# Patient Record
Sex: Male | Born: 1970 | Race: White | Hispanic: No | Marital: Married | Smoking: Never smoker
Health system: Southern US, Community
[De-identification: ages and names within clinical notes are randomized; demographics above are authoritative.]

## PROBLEM LIST (undated history)

## (undated) DIAGNOSIS — I10 Essential (primary) hypertension: Secondary | ICD-10-CM

## (undated) DIAGNOSIS — E079 Disorder of thyroid, unspecified: Secondary | ICD-10-CM

## (undated) DIAGNOSIS — M1711 Unilateral primary osteoarthritis, right knee: Secondary | ICD-10-CM

## (undated) DIAGNOSIS — K219 Gastro-esophageal reflux disease without esophagitis: Secondary | ICD-10-CM

## (undated) DIAGNOSIS — M1990 Unspecified osteoarthritis, unspecified site: Secondary | ICD-10-CM

## (undated) DIAGNOSIS — E291 Testicular hypofunction: Secondary | ICD-10-CM

## (undated) DIAGNOSIS — E785 Hyperlipidemia, unspecified: Secondary | ICD-10-CM

## (undated) DIAGNOSIS — I2510 Atherosclerotic heart disease of native coronary artery without angina pectoris: Secondary | ICD-10-CM

## (undated) DIAGNOSIS — R12 Heartburn: Secondary | ICD-10-CM

## (undated) HISTORY — DX: Disorder of thyroid, unspecified: E07.9

## (undated) HISTORY — DX: Testicular hypofunction: E29.1

## (undated) HISTORY — DX: Essential (primary) hypertension: I10

## (undated) HISTORY — DX: Atherosclerotic heart disease of native coronary artery without angina pectoris: I25.10

## (undated) HISTORY — PX: JOINT REPLACEMENT: SHX530

## (undated) HISTORY — DX: Unilateral primary osteoarthritis, right knee: M17.11

## (undated) HISTORY — DX: Heartburn: R12

## (undated) HISTORY — DX: Unspecified osteoarthritis, unspecified site: M19.90

## (undated) HISTORY — DX: Hyperlipidemia, unspecified: E78.5

---

## 2006-12-11 HISTORY — PX: KNEE ARTHROSCOPY W/ MENISCECTOMY: SHX1879

## 2007-10-03 ENCOUNTER — Ambulatory Visit: Payer: Self-pay | Admitting: Specialist

## 2009-12-11 HISTORY — PX: KNEE ARTHROSCOPY W/ MENISCECTOMY: SHX1879

## 2010-05-21 ENCOUNTER — Emergency Department: Payer: Self-pay | Admitting: Emergency Medicine

## 2010-05-26 ENCOUNTER — Ambulatory Visit: Payer: Self-pay | Admitting: Sports Medicine

## 2010-06-09 ENCOUNTER — Ambulatory Visit: Payer: Self-pay | Admitting: Orthopedic Surgery

## 2012-10-16 ENCOUNTER — Ambulatory Visit: Payer: Self-pay | Admitting: Orthopedic Surgery

## 2012-10-21 ENCOUNTER — Ambulatory Visit: Payer: Self-pay | Admitting: Orthopedic Surgery

## 2012-11-12 ENCOUNTER — Other Ambulatory Visit: Payer: Self-pay | Admitting: Physician Assistant

## 2012-11-12 ENCOUNTER — Encounter: Payer: Self-pay | Admitting: Physician Assistant

## 2012-11-12 DIAGNOSIS — E1159 Type 2 diabetes mellitus with other circulatory complications: Secondary | ICD-10-CM | POA: Insufficient documentation

## 2012-11-12 DIAGNOSIS — M1711 Unilateral primary osteoarthritis, right knee: Secondary | ICD-10-CM

## 2012-11-12 DIAGNOSIS — M170 Bilateral primary osteoarthritis of knee: Secondary | ICD-10-CM | POA: Insufficient documentation

## 2012-11-12 DIAGNOSIS — I10 Essential (primary) hypertension: Secondary | ICD-10-CM

## 2012-11-12 NOTE — H&P (Signed)
Kenneth Sherman is an 41 y.o. male.   Chief Complaint: right knee djd HPI: Kenneth Sherman is a 41 year old seen for follow-up from his persistent significant right knee pain. He is 2 years status post right knee arthroscopy for partial meniscectomy Dr. Menz in [place] but has had significant increasing pain. He was found on x-rays to have medial compartment bone on bone and an MRI done on 10/21/12 showed medial compartment bone on bone with no other significant bony or joint abnormality. He has pain on a daily basis. Pain with weightbearing and activity relieved by rest. He does not take any regular medications.   Past Medical History  Diagnosis Date  . Hypertension   . Right knee DJD     Past Surgical History  Procedure Date  . Knee arthroscopy w/ meniscectomy 2008    left  . Knee arthroscopy w/ meniscectomy 2011    right    Family History  Problem Relation Age of Onset  . Heart attack Mother   . Hypertension Mother   . Hypertension Father   . Hypertension Brother   . Heart attack Father   . Heart attack Father    Social History:  reports that he has never smoked. He does not have any smokeless tobacco history on file. He reports that he drinks alcohol. His drug history not on file.  Allergies: No Known Allergies   (Not in a hospital admission)  No results found for this or any previous visit (from the past 48 hour(s)). No results found.  Review of Systems  Constitutional: Negative.   HENT: Negative.   Eyes: Negative.   Respiratory: Negative.   Cardiovascular: Negative.   Gastrointestinal: Negative.   Genitourinary: Negative.   Musculoskeletal: Positive for joint pain.       Right knee pain  Skin: Negative.   Neurological: Negative.   Psychiatric/Behavioral: Negative.     Blood pressure 146/93, pulse 76, temperature 98.6 F (37 C), height 5' 11" (1.803 m), weight 116.121 kg (256 lb), SpO2 97.00%. Physical Exam  Constitutional: He is oriented to person, place, and  time. He appears well-developed and well-nourished.  HENT:  Head: Normocephalic and atraumatic.  Eyes: Conjunctivae normal are normal. Pupils are equal, round, and reactive to light.  Neck: Normal range of motion. Neck supple.  Cardiovascular: Normal rate and regular rhythm.   Respiratory: Effort normal.  GI: Soft.  Genitourinary:       Not pertinent to current symptomatology therefore not examined.  Musculoskeletal:       Well developed well nourished white male in no acute distress. Alert and oriented. Height 5'11" weight 248 pounds. Blood pressure is 156/92. Examination of his right knee reveals pain medially, 1+ crepitation 1+ synovitis pain on McMurray's testing. Mild varus deformity with normal patella tracking. Exam of the left knee reveals range of motion without pain swelling weakness or instability. Vascular exam: pulses 2+ and symmetric.  Neurological: He is alert and oriented to person, place, and time. He has normal reflexes.  Skin: Skin is warm and dry.  Psychiatric: He has a normal mood and affect. His behavior is normal. Judgment and thought content normal.     Assessment Patient Active Problem List  Diagnosis  . Hypertension  . Right knee DJD     Plan I talk to him and his wife about this in detail. I do think he's a candidate for right knee unicompartmental arthroplasty due to findings on x-rays MRI and lack of response to conservative care.   Discussed risks benefits and possible complications of the surgery in detail and he understands this completely.   Kynzleigh Bandel J 11/12/2012, 4:01 PM    

## 2012-11-14 ENCOUNTER — Ambulatory Visit (INDEPENDENT_AMBULATORY_CARE_PROVIDER_SITE_OTHER): Payer: BC Managed Care – PPO

## 2012-11-14 ENCOUNTER — Encounter: Payer: Self-pay | Admitting: Internal Medicine

## 2012-11-14 ENCOUNTER — Ambulatory Visit (INDEPENDENT_AMBULATORY_CARE_PROVIDER_SITE_OTHER): Payer: BC Managed Care – PPO | Admitting: Internal Medicine

## 2012-11-14 VITALS — BP 126/78 | HR 75 | Temp 98.6°F | Ht 71.0 in | Wt 252.0 lb

## 2012-11-14 DIAGNOSIS — Z862 Personal history of diseases of the blood and blood-forming organs and certain disorders involving the immune mechanism: Secondary | ICD-10-CM

## 2012-11-14 DIAGNOSIS — E119 Type 2 diabetes mellitus without complications: Secondary | ICD-10-CM

## 2012-11-14 DIAGNOSIS — Z0000 Encounter for general adult medical examination without abnormal findings: Secondary | ICD-10-CM

## 2012-11-14 DIAGNOSIS — Z8639 Personal history of other endocrine, nutritional and metabolic disease: Secondary | ICD-10-CM

## 2012-11-14 DIAGNOSIS — Z131 Encounter for screening for diabetes mellitus: Secondary | ICD-10-CM

## 2012-11-14 DIAGNOSIS — Z13220 Encounter for screening for lipoid disorders: Secondary | ICD-10-CM

## 2012-11-14 DIAGNOSIS — Z130 Encounter for screening for diseases of the blood and blood-forming organs and certain disorders involving the immune mechanism: Secondary | ICD-10-CM

## 2012-11-14 DIAGNOSIS — Z125 Encounter for screening for malignant neoplasm of prostate: Secondary | ICD-10-CM

## 2012-11-14 DIAGNOSIS — Z23 Encounter for immunization: Secondary | ICD-10-CM

## 2012-11-14 DIAGNOSIS — Z136 Encounter for screening for cardiovascular disorders: Secondary | ICD-10-CM

## 2012-11-14 LAB — CBC
MCV: 84.7 fl (ref 78.0–100.0)
Platelets: 288 10*3/uL (ref 150.0–400.0)
RBC: 5.13 Mil/uL (ref 4.22–5.81)

## 2012-11-14 LAB — LIPID PANEL
Cholesterol: 218 mg/dL — ABNORMAL HIGH (ref 0–200)
HDL: 41.1 mg/dL (ref >39.00)
Total CHOL/HDL Ratio: 5
Triglycerides: 89 mg/dL (ref 0.0–149.0)

## 2012-11-14 LAB — BASIC METABOLIC PANEL
Calcium: 9.3 mg/dL (ref 8.4–10.5)
GFR: 81.31 mL/min (ref >60.00)
Sodium: 139 mEq/L (ref 135–145)

## 2012-11-14 NOTE — Progress Notes (Signed)
HPI  Pt presents to the clinic today to establish care. He has no concerns other than he needs surgical clearance for a partial knee replacement on 11/29/2012 which will be performed by Dr. Thurston Hole in [place].  Past Medical History  Diagnosis Date  . Hypertension   . Right knee DJD     No current outpatient prescriptions on file.    No Known Allergies  Family History  Problem Relation Age of Onset  . Heart attack Mother   . Hypertension Mother   . Hypertension Father   . Hypertension Brother   . Heart attack Father   . Heart attack Father     History   Social History  . Marital Status: Married    Spouse Name: N/A    Number of Children: N/A  . Years of Education: 14   Occupational History  . CAD Designer    Social History Main Topics  . Smoking status: Never Smoker   . Smokeless tobacco: Not on file  . Alcohol Use: 1.2 oz/week    1 Cans of beer, 1 Glasses of wine per week     Comment: occasionally  . Drug Use: No  . Sexually Active: Yes    Birth Control/ Protection: Condom   Other Topics Concern  . Not on file   Social History Narrative   Regular exercise-noCaffeine Use-yes    ROS:  Constitutional: Denies fever, malaise, fatigue, headache or abrupt weight changes.  HEENT: Denies eye pain, eye redness, ear pain, ringing in the ears, wax buildup, runny nose, nasal congestion, bloody nose, or sore throat. Respiratory: Denies difficulty breathing, shortness of breath, cough or sputum production.   Cardiovascular: Denies chest pain, chest tightness, palpitations or swelling in the hands or feet.  Gastrointestinal: Denies abdominal pain, bloating, constipation, diarrhea or blood in the stool.  GU: Denies frequency, urgency, pain with urination, blood in urine, odor or discharge. Musculoskeletal: Pt reports right knee pain and swelling. Denies decrease in range of motion, difficulty with gait, muscle pain.  Skin: Denies redness, rashes, lesions or ulcercations.   Neurological: Denies dizziness, difficulty with memory, difficulty with speech or problems with balance and coordination.   No other specific complaints in a complete review of systems (except as listed in HPI above).  PE:  BP 126/78  Pulse 75  Temp 98.6 F (37 C) (Oral)  Ht 5\' 11"  (1.803 m)  Wt 252 lb (114.306 kg)  BMI 35.15 kg/m2  SpO2 96% Wt Readings from Last 3 Encounters:  11/14/12 252 lb (114.306 kg)  11/12/12 256 lb (116.121 kg)    General: Appears his stated age, overweight but well developed, well nourished in NAD. HEENT: Head: normal shape and size; Eyes: sclera white, no icterus, conjunctiva pink, PERRLA and EOMs intact; Ears: Tm's gray and intact, normal light reflex; Nose: mucosa pink and moist, septum midline; Throat/Mouth: Teeth present, mucosa pink and moist, no lesions or ulcerations noted.  Neck: Normal range of motion. Neck supple, trachea midline. No massses, lumps or thyromegaly present.  Cardiovascular: Normal rate and rhythm. S1,S2 noted.  No murmur, rubs or gallops noted. No JVD or BLE edema. No carotid bruits noted. Pulmonary/Chest: Normal effort and positive vesicular breath sounds. No respiratory distress. No wheezes, rales or ronchi noted.  Abdomen: Soft and nontender. Normal bowel sounds, no bruits noted. No distention or masses noted. Liver, spleen and kidneys non palpable. Musculoskeletal: Normal range of motion. Moderate swelling of the right knee noted. Crepitus with ROM of the right knee. No  difficulty with gait. Strength 5/5 bilaterally. Neurological: Alert and oriented. Cranial nerves II-XII intact. Coordination normal. +DTRs bilaterally. Psychiatric: Mood and affect normal. Behavior is normal. Judgment and thought content normal.     Assessment and Plan:  Preventative Health Maintenance:  Will obtain basic screening labs CBC, BMET, lipids, PSA and testosterone EKG today- normal Tdap vaccine today Pt declines vaccine Pt medically cleared  for surgery pending lab work.  RTC in 1 year or sooner if needed.

## 2012-11-14 NOTE — Patient Instructions (Signed)
Health Maintenance, Males A healthy lifestyle and preventative care can promote health and wellness.  Maintain regular health, dental, and eye exams.  Eat a healthy diet. Foods like vegetables, fruits, whole grains, low-fat dairy products, and lean protein foods contain the nutrients you need without too many calories. Decrease your intake of foods high in solid fats, added sugars, and salt. Get information about a proper diet from your caregiver, if necessary.  Regular physical exercise is one of the most important things you can do for your health. Most adults should get at least 150 minutes of moderate-intensity exercise (any activity that increases your heart rate and causes you to sweat) each week. In addition, most adults need muscle-strengthening exercises on 2 or more days a week.   Maintain a healthy weight. The body mass index (BMI) is a screening tool to identify possible weight problems. It provides an estimate of body fat based on height and weight. Your caregiver can help determine your BMI, and can help you achieve or maintain a healthy weight. For adults 20 years and older:  A BMI below 18.5 is considered underweight.  A BMI of 18.5 to 24.9 is normal.  A BMI of 25 to 29.9 is considered overweight.  A BMI of 30 and above is considered obese.  Maintain normal blood lipids and cholesterol by exercising and minimizing your intake of saturated fat. Eat a balanced diet with plenty of fruits and vegetables. Blood tests for lipids and cholesterol should begin at age 20 and be repeated every 5 years. If your lipid or cholesterol levels are high, you are over 50, or you are a high risk for heart disease, you may need your cholesterol levels checked more frequently.Ongoing high lipid and cholesterol levels should be treated with medicines, if diet and exercise are not effective.  If you smoke, find out from your caregiver how to quit. If you do not use tobacco, do not start.  If you  choose to drink alcohol, do not exceed 2 drinks per day. One drink is considered to be 12 ounces (355 mL) of beer, 5 ounces (148 mL) of wine, or 1.5 ounces (44 mL) of liquor.  Avoid use of street drugs. Do not share needles with anyone. Ask for help if you need support or instructions about stopping the use of drugs.  High blood pressure causes heart disease and increases the risk of stroke. Blood pressure should be checked at least every 1 to 2 years. Ongoing high blood pressure should be treated with medicines if weight loss and exercise are not effective.  If you are 45 to 42 years old, ask your caregiver if you should take aspirin to prevent heart disease.  Diabetes screening involves taking a blood sample to check your fasting blood sugar level. This should be done once every 3 years, after age 45, if you are within normal weight and without risk factors for diabetes. Testing should be considered at a younger age or be carried out more frequently if you are overweight and have at least 1 risk factor for diabetes.  Colorectal cancer can be detected and often prevented. Most routine colorectal cancer screening begins at the age of 50 and continues through age 75. However, your caregiver may recommend screening at an earlier age if you have risk factors for colon cancer. On a yearly basis, your caregiver may provide home test kits to check for hidden blood in the stool. Use of a small camera at the end of a tube,   to directly examine the colon (sigmoidoscopy or colonoscopy), can detect the earliest forms of colorectal cancer. Talk to your caregiver about this at age 50, when routine screening begins. Direct examination of the colon should be repeated every 5 to 10 years through age 75, unless early forms of pre-cancerous polyps or small growths are found.  Hepatitis C blood testing is recommended for all people born from 1945 through 1965 and any individual with known risks for hepatitis C.  Healthy  men should no longer receive prostate-specific antigen (PSA) blood tests as part of routine cancer screening. Consult with your caregiver about prostate cancer screening.  Testicular cancer screening is not recommended for adolescents or adult males who have no symptoms. Screening includes self-exam, caregiver exam, and other screening tests. Consult with your caregiver about any symptoms you have or any concerns you have about testicular cancer.  Practice safe sex. Use condoms and avoid high-risk sexual practices to reduce the spread of sexually transmitted infections (STIs).  Use sunscreen with a sun protection factor (SPF) of 30 or greater. Apply sunscreen liberally and repeatedly throughout the day. You should seek shade when your shadow is shorter than you. Protect yourself by wearing long sleeves, pants, a wide-brimmed hat, and sunglasses year round, whenever you are outdoors.  Notify your caregiver of new moles or changes in moles, especially if there is a change in shape or color. Also notify your caregiver if a mole is larger than the size of a pencil eraser.  A one-time screening for abdominal aortic aneurysm (AAA) and surgical repair of large AAAs by sound wave imaging (ultrasonography) is recommended for ages 65 to 75 years who are current or former smokers.  Stay current with your immunizations. Document Released: 05/25/2008 Document Revised: 02/19/2012 Document Reviewed: 04/24/2011 [employer] Patient Information 2013 [employer].  

## 2012-11-14 NOTE — Addendum Note (Signed)
Addended by: Brenton Grills C on: 11/14/2012 10:55 AM   Modules accepted: Orders

## 2012-11-15 ENCOUNTER — Encounter: Payer: Self-pay | Admitting: Internal Medicine

## 2012-11-15 LAB — TESTOSTERONE, FREE, TOTAL, SHBG
Sex Hormone Binding: 16 nmol/L (ref 13–71)
Testosterone: 211.92 ng/dL — ABNORMAL LOW (ref 300–890)

## 2012-11-15 LAB — HEMOGLOBIN A1C: Hgb A1c MFr Bld: 5.9 % (ref 4.6–6.5)

## 2012-11-18 ENCOUNTER — Other Ambulatory Visit: Payer: Self-pay | Admitting: Internal Medicine

## 2012-11-18 MED ORDER — TESTOSTERONE 20.25 MG/ACT (1.62%) TD GEL: 2.0000 | Freq: Every day | TRANSDERMAL | Status: DC

## 2012-11-18 NOTE — Progress Notes (Signed)
Testosterone low, androgel restarted. RX placed

## 2012-11-19 ENCOUNTER — Encounter (HOSPITAL_COMMUNITY): Payer: Self-pay | Admitting: Pharmacy Technician

## 2012-11-20 ENCOUNTER — Encounter (HOSPITAL_COMMUNITY)
Admission: RE | Admit: 2012-11-20 | Discharge: 2012-11-20 | Disposition: A | Discharge status: Home / Self Care | Payer: BC Managed Care – PPO | Source: Ambulatory Visit | Attending: Physician Assistant | Admitting: Physician Assistant

## 2012-11-20 ENCOUNTER — Encounter (HOSPITAL_COMMUNITY)
Admission: RE | Admit: 2012-11-20 | Discharge: 2012-11-20 | Disposition: A | Discharge status: Home / Self Care | Payer: BC Managed Care – PPO | Source: Ambulatory Visit | Attending: Orthopedic Surgery | Admitting: Orthopedic Surgery

## 2012-11-20 ENCOUNTER — Encounter (HOSPITAL_COMMUNITY): Payer: Self-pay

## 2012-11-20 LAB — COMPREHENSIVE METABOLIC PANEL
ALT: 24 U/L (ref 0–53)
AST: 22 U/L (ref 0–37)
Albumin: 4 g/dL (ref 3.5–5.2)
Alkaline Phosphatase: 79 U/L (ref 39–117)
Chloride: 103 mEq/L (ref 96–112)
Creatinine, Ser: 1.1 mg/dL (ref 0.50–1.35)
Potassium: 3.8 mEq/L (ref 3.5–5.1)
Sodium: 139 mEq/L (ref 135–145)
Total Bilirubin: 0.4 mg/dL (ref 0.3–1.2)

## 2012-11-20 LAB — CBC WITH DIFFERENTIAL/PLATELET
Basophils Absolute: 0 10*3/uL (ref 0.0–0.1)
Basophils Relative: 0 % (ref 0–1)
Lymphocytes Relative: 36 % (ref 12–46)
MCHC: 35.5 g/dL (ref 30.0–36.0)
Neutro Abs: 3.4 10*3/uL (ref 1.7–7.7)
Neutrophils Relative %: 51 % (ref 43–77)
Platelets: 252 10*3/uL (ref 150–400)
RDW: 13.1 % (ref 11.5–15.5)
WBC: 6.7 10*3/uL (ref 4.0–10.5)

## 2012-11-20 LAB — SURGICAL PCR SCREEN
MRSA, PCR: NEGATIVE
Staphylococcus aureus: NEGATIVE

## 2012-11-20 LAB — PROTIME-INR: INR: 0.99 (ref 0.00–1.49)

## 2012-11-20 LAB — URINALYSIS, ROUTINE W REFLEX MICROSCOPIC
Ketones, ur: NEGATIVE mg/dL
Leukocytes, UA: NEGATIVE
Nitrite: NEGATIVE
pH: 5 (ref 5.0–8.0)

## 2012-11-20 LAB — APTT: aPTT: 30 seconds (ref 24–37)

## 2012-11-20 LAB — TYPE AND SCREEN

## 2012-11-20 MED ORDER — CHLORHEXIDINE GLUCONATE 4 % EX LIQD: 60.0000 mL | Freq: Once | CUTANEOUS | Status: DC

## 2012-11-20 NOTE — Pre-Procedure Instructions (Signed)
20 Breckan Cafiero  11/20/2012   Your procedure is scheduled on:  11/29/12  Report to [facility] at 800 AM.  Call this number if you have problems the morning of surgery: [phone]   Remember:   Do not eat food:After Midnight.    Take these medicines the morning of surgery with A SIP OF WATER: none   Do not wear jewelry, make-up or nail polish.  Do not wear lotions, powders, or perfumes. You may wear deodorant.  Do not shave 48 hours prior to surgery. Men may shave face and neck.  Do not bring valuables to the hospital.  Contacts, dentures or bridgework may not be worn into surgery.  Leave suitcase in the car. After surgery it may be brought to your room.  For patients admitted to the hospital, checkout time is 11:00 AM the day of discharge.   Patients discharged the day of surgery will not be allowed to drive home.  Name and phone number of your driver: family  Special Instructions: Shower using CHG 2 nights before surgery and the night before surgery.  If you shower the day of surgery use CHG.  Use special wash - you have one bottle of CHG for all showers.  You should use approximately 1/3 of the bottle for each shower.   Please read over the following fact sheets that you were given: Pain Booklet, Coughing and Deep Breathing, Blood Transfusion Information, Total Joint Packet, MRSA Information and Surgical Site Infection Prevention

## 2012-11-21 LAB — URINE CULTURE

## 2012-11-28 MED ORDER — CEFAZOLIN SODIUM 10 G IJ SOLR
3.0000 g | INTRAMUSCULAR | Status: AC
  Administered 2012-11-29: 3 g via INTRAVENOUS
  Filled 2012-11-28: qty 3000

## 2012-11-28 NOTE — Progress Notes (Addendum)
Left voice mail message on pts phone re: surgery time change.  Informed him that he was to arrive at 0530 instead of  0800.  Instructed pt to call Sec A to verify receipt of message. Pt returned call and stated understanding of arrival time.

## 2012-11-29 ENCOUNTER — Inpatient Hospital Stay (HOSPITAL_COMMUNITY)
Admission: RE | Admit: 2012-11-29 | Discharge: 2012-11-30 | DRG: 209 | Disposition: A | Discharge status: Home Health | Payer: BC Managed Care – PPO | Source: Ambulatory Visit | Attending: Orthopedic Surgery | Admitting: Orthopedic Surgery

## 2012-11-29 ENCOUNTER — Ambulatory Visit (HOSPITAL_COMMUNITY): Payer: BC Managed Care – PPO | Admitting: Anesthesiology

## 2012-11-29 ENCOUNTER — Encounter (HOSPITAL_COMMUNITY): Payer: Self-pay | Admitting: Anesthesiology

## 2012-11-29 ENCOUNTER — Encounter (HOSPITAL_COMMUNITY)
Admission: RE | Disposition: A | Discharge status: Home Health | Payer: Self-pay | Source: Ambulatory Visit | Attending: Orthopedic Surgery

## 2012-11-29 DIAGNOSIS — M1710 Unilateral primary osteoarthritis, unspecified knee: Principal | ICD-10-CM | POA: Diagnosis present

## 2012-11-29 DIAGNOSIS — M170 Bilateral primary osteoarthritis of knee: Secondary | ICD-10-CM | POA: Diagnosis present

## 2012-11-29 DIAGNOSIS — I10 Essential (primary) hypertension: Secondary | ICD-10-CM | POA: Diagnosis present

## 2012-11-29 DIAGNOSIS — Z8249 Family history of ischemic heart disease and other diseases of the circulatory system: Secondary | ICD-10-CM

## 2012-11-29 DIAGNOSIS — M1711 Unilateral primary osteoarthritis, right knee: Secondary | ICD-10-CM

## 2012-11-29 DIAGNOSIS — E1159 Type 2 diabetes mellitus with other circulatory complications: Secondary | ICD-10-CM | POA: Diagnosis present

## 2012-11-29 HISTORY — PX: PARTIAL KNEE ARTHROPLASTY: SHX2174

## 2012-11-29 SURGERY — ARTHROPLASTY, KNEE, UNICOMPARTMENTAL
Anesthesia: General | Site: Knee | Laterality: Right | Wound class: Clean

## 2012-11-29 MED ORDER — ZOLPIDEM TARTRATE 5 MG PO TABS
5.0000 mg | ORAL_TABLET | Freq: Every evening | ORAL | Status: DC | PRN
  Administered 2012-11-29: 5 mg via ORAL
  Filled 2012-11-29: qty 1

## 2012-11-29 MED ORDER — MIDAZOLAM HCL 5 MG/5ML IJ SOLN
INTRAMUSCULAR | Status: DC | PRN
  Administered 2012-11-29: 2 mg via INTRAVENOUS

## 2012-11-29 MED ORDER — OXYCODONE HCL 5 MG PO TABS
5.0000 mg | ORAL_TABLET | ORAL | Status: DC | PRN
  Administered 2012-11-30: 10 mg via ORAL
  Filled 2012-11-29: qty 2

## 2012-11-29 MED ORDER — ALUM & MAG HYDROXIDE-SIMETH 200-200-20 MG/5ML PO SUSP: 30.0000 mL | ORAL | Status: DC | PRN

## 2012-11-29 MED ORDER — CHLORHEXIDINE GLUCONATE 4 % EX LIQD: 60.0000 mL | Freq: Once | CUTANEOUS | Status: DC

## 2012-11-29 MED ORDER — HYDROMORPHONE HCL PF 1 MG/ML IJ SOLN
INTRAMUSCULAR | Status: AC
  Filled 2012-11-29: qty 1

## 2012-11-29 MED ORDER — BUPIVACAINE-EPINEPHRINE 0.25% -1:200000 IJ SOLN
INTRAMUSCULAR | Status: DC | PRN
  Administered 2012-11-29: 30 mL

## 2012-11-29 MED ORDER — METOCLOPRAMIDE HCL 5 MG/ML IJ SOLN: 5.0000 mg | Freq: Three times a day (TID) | INTRAMUSCULAR | Status: DC | PRN

## 2012-11-29 MED ORDER — POTASSIUM CHLORIDE IN NACL 20-0.9 MEQ/L-% IV SOLN
INTRAVENOUS | Status: DC
  Administered 2012-11-29 – 2012-11-30 (×2): via INTRAVENOUS
  Filled 2012-11-29 (×4): qty 1000

## 2012-11-29 MED ORDER — CEFUROXIME SODIUM 1.5 G IV SOLR
INTRAVENOUS | Status: DC | PRN
  Administered 2012-11-29: 750 mg via INTRAVENOUS

## 2012-11-29 MED ORDER — PROPOFOL 10 MG/ML IV BOLUS
INTRAVENOUS | Status: DC | PRN
  Administered 2012-11-29: 100 mg via INTRAVENOUS
  Administered 2012-11-29: 200 mg via INTRAVENOUS
  Administered 2012-11-29: 100 mg via INTRAVENOUS

## 2012-11-29 MED ORDER — CELECOXIB 200 MG PO CAPS
200.0000 mg | ORAL_CAPSULE | Freq: Two times a day (BID) | ORAL | Status: DC
  Administered 2012-11-29 – 2012-11-30 (×3): 200 mg via ORAL
  Filled 2012-11-29 (×4): qty 1

## 2012-11-29 MED ORDER — DIPHENHYDRAMINE HCL 12.5 MG/5ML PO ELIX: 12.5000 mg | ORAL_SOLUTION | ORAL | Status: DC | PRN

## 2012-11-29 MED ORDER — PHENOL 1.4 % MT LIQD: 1.0000 | OROMUCOSAL | Status: DC | PRN

## 2012-11-29 MED ORDER — METOCLOPRAMIDE HCL 10 MG PO TABS: 5.0000 mg | ORAL_TABLET | Freq: Three times a day (TID) | ORAL | Status: DC | PRN

## 2012-11-29 MED ORDER — ONDANSETRON HCL 4 MG/2ML IJ SOLN: 4.0000 mg | Freq: Once | INTRAMUSCULAR | Status: DC | PRN

## 2012-11-29 MED ORDER — LACTATED RINGERS IV SOLN: INTRAVENOUS | Status: DC

## 2012-11-29 MED ORDER — ARTIFICIAL TEARS OP OINT
TOPICAL_OINTMENT | OPHTHALMIC | Status: DC | PRN
  Administered 2012-11-29: 1 via OPHTHALMIC

## 2012-11-29 MED ORDER — ONDANSETRON HCL 4 MG/2ML IJ SOLN: 4.0000 mg | Freq: Four times a day (QID) | INTRAMUSCULAR | Status: DC | PRN

## 2012-11-29 MED ORDER — DEXAMETHASONE SODIUM PHOSPHATE 10 MG/ML IJ SOLN
10.0000 mg | Freq: Every day | INTRAMUSCULAR | Status: DC
  Filled 2012-11-29 (×2): qty 1

## 2012-11-29 MED ORDER — EPHEDRINE SULFATE 50 MG/ML IJ SOLN
INTRAMUSCULAR | Status: DC | PRN
  Administered 2012-11-29: 10 mg via INTRAVENOUS

## 2012-11-29 MED ORDER — DEXAMETHASONE 6 MG PO TABS
10.0000 mg | ORAL_TABLET | Freq: Every day | ORAL | Status: DC
  Administered 2012-11-29 – 2012-11-30 (×2): 10 mg via ORAL
  Filled 2012-11-29 (×2): qty 1

## 2012-11-29 MED ORDER — DEXAMETHASONE 6 MG PO TABS
10.0000 mg | ORAL_TABLET | Freq: Three times a day (TID) | ORAL | Status: DC
  Filled 2012-11-29 (×3): qty 1

## 2012-11-29 MED ORDER — 0.9 % SODIUM CHLORIDE (POUR BTL) OPTIME
TOPICAL | Status: DC | PRN
  Administered 2012-11-29: 1000 mL

## 2012-11-29 MED ORDER — ONDANSETRON HCL 4 MG PO TABS: 4.0000 mg | ORAL_TABLET | Freq: Four times a day (QID) | ORAL | Status: DC | PRN

## 2012-11-29 MED ORDER — ENOXAPARIN SODIUM 30 MG/0.3ML ~~LOC~~ SOLN
30.0000 mg | Freq: Two times a day (BID) | SUBCUTANEOUS | Status: DC
  Administered 2012-11-30: 30 mg via SUBCUTANEOUS
  Filled 2012-11-29 (×3): qty 0.3

## 2012-11-29 MED ORDER — HYDROMORPHONE HCL PF 1 MG/ML IJ SOLN: 0.5000 mg | INTRAMUSCULAR | Status: DC | PRN

## 2012-11-29 MED ORDER — ACETAMINOPHEN 10 MG/ML IV SOLN
1000.0000 mg | Freq: Four times a day (QID) | INTRAVENOUS | Status: AC
  Administered 2012-11-29 – 2012-11-30 (×4): 1000 mg via INTRAVENOUS
  Filled 2012-11-29 (×4): qty 100

## 2012-11-29 MED ORDER — ACETAMINOPHEN 325 MG PO TABS: 650.0000 mg | ORAL_TABLET | Freq: Four times a day (QID) | ORAL | Status: DC | PRN

## 2012-11-29 MED ORDER — MENTHOL 3 MG MT LOZG: 1.0000 | LOZENGE | OROMUCOSAL | Status: DC | PRN

## 2012-11-29 MED ORDER — LIDOCAINE HCL (CARDIAC) 20 MG/ML IV SOLN
INTRAVENOUS | Status: DC | PRN
  Administered 2012-11-29: 100 mg via INTRAVENOUS

## 2012-11-29 MED ORDER — POVIDONE-IODINE 7.5 % EX SOLN
Freq: Once | CUTANEOUS | Status: DC
  Filled 2012-11-29: qty 118

## 2012-11-29 MED ORDER — DEXAMETHASONE SODIUM PHOSPHATE 10 MG/ML IJ SOLN
10.0000 mg | Freq: Three times a day (TID) | INTRAMUSCULAR | Status: DC
  Filled 2012-11-29 (×3): qty 1

## 2012-11-29 MED ORDER — ACETAMINOPHEN 650 MG RE SUPP: 650.0000 mg | Freq: Four times a day (QID) | RECTAL | Status: DC | PRN

## 2012-11-29 MED ORDER — HYDROMORPHONE HCL PF 1 MG/ML IJ SOLN
0.2500 mg | INTRAMUSCULAR | Status: DC | PRN
  Administered 2012-11-29 (×4): 0.5 mg via INTRAVENOUS

## 2012-11-29 MED ORDER — ONDANSETRON HCL 4 MG/2ML IJ SOLN
INTRAMUSCULAR | Status: DC | PRN
  Administered 2012-11-29: 4 mg via INTRAVENOUS

## 2012-11-29 MED ORDER — CEFAZOLIN SODIUM-DEXTROSE 2-3 GM-% IV SOLR
2.0000 g | Freq: Four times a day (QID) | INTRAVENOUS | Status: AC
  Administered 2012-11-29 (×2): 2 g via INTRAVENOUS
  Filled 2012-11-29 (×2): qty 50

## 2012-11-29 MED ORDER — BISACODYL 5 MG PO TBEC
10.0000 mg | DELAYED_RELEASE_TABLET | Freq: Every day | ORAL | Status: DC
  Administered 2012-11-29: 10 mg via ORAL
  Filled 2012-11-29: qty 2

## 2012-11-29 MED ORDER — DOCUSATE SODIUM 100 MG PO CAPS
100.0000 mg | ORAL_CAPSULE | Freq: Two times a day (BID) | ORAL | Status: DC
  Administered 2012-11-29 – 2012-11-30 (×3): 100 mg via ORAL
  Filled 2012-11-29 (×3): qty 1

## 2012-11-29 MED ORDER — LACTATED RINGERS IV SOLN
INTRAVENOUS | Status: DC | PRN
  Administered 2012-11-29 (×2): via INTRAVENOUS

## 2012-11-29 MED ORDER — FENTANYL CITRATE 0.05 MG/ML IJ SOLN
INTRAMUSCULAR | Status: DC | PRN
  Administered 2012-11-29: 150 ug via INTRAVENOUS
  Administered 2012-11-29: 100 ug via INTRAVENOUS

## 2012-11-29 MED ORDER — CEFUROXIME SODIUM 750 MG IJ SOLR
INTRAMUSCULAR | Status: AC
  Filled 2012-11-29: qty 750

## 2012-11-29 MED ORDER — BUPIVACAINE-EPINEPHRINE PF 0.25-1:200000 % IJ SOLN
INTRAMUSCULAR | Status: AC
  Filled 2012-11-29: qty 30

## 2012-11-29 MED FILL — Cefuroxime Sodium For Inj 1.5 GM: INTRAMUSCULAR | Qty: 1.5 | Status: AC

## 2012-11-29 SURGICAL SUPPLY — 65 items
BANDAGE ELASTIC 4 VELCRO ST LF (GAUZE/BANDAGES/DRESSINGS) ×2 IMPLANT
BANDAGE ELASTIC 6 VELCRO ST LF (GAUZE/BANDAGES/DRESSINGS) ×2 IMPLANT
BANDAGE ESMARK 6X9 LF (GAUZE/BANDAGES/DRESSINGS) ×1 IMPLANT
BNDG ESMARK 6X9 LF (GAUZE/BANDAGES/DRESSINGS) ×2
BOWL SMART MIX CTS (DISPOSABLE) ×2 IMPLANT
CEMENT HV SMART SET (Cement) ×2 IMPLANT
CLOTH BEACON ORANGE TIMEOUT ST (SAFETY) ×2 IMPLANT
COVER BACK TABLE 24X17X13 BIG (DRAPES) IMPLANT
COVER SURGICAL LIGHT HANDLE (MISCELLANEOUS) ×2 IMPLANT
CUFF TOURNIQUET SINGLE 34IN LL (TOURNIQUET CUFF) ×2 IMPLANT
CUFF TOURNIQUET SINGLE 44IN (TOURNIQUET CUFF) IMPLANT
DRAPE EXTREMITY T 121X128X90 (DRAPE) ×2 IMPLANT
DRAPE INCISE IOBAN 66X45 STRL (DRAPES) IMPLANT
DRAPE PROXIMA HALF (DRAPES) ×2 IMPLANT
DRAPE U-SHAPE 47X51 STRL (DRAPES) ×2 IMPLANT
DRSG ADAPTIC 3X8 NADH LF (GAUZE/BANDAGES/DRESSINGS) ×2 IMPLANT
DRSG PAD ABDOMINAL 8X10 ST (GAUZE/BANDAGES/DRESSINGS) ×4 IMPLANT
DURAPREP 26ML APPLICATOR (WOUND CARE) ×2 IMPLANT
ELECT CAUTERY BLADE 6.4 (BLADE) ×2 IMPLANT
ELECT REM PT RETURN 9FT ADLT (ELECTROSURGICAL) ×2
ELECTRODE REM PT RTRN 9FT ADLT (ELECTROSURGICAL) ×1 IMPLANT
EVACUATOR 1/8 PVC DRAIN (DRAIN) IMPLANT
FACESHIELD LNG OPTICON STERILE (SAFETY) ×2 IMPLANT
GLOVE BIO SURGEON STRL SZ7 (GLOVE) ×2 IMPLANT
GLOVE BIOGEL PI IND STRL 7.0 (GLOVE) ×1 IMPLANT
GLOVE BIOGEL PI IND STRL 7.5 (GLOVE) ×1 IMPLANT
GLOVE BIOGEL PI IND STRL 8 (GLOVE) IMPLANT
GLOVE BIOGEL PI INDICATOR 7.0 (GLOVE) ×1
GLOVE BIOGEL PI INDICATOR 7.5 (GLOVE) ×1
GLOVE BIOGEL PI INDICATOR 8 (GLOVE)
GLOVE BIOGEL PI ORTHO PRO 7.5 (GLOVE)
GLOVE PI ORTHO PRO STRL 7.5 (GLOVE) IMPLANT
GLOVE SS BIOGEL STRL SZ 7.5 (GLOVE) ×1 IMPLANT
GLOVE SUPERSENSE BIOGEL SZ 7.5 (GLOVE) ×1
GOWN PREVENTION PLUS XLARGE (GOWN DISPOSABLE) ×4 IMPLANT
GOWN STRL NON-REIN LRG LVL3 (GOWN DISPOSABLE) ×4 IMPLANT
GOWN STRL REIN XL XLG (GOWN DISPOSABLE) ×2 IMPLANT
HANDPIECE INTERPULSE COAX TIP (DISPOSABLE) ×1
HOOD PEEL AWAY FACE SHEILD DIS (HOOD) ×6 IMPLANT
IMMOBILIZER KNEE 22 UNIV (SOFTGOODS) ×2 IMPLANT
KIT BASIN OR (CUSTOM PROCEDURE TRAY) ×2 IMPLANT
KIT ROOM TURNOVER OR (KITS) ×2 IMPLANT
KIT SAW BLADE (KITS) ×2 IMPLANT
MANIFOLD NEPTUNE II (INSTRUMENTS) ×2 IMPLANT
NS IRRIG 1000ML POUR BTL (IV SOLUTION) ×2 IMPLANT
PACK TOTAL JOINT (CUSTOM PROCEDURE TRAY) ×2 IMPLANT
PAD ARMBOARD 7.5X6 YLW CONV (MISCELLANEOUS) ×4 IMPLANT
PAD CAST 4YDX4 CTTN HI CHSV (CAST SUPPLIES) ×1 IMPLANT
PADDING CAST COTTON 4X4 STRL (CAST SUPPLIES) ×1
PADDING CAST COTTON 6X4 STRL (CAST SUPPLIES) ×2 IMPLANT
RUBBERBAND STERILE (MISCELLANEOUS) ×2 IMPLANT
SET HNDPC FAN SPRY TIP SCT (DISPOSABLE) ×1 IMPLANT
SPONGE GAUZE 4X4 12PLY (GAUZE/BANDAGES/DRESSINGS) ×2 IMPLANT
STRIP CLOSURE SKIN 1/2X4 (GAUZE/BANDAGES/DRESSINGS) ×2 IMPLANT
SUCTION FRAZIER TIP 10 FR DISP (SUCTIONS) ×2 IMPLANT
SUT ETHIBOND NAB CT1 #1 30IN (SUTURE) ×4 IMPLANT
SUT MNCRL AB 3-0 PS2 18 (SUTURE) ×2 IMPLANT
SUT VIC AB 0 CT1 27 (SUTURE) ×2
SUT VIC AB 0 CT1 27XBRD ANBCTR (SUTURE) ×2 IMPLANT
SUT VIC AB 2-0 CT1 27 (SUTURE) ×2
SUT VIC AB 2-0 CT1 TAPERPNT 27 (SUTURE) ×2 IMPLANT
TOWEL OR 17X24 6PK STRL BLUE (TOWEL DISPOSABLE) ×2 IMPLANT
TOWEL OR 17X26 10 PK STRL BLUE (TOWEL DISPOSABLE) ×2 IMPLANT
TRAY FOLEY CATH 14FR (SET/KITS/TRAYS/PACK) IMPLANT
WATER STERILE IRR 1000ML POUR (IV SOLUTION) ×6 IMPLANT

## 2012-11-29 NOTE — Preoperative (Signed)
Beta Blockers   Reason not to administer Beta Blockers:Not Applicable 

## 2012-11-29 NOTE — Plan of Care (Signed)
Problem: Phase I Progression Outcomes Goal: Dangle or out of bed evening of surgery Outcome: Completed/Met Date Met:  11/29/12 Pt ambulated 20 feet with PT.

## 2012-11-29 NOTE — Op Note (Signed)
NAME:  Kenneth Sherman, Kenneth Sherman NO.:  [id number]  MEDICAL RECORD NO.:  [id number]  LOCATION:  [facility]                         FACILITY:  [facility]  PHYSICIAN:  Bethaney Oshana A. Thurston Hole, M.D. DATE OF BIRTH:  1971-09-13  DATE OF PROCEDURE:  11/29/2012 DATE OF DISCHARGE:                              OPERATIVE REPORT   PREOPERATIVE DIAGNOSIS:  Right knee medial compartment degenerative joint disease.  POSTOPERATIVE DIAGNOSIS:  Right knee medial compartment degenerative joint disease.  PROCEDURE: 1. Right knee unicompartmental Oxford arthroplasty using size medium     cemented femoral component with size D cemented tibial component     with #3 polyethylene insert. 2. Zinacef impregnated cement.  SURGEON:  Elana Alm. Thurston Hole, MD  ASSISTANT:  Julien Girt, PA-C.  ANESTHESIA:  General.  OPERATIVE TIME:  One hour and 20 minutes.  COMPLICATIONS:  None.  DESCRIPTION OF PROCEDURE:  Mr. Kenneth Sherman is brought to the operating room on November 29, 2012 after femoral nerve block was placed in the holding room by anesthesia.  He was placed on operative table in supine position.  He received Ancef 2 g IV preoperatively for prophylaxis. After being placed under general anesthesia, his right knee was examined.  He had full range of motion, mild varus deformity, knee stable, ligamentous exam with normal patellar tracking.  He had a Foley catheter placed under sterile conditions.  The right leg was prepped using sterile DuraPrep and draped using sterile technique.  Time-out procedure was called and the correct right knee identified.  Right leg was exsanguinated and a thigh tourniquet elevated to 365 mm.  Initially, through a 7 cm anterior and medial incision just medial to the patellar tendon, initial exposure was made.  The underlying subcutaneous tissues were incised along with skin incision.  Median arthrotomy was performed revealing an excessive amount of normal-appearing joint fluid.   The articular surfaces were inspected.  He had grade 4 changes medially.  No articular cartilage changes laterally, grade 3 changes in the patellofemoral joint.  The ACL, PCL were intact.  The medial meniscus was resected.  A medium sizing spoon was then placed.  This gave excellent fit and then the proximal tibial cut was made based off this medial sizing spoon, resecting 7 mm off of the sizing spoon.  The proximal tibial cut was made without complications and then it was sized to size D with the appropriate size.  After this was done, intramedullary drill was drilled up in the femoral canal and the distal femoral jig was placed in length to this intramedullary rod and then 2 drill holes were made in the central portion of the medial femoral condyle and then the 0 spigot was placed and initial femoral reaming was carried out.  After this was done with tibial trial in place and the medium femoral trial, a size 3 paddle was used in flexion, gave excellent fit, but in extension only 1 mm paddle would fit and thus a #2 spigot was placed.  Further femoral reaming was carried out.  After this was done, then the trials were placed again and #3 polyethylene trial gave excellent sizing and fit through a full range of motion.  At  this point, the toothbrush saw was used to cut the tibial keel and then the femoral impingement guide was placed in this drilling and reaming was carried out.  After this was done, the wound was irrigated.  The medial meniscus remnant had been removed.  At this point, the trials were placed back into place and again the #3 polyethylene spacer was placed. Knee taken through full range of motion and found to be stable with excellent tracking.  At this point, the trials were removed.  The wound was irrigated and then the size D tibial component with Zinacef impregnated cement backing was hammered into place with an excellent fit with excess cement being removed from around  the edges.  The medium femoral component with cement backing was hammered in position also with an excellent fit with excess cement being removed from around the edges. A #3 polyethylene spacer was then placed, knee taken through full range of motion and found to be stable with excellent tracking and excellent correction of his varus deformity.  At this point, it is felt that all the components were of excellent size, fit, and stability.  The wound was further irrigated and the arthrotomy was closed with a running #2 Ethibond suture over 1 medium Hemovac drains.  Subcutaneous tissues closed with 0 and 2-0 Vicryl, subcuticular layer closed with 4-0 Monocryl.  Sterile dressings were applied.  Long-leg splint.  Tourniquet was released and the patient awakened and taken to recovery room in stable condition.  Needle and sponge counts were correct x2 at the end of the case.     Kenneth Sherman A. Thurston Hole, M.D.     RAW/MEDQ  D:  11/29/2012  T:  11/29/2012  Job:  [id number]

## 2012-11-29 NOTE — Plan of Care (Signed)
Problem: Consults Goal: Diagnosis- Total Joint Replacement Outcome: Completed/Met Date Met:  11/29/12 Partial/Uniknee Right

## 2012-11-29 NOTE — Anesthesia Preprocedure Evaluation (Addendum)
Anesthesia Evaluation  Patient identified by MRN, date of birth, ID band Patient awake    Reviewed: Allergy & Precautions, H&P , NPO status , Patient's Chart, lab work & pertinent test results  Airway Mallampati: I TM Distance: >3 FB Neck ROM: full    Dental  (+) Teeth Intact and Dental Advisory Given   Pulmonary  breath sounds clear to auscultation        Cardiovascular hypertension, Rhythm:regular Rate:Normal     Neuro/Psych    GI/Hepatic   Endo/Other    Renal/GU      Musculoskeletal   Abdominal   Peds  Hematology   Anesthesia Other Findings   Reproductive/Obstetrics                          Anesthesia Physical Anesthesia Plan  ASA: II  Anesthesia Plan: General   Post-op Pain Management:    Induction: Intravenous  Airway Management Planned: Oral ETT and LMA  Additional Equipment:   Intra-op Plan:   Post-operative Plan: Extubation in OR  Informed Consent: I have reviewed the patients History and Physical, chart, labs and discussed the procedure including the risks, benefits and alternatives for the proposed anesthesia with the patient or authorized representative who has indicated his/her understanding and acceptance.     Plan Discussed with: CRNA, Anesthesiologist and Surgeon  Anesthesia Plan Comments:         Anesthesia Quick Evaluation

## 2012-11-29 NOTE — Evaluation (Signed)
Physical Therapy Evaluation Patient Details Name: Kenneth Sherman MRN: [id number] DOB: November 23, 1971 Today's Date: 11/29/2012 Time: 3664-4034 PT Time Calculation (min): 41 min  PT Assessment / Plan / Recommendation Clinical Impression  Pt is a 41 y/o male s/p uni knee replacement.  Pt will benefit from acute PT intervention to promote functional mobility for d/c to home.        PT Assessment  Patient needs continued PT services    Follow Up Recommendations  Home health PT    Barriers to Discharge None      Equipment Recommendations  Rolling walker with 5" wheels;Other (comment) (3 in 1 )    Recommendations for Other Services     Frequency 7X/week    Precautions / Restrictions Precautions Precautions: Knee Precaution Comments: Educated pt in Terminal knee extension positioning.  Required Braces or Orthoses: Knee Immobilizer - Right Knee Immobilizer - Right: On except when in CPM Restrictions Weight Bearing Restrictions: Yes RLE Weight Bearing: Weight bearing as tolerated   Pertinent Vitals/Pain 2/10 pain in R knee.  No intervention required.       Mobility  Bed Mobility Bed Mobility: Supine to Sit;Sitting - Scoot to Edge of Bed Supine to Sit: 5: Supervision;HOB flat;With rails Sitting - Scoot to Edge of Bed: 5: Supervision Details for Bed Mobility Assistance: supervision for safety cues for technique.  Transfers Transfers: Sit to Stand;Stand to Sit Sit to Stand: 4: Min guard;From elevated surface;From bed;Without upper extremity assist Stand to Sit: 4: Min guard;To chair/3-in-1;With upper extremity assist Details for Transfer Assistance: Verbal cueing for hand and RLE placement.  Ambulation/Gait Ambulation/Gait Assistance: 5: Supervision Ambulation Distance (Feet): 20 Feet Assistive device: Rolling walker Ambulation/Gait Assistance Details: Supervision for safety. Verbal cues for gait sequencing and use of RW.  Cued pt to decrease step length.   Gait Pattern: Step-to  pattern General Gait Details: Pt reports knee feeling unstable. Explained the effect of the nerve block to pt and spouse.  Stairs: No    Shoulder Instructions     Exercises Total Joint Exercises Ankle Circles/Pumps: Both;10 reps;Seated   PT Diagnosis: Difficulty walking;Generalized weakness;Acute pain  PT Problem List: Decreased strength;Decreased activity tolerance;Decreased range of motion;Decreased mobility;Decreased knowledge of use of DME;Pain PT Treatment Interventions: DME instruction;Gait training;Stair training;Functional mobility training;Therapeutic activities;Therapeutic exercise;Patient/family education   PT Goals Acute Rehab PT Goals PT Goal Formulation: With patient Time For Goal Achievement: 12/06/12 Potential to Achieve Goals: Good Pt will go Supine/Side to Sit: with modified independence PT Goal: Supine/Side to Sit - Progress: Goal set today Pt will go Sit to Supine/Side: with modified independence PT Goal: Sit to Supine/Side - Progress: Goal set today Pt will go Sit to Stand: with modified independence PT Goal: Sit to Stand - Progress: Goal set today Pt will go Stand to Sit: with modified independence PT Goal: Stand to Sit - Progress: Goal set today Pt will Transfer Bed to Chair/Chair to Bed: with modified independence PT Transfer Goal: Bed to Chair/Chair to Bed - Progress: Goal set today Pt will Ambulate: >150 feet;with modified independence;with least restrictive assistive device PT Goal: Ambulate - Progress: Goal set today Pt will Go Up / Down Stairs: 3-5 stairs;with supervision;with least restrictive assistive device PT Goal: Up/Down Stairs - Progress: Goal set today Pt will Perform Home Exercise Program: Independently PT Goal: Perform Home Exercise Program - Progress: Goal set today  Visit Information  Last PT Received On: 11/29/12    Subjective Data  Subjective: Agree to Eval   Prior Functioning  Home  Living Lives With: Spouse;Family;Son Available  Help at Discharge: Family;Available 24 hours/day Type of Home: House Home Access: Stairs to enter [employer] of Steps: 5 Entrance Stairs-Rails: Right;Left;Can reach both Home Layout: One level Bathroom Shower/Tub: Forensic scientist: Standard Home Adaptive Equipment: Crutches Prior Function Level of Independence: Independent Able to Take Stairs?: Yes Driving: Yes Vocation: Full time employment Communication Communication: No difficulties    Cognition  Overall Cognitive Status: Appears within functional limits for tasks assessed/performed Arousal/Alertness: Awake/alert Orientation Level: Appears intact for tasks assessed Behavior During Session: [facility] for tasks performed    Extremity/Trunk Assessment Right Lower Extremity Assessment RLE ROM/Strength/Tone: Unable to fully assess Left Lower Extremity Assessment LLE ROM/Strength/Tone: Within functional levels   Balance    End of Session PT - End of Session Equipment Utilized During Treatment: Gait belt;Right knee immobilizer Activity Tolerance: Patient tolerated treatment well Patient left: in chair;with call bell/phone within reach;with family/visitor present Nurse Communication: Mobility status CPM Right Knee CPM Right Knee: Off Additional Comments: CPM off at 1730  GP     Ady Heimann 11/29/2012, 6:15 PM  Esteen Delpriore L. Kyonna Frier DPT [phone]

## 2012-11-29 NOTE — Progress Notes (Signed)
[facility]  Patient Status: New  [facility] is providing the following services: PT - pt may go home on Saturday, is on the schedule for a PT visit at home on Sunday Referral from MD office - thank you  If patient discharges after hours, please call [phone].   Jodene Nam 11/29/2012, 11:43 AM

## 2012-11-29 NOTE — Progress Notes (Signed)
Orthopedic Tech Progress Note Patient Details:  Kenneth Sherman January 12, 1971 [id number]  CPM Right Knee CPM Right Knee: On Right Knee Flexion (Degrees): 60  Right Knee Extension (Degrees): 0  Additional Comments: trapeze bar   Cammer, Mickie Bail 11/29/2012, 12:34 PM

## 2012-11-29 NOTE — Anesthesia Postprocedure Evaluation (Signed)
  Anesthesia Post-op Note  Patient: Kenneth Sherman  Procedure(s) Performed: Procedure(s) (LRB) with comments: UNICOMPARTMENTAL KNEE (Right)  Patient Location: PACU  Anesthesia Type:General  Level of Consciousness: awake, alert , oriented and patient cooperative  Airway and Oxygen Therapy: Patient Spontanous Breathing  Post-op Pain: mild  Post-op Assessment: Post-op Vital signs reviewed, Patient's Cardiovascular Status Stable, Respiratory Function Stable, Patent Airway, No signs of Nausea or vomiting and Pain level controlled  Post-op Vital Signs: stable  Complications: No apparent anesthesia complications

## 2012-11-29 NOTE — Interval H&P Note (Signed)
History and Physical Interval Note:  11/29/2012 7:06 AM  Kenneth Sherman  has presented today for surgery, with the diagnosis of DJD RIGHT KNEE  The various methods of treatment have been discussed with the patient and family. After consideration of risks, benefits and other options for treatment, the patient has consented to  Procedure(s) (LRB) with comments: UNICOMPARTMENTAL KNEE (Right) as a surgical intervention .  The patient's history has been reviewed, patient examined, no change in status, stable for surgery.  I have reviewed the patient's chart and labs.  Questions were answered to the patient's satisfaction.     Salvatore Marvel A

## 2012-11-29 NOTE — Anesthesia Procedure Notes (Addendum)
Anesthesia Regional Block:  Femoral nerve block  Pre-Anesthetic Checklist: ,, timeout performed, Correct Patient, Correct Site, Correct Laterality, Correct Procedure, Correct Position, site marked, Risks and benefits discussed,  Surgical consent,  Pre-op evaluation,  At surgeon's request and post-op pain management  Laterality: Right  Prep: Maximum Sterile Barrier Precautions used, chloraprep and alcohol swabs       Needles:  Injection technique: Single-shot  Needle Type: Stimulator Needle - 80        Needle insertion depth: 6 cm   Additional Needles:  Procedures: nerve stimulator Femoral nerve block  Nerve Stimulator or Paresthesia:  Response: 0.5 mA, 0.1 ms, 6 cm  Additional Responses:   Narrative:  Start time: 11/29/2012 7:10 AM End time: 11/29/2012 7:15 AM Injection made incrementally with aspirations every 5 mL.  Performed by: Personally  Anesthesiologist: Maren Beach MD  Additional Notes: Pt accepts procedure and risks. 25cc 0.5% Marcaine w/ epi w/o discomfort or difficulty. Feliberto Gottron MD   Procedure Name: LMA Insertion Date/Time: 11/29/2012 7:31 AM Performed by: De Nurse Pre-anesthesia Checklist: Patient identified, Timeout performed, Emergency Drugs available, Suction available and Patient being monitored Patient Re-evaluated:Patient Re-evaluated prior to inductionOxygen Delivery Method: Circle system utilized Preoxygenation: Pre-oxygenation with 100% oxygen Intubation Type: IV induction Ventilation: Mask ventilation without difficulty LMA: LMA inserted LMA Size: 4.0 Number of attempts: 1 Placement Confirmation: positive ETCO2 and breath sounds checked- equal and bilateral Tube secured with: Tape Dental Injury: Teeth and Oropharynx as per pre-operative assessment     Procedure Name: Intubation Date/Time: 11/29/2012 8:16 AM Performed by: Jefm Miles E Pre-anesthesia Checklist: Patient identified, Timeout performed, Emergency Drugs  available, Suction available and Patient being monitored Patient Re-evaluated:Patient Re-evaluated prior to inductionOxygen Delivery Method: Circle system utilized Preoxygenation: Pre-oxygenation with 100% oxygen Intubation Type: IV induction Laryngoscope Size: Mac and 3 Grade View: Grade II Tube type: Oral Tube size: 7.5 mm Number of attempts: 2 Airway Equipment and Method: Bougie stylet Placement Confirmation: positive ETCO2,  breath sounds checked- equal and bilateral and ETT inserted through vocal cords under direct vision Secured at: 23 cm Tube secured with: Tape Dental Injury: Teeth and Oropharynx as per pre-operative assessment  Comments: Dr Katrinka Blazing intubated x 1 no ETCO2 observed, Dr. Katrinka Blazing intubated on second attempt with Bougie, good tidal volumes and ETCO2.

## 2012-11-29 NOTE — H&P (View-Only) (Signed)
Kenneth Sherman is an 41 y.o. male.   Chief Complaint: right knee djd HPI: Kenneth Sherman is a 41 year old seen for follow-up from his persistent significant right knee pain. He is 2 years status post right knee arthroscopy for partial meniscectomy Dr. Rosita Kea in [place] but has had significant increasing pain. He was found on x-rays to have medial compartment bone on bone and an MRI done on 10/21/12 showed medial compartment bone on bone with no other significant bony or joint abnormality. He has pain on a daily basis. Pain with weightbearing and activity relieved by rest. He does not take any regular medications.   Past Medical History  Diagnosis Date  . Hypertension   . Right knee DJD     Past Surgical History  Procedure Date  . Knee arthroscopy w/ meniscectomy 2008    left  . Knee arthroscopy w/ meniscectomy 2011    right    Family History  Problem Relation Age of Onset  . Heart attack Mother   . Hypertension Mother   . Hypertension Father   . Hypertension Brother   . Heart attack Father   . Heart attack Father    Social History:  reports that he has never smoked. He does not have any smokeless tobacco history on file. He reports that he drinks alcohol. His drug history not on file.  Allergies: No Known Allergies   (Not in a hospital admission)  No results found for this or any previous visit (from the past 48 hour(s)). No results found.  Review of Systems  Constitutional: Negative.   HENT: Negative.   Eyes: Negative.   Respiratory: Negative.   Cardiovascular: Negative.   Gastrointestinal: Negative.   Genitourinary: Negative.   Musculoskeletal: Positive for joint pain.       Right knee pain  Skin: Negative.   Neurological: Negative.   Psychiatric/Behavioral: Negative.     Blood pressure 146/93, pulse 76, temperature 98.6 F (37 C), height 5\' 11"  (1.803 m), weight 116.121 kg (256 lb), SpO2 97.00%. Physical Exam  Constitutional: He is oriented to person, place, and  time. He appears well-developed and well-nourished.  HENT:  Head: Normocephalic and atraumatic.  Eyes: Conjunctivae normal are normal. Pupils are equal, round, and reactive to light.  Neck: Normal range of motion. Neck supple.  Cardiovascular: Normal rate and regular rhythm.   Respiratory: Effort normal.  GI: Soft.  Genitourinary:       Not pertinent to current symptomatology therefore not examined.  Musculoskeletal:       Well developed well nourished white male in no acute distress. Alert and oriented. Height 5'11" weight 248 pounds. Blood pressure is 156/92. Examination of his right knee reveals pain medially, 1+ crepitation 1+ synovitis pain on McMurray's testing. Mild varus deformity with normal patella tracking. Exam of the left knee reveals range of motion without pain swelling weakness or instability. Vascular exam: pulses 2+ and symmetric.  Neurological: He is alert and oriented to person, place, and time. He has normal reflexes.  Skin: Skin is warm and dry.  Psychiatric: He has a normal mood and affect. His behavior is normal. Judgment and thought content normal.     Assessment Patient Active Problem List  Diagnosis  . Hypertension  . Right knee DJD     Plan I talk to him and his wife about this in detail. I do think he's a candidate for right knee unicompartmental arthroplasty due to findings on x-rays MRI and lack of response to conservative care.  Discussed risks benefits and possible complications of the surgery in detail and he understands this completely.   Keyshon Stein J 11/12/2012, 4:01 PM

## 2012-11-29 NOTE — Brief Op Note (Signed)
11/29/2012  9:04 AM  PATIENT:  Kenneth Sherman  41 y.o. male  PRE-OPERATIVE DIAGNOSIS:  DJD RIGHT KNEE  POST-OPERATIVE DIAGNOSIS:  degenerative joint disease right knee  PROCEDURE:  Procedure(s) (LRB) with comments: UNICOMPARTMENTAL KNEE (Right)  SURGEON:  Surgeon(s) and Role:    * Nilda Simmer, MD - Primary  PHYSICIAN ASSISTANT: Kirstin Shepperson PA-C  ASSISTANTS: Kirstin Shepperson PA-C   ANESTHESIA:   general  EBL:  Total I/O In: 1000 [I.V.:1000] Out: 250 [Urine:200; Blood:50]  BLOOD ADMINISTERED:none  DRAINS: (1) Hemovact drain(s) in the right knee with  Suction Clamped   LOCAL MEDICATIONS USED:  NONE  SPECIMEN:  No Specimen  DISPOSITION OF SPECIMEN:  N/A  COUNTS:  YES  TOURNIQUET:  * Missing tourniquet times found for documented tourniquets in log:  [id number] *  DICTATION: .Note written in EPIC  PLAN OF CARE: Admit to inpatient   PATIENT DISPOSITION:  PACU - hemodynamically stable.   Delay start of Pharmacological VTE agent (>24hrs) due to surgical blood loss or risk of bleeding: no

## 2012-11-29 NOTE — Transfer of Care (Signed)
Immediate Anesthesia Transfer of Care Note  Patient: Kenneth Sherman  Procedure(s) Performed: Procedure(s) (LRB) with comments: UNICOMPARTMENTAL KNEE (Right)  Patient Location: PACU  Anesthesia Type:General  Level of Consciousness: oriented, patient cooperative, lethargic and responds to stimulation  Airway & Oxygen Therapy: Patient Spontanous Breathing and Patient connected to nasal cannula oxygen  Post-op Assessment: Report given to PACU RN  Post vital signs: Reviewed and stable  Complications: No apparent anesthesia complications

## 2012-11-30 LAB — CBC
MCH: 27.4 pg (ref 26.0–34.0)
Platelets: 203 10*3/uL (ref 150–400)
RBC: 4.56 MIL/uL (ref 4.22–5.81)
RDW: 13.3 % (ref 11.5–15.5)
WBC: 9.6 10*3/uL (ref 4.0–10.5)

## 2012-11-30 LAB — BASIC METABOLIC PANEL
CO2: 24 mEq/L (ref 19–32)
Calcium: 8.9 mg/dL (ref 8.4–10.5)
Chloride: 105 mEq/L (ref 96–112)
GFR calc Af Amer: >90 mL/min (ref >90)
Sodium: 137 mEq/L (ref 135–145)

## 2012-11-30 MED ORDER — BETHANECHOL CHLORIDE 25 MG PO TABS
25.0000 mg | ORAL_TABLET | Freq: Four times a day (QID) | ORAL | Status: DC
  Filled 2012-11-30 (×3): qty 1

## 2012-11-30 MED ORDER — BISACODYL 5 MG PO TBEC: DELAYED_RELEASE_TABLET | ORAL | Status: DC

## 2012-11-30 MED ORDER — ACETAMINOPHEN 325 MG PO TABS: 650.0000 mg | ORAL_TABLET | Freq: Four times a day (QID) | ORAL | Status: DC | PRN

## 2012-11-30 MED ORDER — DSS 100 MG PO CAPS: ORAL_CAPSULE | ORAL | Status: DC

## 2012-11-30 MED ORDER — OXYCODONE HCL 5 MG PO TABS: ORAL_TABLET | ORAL | Status: DC

## 2012-11-30 MED ORDER — ZOLPIDEM TARTRATE 5 MG PO TABS: 5.0000 mg | ORAL_TABLET | Freq: Every evening | ORAL | Status: DC | PRN

## 2012-11-30 MED ORDER — ASPIRIN 325 MG PO TABS: 325.0000 mg | ORAL_TABLET | Freq: Every day | ORAL | Status: DC

## 2012-11-30 MED ORDER — SODIUM CHLORIDE 0.9 % IV BOLUS (SEPSIS)
500.0000 mL | Freq: Once | INTRAVENOUS | Status: AC
  Administered 2012-11-30: 500 mL via INTRAVENOUS

## 2012-11-30 MED ORDER — CELECOXIB 200 MG PO CAPS: 200.0000 mg | ORAL_CAPSULE | Freq: Every day | ORAL | Status: DC

## 2012-11-30 NOTE — Progress Notes (Signed)
Occupational Therapy Evaluation Patient Details Name: Kenneth Sherman MRN: [id number] DOB: [birth year] Today's Date: 11/30/2012 Time: 4098-1191 OT Time Calculation (min): 20 min  OT Assessment / Plan / Recommendation Clinical Impression  41 yo s/p R TKA. Pt making excellent progress. Will see additional time to educate on tub transfers and ADL.    OT Assessment  Patient needs continued OT Services    Follow Up Recommendations  No OT follow up    Barriers to Discharge None    Equipment Recommendations  Tub/shower seat    Recommendations for Other Services  none  Frequency  Min 2X/week    Precautions / Restrictions Precautions Precautions: Knee Precaution Comments: Educated pt in Terminal knee extension positioning.  Required Braces or Orthoses: Knee Immobilizer - Right Knee Immobilizer - Right: On except when in CPM Restrictions RLE Weight Bearing: Weight bearing as tolerated   Pertinent Vitals/Pain 4. nsg gave pain meds    ADL  Grooming: Modified independent Where Assessed - Grooming: Unsupported standing Upper Body Bathing: Set up;Supervision/safety Where Assessed - Upper Body Bathing: Unsupported sitting Lower Body Bathing: Minimal assistance Where Assessed - Lower Body Bathing: Supported sit to stand Upper Body Dressing: Set up Where Assessed - Upper Body Dressing: Unsupported sitting Lower Body Dressing: Moderate assistance Where Assessed - Lower Body Dressing: Supported sit to Pharmacist, hospital: Minimal Dentist Method: Sit to Barista: Comfort height toilet Toileting - Clothing Manipulation and Hygiene: Modified independent Where Assessed - Engineer, mining and Hygiene: Standing Tub/Shower Transfer: Moderate assistance Equipment Used: Rolling walker;Gait belt;Knee Immobilizer Transfers/Ambulation Related to ADLs: S ADL Comments: Decreased knowledge of functionaing with KI    OT Diagnosis: Generalized  weakness;Acute pain  OT Problem List: Decreased strength;Decreased range of motion;Decreased safety awareness;Decreased knowledge of use of DME or AE;Pain OT Treatment Interventions: Self-care/ADL training;DME and/or AE instruction;Therapeutic activities;Patient/family education   OT Goals Acute Rehab OT Goals OT Goal Formulation: With patient Time For Goal Achievement: 12/07/12 Potential to Achieve Goals: Good ADL Goals Pt Will Perform Lower Body Dressing: with set-up;Unsupported;Sit to stand from bed ADL Goal: Lower Body Dressing - Progress: Goal set today Pt Will Transfer to Toilet: with modified independence;with DME;Ambulation ADL Goal: Toilet Transfer - Progress: Goal set today Pt Will Perform Tub/Shower Transfer: with supervision;Tub transfer;with DME;Ambulation ADL Goal: Tub/Shower Transfer - Progress: Goal set today  Visit Information  Last OT Received On: 11/23/12 Assistance Needed: +1    Subjective Data      Prior Functioning     Home Living Lives With: Spouse;Family;Son Available Help at Discharge: Family;Available 24 hours/day Type of Home: House Home Access: Stairs to enter [employer] of Steps: 5 Entrance Stairs-Rails: Right;Left;Can reach both Home Layout: One level Bathroom Shower/Tub: Forensic scientist: Standard Bathroom Accessibility: Yes How Accessible: Accessible via walker Home Adaptive Equipment: Crutches Prior Function Level of Independence: Independent Able to Take Stairs?: Yes Driving: Yes Vocation: Full time employment Comments: Therapist, occupational Communication: No difficulties Dominant Hand: Right         Vision/Perception     Cognition  Overall Cognitive Status: Appears within functional limits for tasks assessed/performed Arousal/Alertness: Awake/alert Orientation Level: Appears intact for tasks assessed Behavior During Session: [facility] for tasks performed    Extremity/Trunk Assessment Right  Upper Extremity Assessment RUE ROM/Strength/Tone: WFL for tasks assessed RUE Sensation: WFL - Light Touch;WFL - Proprioception RUE Coordination: WFL - gross/fine motor Left Upper Extremity Assessment LUE ROM/Strength/Tone: WFL for tasks assessed LUE Sensation: WFL - Light Touch;WFL - Proprioception  LUE Coordination: WFL - gross/fine motor Right Lower Extremity Assessment RLE ROM/Strength/Tone: Deficits Left Lower Extremity Assessment LLE ROM/Strength/Tone: WFL for tasks assessed LLE Sensation: WFL - Proprioception;WFL - Light Touch LLE Coordination: WFL - gross/fine motor Trunk Assessment Trunk Assessment: Normal     Mobility Bed Mobility Bed Mobility: Supine to Sit;Sitting - Scoot to Edge of Bed Supine to Sit: 7: Independent Sitting - Scoot to Edge of Bed: 7: Independent Transfers Transfers: Sit to Stand;Stand to Sit Sit to Stand: 5: Supervision;With upper extremity assist;From bed Stand to Sit: 5: Supervision;With upper extremity assist;To chair/3-in-1 Details for Transfer Assistance: vc for hand placement     Shoulder Instructions     Exercise  Educated in positioning of knee in ext and use of ice   Balance Balance Balance Assessed:  (WFL)   End of Session OT - End of Session Equipment Utilized During Treatment: Gait belt;Right knee immobilizer Activity Tolerance: Patient tolerated treatment well Patient left: in chair;with call bell/phone within reach;with family/visitor present Nurse Communication: Mobility status;Other (comment)  GO     Kenneth Sherman,HILLARY 11/30/2012, 10:13 AM Luisa Dago, OTR/L  [phone] 11/30/2012

## 2012-11-30 NOTE — Progress Notes (Signed)
Orthopaedic Trauma Service (OTS)  Subjective: 1 Day Post-Op Procedure(s) (LRB): UNICOMPARTMENTAL KNEE (Right) Doing well Worked with PT and ambulated well Block still working No other complaints Denies cp or SOB No lightheadedness or dizziness Ready to go home  Objective: Current Vitals Blood pressure 113/69, pulse 75, temperature 98.3 F (36.8 C), temperature source Oral, resp. rate 18, SpO2 98.00%. Vital signs in last 24 hours: Temp:  [97.5 F (36.4 C)-98.7 F (37.1 C)] 98.3 F (36.8 C) (12/21 0536) Pulse Rate:  [75-95] 75  (12/21 0536) Resp:  [18] 18  (12/21 0536) BP: (102-126)/(65-86) 113/69 mmHg (12/21 0536) SpO2:  [96 %-98 %] 98 % (12/21 0536)  Intake/Output from previous day: 12/20 0701 - 12/21 0700 In: 1750 [I.V.:1750] Out: 2200 [Urine:2000; Drains:150; Blood:50] Intake/Output      12/20 0701 - 12/21 0700 12/21 0701 - 12/22 0700   I.V. 1750    Total Intake 1750    Urine 2000    Drains 150    Blood 50    Total Output 2200    Net -450            LABS  Basename 11/30/12 0435  HGB 12.5*    Basename 11/30/12 0435  WBC 9.6  RBC 4.56  HCT 37.4*  PLT 203    Basename 11/30/12 0435  NA 137  K 4.4  CL 105  CO2 24  BUN 12  CREATININE 1.04  GLUCOSE 131*  CALCIUM 8.9   No results found for this basename: LABPT:2,INR:2 in the last 72 hours  Physical Exam  Gen: awake and alert, NAD  Lungs: clear Cardiac:s1 and s2 Abd: + BS, NT Ext:      Right Lower Extremity  Dressing c/d/i  Swelling stable  Moves toes and ankle  Block still working  + DP pulse   Imaging No results found.  Assessment/Plan: 1 Day Post-Op Procedure(s) (LRB): UNICOMPARTMENTAL KNEE (Right)  41 y/o male s/p R unicompartmental knee replacement  Pt to d/c home today  WBAT  ROM as tolerated  No pillows under knee    To follow up with Dr. Thurston Hole in 10-14 days   Lovenox for dvt/pe prophylaxis   Mearl Latin, PA-C [facility] [phone]  (P) 11/30/2012, 11:03 AM

## 2012-11-30 NOTE — Discharge Summary (Signed)
Patient ID: Kenneth Sherman MRN: [id number] DOB/AGE: October 14, 1971 41 y.o.  Admit date: 11/29/2012 Discharge date: 11/30/2012  Admission Diagnoses:  Principal Problem:  *Right knee DJD Active Problems:  Hypertension   Discharge Diagnoses:  Same  Past Medical History  Diagnosis Date  . Right knee DJD   . Hypertension     no meds    Surgeries: Procedure(s): UNICOMPARTMENTAL KNEE on 11/29/2012   Consultants:    Discharged Condition: Improved  Hospital Course: Kenneth Sherman is an 41 y.o. male who was admitted 11/29/2012 for operative treatment ofRight knee DJD. Patient has severe unremitting pain that affects sleep, daily activities, and work/hobbies. After pre-op clearance the patient was taken to the operating room on 11/29/2012 and underwent  Procedure(s): UNICOMPARTMENTAL KNEE.    Patient was given perioperative antibiotics: Anti-infectives     Start     Dose/Rate Route Frequency Ordered Stop   11/29/12 1400   ceFAZolin (ANCEF) IVPB 2 g/50 mL premix        2 g 100 mL/hr over 30 Minutes Intravenous Every 6 hours 11/29/12 1117 11/29/12 2130   11/29/12 0818   cefUROXime (ZINACEF) injection  Status:  Discontinued          As needed 11/29/12 0818 11/29/12 0922   11/28/12 1424   ceFAZolin (ANCEF) 3 g in dextrose 5 % 50 mL IVPB        3 g 160 mL/hr over 30 Minutes Intravenous 60 min pre-op 11/28/12 1424 11/29/12 0732           Patient was given sequential compression devices, early ambulation, and chemoprophylaxis to prevent DVT.  Patient benefited maximally from hospital stay and there were no complications.    Recent vital signs: Patient Vitals for the past 24 hrs:  BP Temp Pulse Resp SpO2  11/30/12 0536 113/69 mmHg 98.3 F (36.8 C) 75  18  98 %  11/30/12 0400 - - - 18  96 %  11/30/12 0000 - - - 18  96 %  11/29/12 2103 102/65 mmHg 98.7 F (37.1 C) 80  18  96 %  11/29/12 2000 - - - 18  96 %     Recent laboratory studies:  [facility] 11/30/12 0435  WBC 9.6  HGB  12.5*  HCT 37.4*  PLT 203  NA 137  K 4.4  CL 105  CO2 24  BUN 12  CREATININE 1.04  GLUCOSE 131*  INR --  CALCIUM 8.9     Discharge Medications:     Medication List     As of 11/30/2012 11:54 AM    TAKE these medications         acetaminophen 325 MG tablet   Commonly known as: TYLENOL   Take 2 tablets (650 mg total) by mouth every 6 (six) hours as needed for pain or fever (or Fever >/= 101).      aspirin 325 MG tablet   Take 1 tablet (325 mg total) by mouth daily.      bisacodyl 5 MG EC tablet   Commonly known as: DULCOLAX   Take 2 tablets every night with dinner until bowel movement.  LAXITIVE.  Restart if two days since last bowel movement      celecoxib 200 MG capsule   Commonly known as: CELEBREX   Take 1 capsule (200 mg total) by mouth daily.      DSS 100 MG Caps   1 tab 2 times a day while on narcotics.  STOOL SOFTENER  oxyCODONE 5 MG immediate release tablet   Commonly known as: Oxy IR/ROXICODONE   1-2 tablets every 4-6 hrs as needed for pain      zolpidem 5 MG tablet   Commonly known as: AMBIEN   Take 1 tablet (5 mg total) by mouth at bedtime as needed for sleep.        Diagnostic Studies: Dg Chest 2 View  11/20/2012  *RADIOLOGY REPORT*  Clinical Data: Uni-compartment right knee arthroplasty  CHEST - 2 VIEW  Comparison: None.  Findings: The heart size and mediastinal contours are within normal limits.  Both lungs are clear.  The visualized skeletal structures are unremarkable.  IMPRESSION: Negative exam.   Original Report Authenticated By: Signa Kell, M.D.     Disposition: Final discharge disposition not confirmed      Discharge Orders    Future Appointments: Provider: Department: Dept Phone: Center:   02/12/2013 2:30 PM Nicki Reaper, NP [facility] [phone] [facility]     Future Orders Please Complete By Expires   Diet - low sodium heart healthy      Call MD / Call 911      Comments:   If you experience  chest pain or shortness of breath, CALL 911 and be transported to the hospital emergency room.  If you develope a fever above 101 F, pus (white drainage) or increased drainage or redness at the wound, or calf pain, call your surgeon's office.   Discharge instructions      Comments:   Partial Knee Replacement Care After Refer to this sheet in the next few weeks. These discharge instructions provide you with general information on caring for yourself after you leave the hospital. Your caregiver may also give you specific instructions. Your treatment has been planned according to the most current medical practices available, but unavoidable complications sometimes occur. If you have any problems or questions after discharge, please call your caregiver. Regaining a near full range of motion of your knee within the first 3 to 6 weeks after surgery is critical. HOME CARE INSTRUCTIONS  You may resume a normal diet and activities as directed.  Perform exercises as directed.  Place yellow foam block, yellow side up under heel at all times except when in CPM or when walking.  DO NOT modify, tear, cut, or change in any way. You will receive physical therapy daily  Take showers instead of baths until informed otherwise.  Change bandages (dressings)daily Do not take over-the-counter or prescription medicines for pain, discomfort, or fever. Eat a well-balanced diet.  Avoid lifting or driving until you are instructed otherwise.  Make an appointment to see your caregiver for stitches (suture) or staple removal as directed.  If you have been sent home with a continuous passive motion machine (CPM machine), 0-90 degrees 6 hrs a day   2 hrs a shift SEEK MEDICAL CARE IF: You have swelling of your calf or leg.  You develop shortness of breath or chest pain.  You have redness, swelling, or increasing pain in the wound.  There is pus or any unusual drainage coming from the surgical site.  You notice a bad smell  coming from the surgical site or dressing.  The surgical site breaks open after sutures or staples have been removed.  There is persistent bleeding from the suture or staple line.  You are getting worse or are not improving.  You have any other questions or concerns.  SEEK IMMEDIATE MEDICAL CARE IF:  You have  a fever.  You develop a rash.  You have difficulty breathing.  You develop any reaction or side effects to medicines given.  Your knee motion is decreasing rather than improving.  MAKE SURE YOU:  Understand these instructions.  Will watch your condition.  Will get help right away if you are not doing well or get worse.   Constipation Prevention      Comments:   Drink plenty of fluids.  Prune juice may be helpful.  You may use a stool softener, such as Colace (over the counter) 100 mg twice a day.  Use MiraLax (over the counter) for constipation as needed.   Increase activity slowly as tolerated      CPM      Comments:   Continuous passive motion machine (CPM):      Use the CPM from 0 to 90 for 6 hours per day.       You may break it up into 2 or 3 sessions per day.      Use CPM for 2 weeks or until you are told to stop.   TED hose      Comments:   Use stockings (TED hose) for 2 weeks on both leg(s).  You may remove them at night for sleeping.   Change dressing      Comments:   Change the dressing daily with sterile 4 x 4 inch gauze dressing and apply TED hose.  You may clean the incision with alcohol prior to redressing.   Do not put a pillow under the knee. Place it under the heel.      Comments:   Place yellow foam block, yellow side up under heel at all times except when in CPM or when walking.  DO NOT modify, tear, cut, or change in any way the yellow foam block.      Follow-up Information    Follow up with Pascal Lux, PA. On 12/06/2012. (as scheduled)    Contact information:   [address] [place] [address] [phone]            Signed: Pascal Lux 11/30/2012, 11:54 AM

## 2012-11-30 NOTE — Progress Notes (Signed)
CARE MANAGEMENT NOTE 11/30/2012  Patient:  Kenneth Sherman,Kenneth Sherman   Account Number:  [id number]  Date Initiated:  11/29/2012  Documentation initiated by:  Vance Peper  Subjective/Objective Assessment:   66 yr olf male s/p right total knee unicompartmental arthroplasty.     Action/Plan:   patient preoperatively setup with [facility]. CM will follow.  CPM and rolling walker to be delivered by [employer]. Patient doesnt want 3in 1. Notified [facility] patient being d/c'd today.   Anticipated DC Date:  11/30/2012   Anticipated DC Plan:  HOME W HOME HEALTH SERVICES      DC Planning Services  CM consult      PAC Choice  DURABLE MEDICAL EQUIPMENT  HOME HEALTH   Choice offered to / List presented to:     DME arranged  WALKER - ROLLING  CPM      DME agency  [employer]     HH arranged  HH-2 PT      HH agency  [facility]   Status of service:  Completed, signed off Medicare Important Message given?   (If response is "NO", the following Medicare IM given date fields will be blank) Date Medicare IM given:   Date Additional Medicare IM given:    Discharge Disposition:  HOME W HOME HEALTH SERVICES  Per UR Regulation:    If discussed at Long Length of Stay Meetings, dates discussed:    Comments:

## 2012-11-30 NOTE — Progress Notes (Signed)
Physical Therapy Treatment Patient Details Name: Kenneth Sherman MRN: [id number] DOB: Apr 15, 1971 Today's Date: 11/30/2012 Time: 1032-1100 PT Time Calculation (min): 28 min  PT Assessment / Plan / Recommendation Comments on Treatment Session  Pt making great progress toward goals, safe to discharge home at current mobility status.    Follow Up Recommendations  Home health PT           Equipment Recommendations  Rolling walker with 5" wheels;Other (comment) (3n1)       Frequency 7X/week   Plan Discharge plan remains appropriate;Frequency remains appropriate    Precautions / Restrictions Precautions Precautions: Knee Precaution Comments: Educated pt in Terminal knee extension positioning.  Required Braces or Orthoses: Knee Immobilizer - Right Knee Immobilizer - Right: On except when in CPM Restrictions RLE Weight Bearing: Weight bearing as tolerated       Mobility  Bed Mobility Bed Mobility: Supine to Sit;Sitting - Scoot to Edge of Bed Supine to Sit: 7: Independent Sitting - Scoot to Edge of Bed: 7: Independent Details for Bed Mobility Assistance: Pt out of bed in recliner upon entering room Transfers Sit to Stand: 6: Modified independent (Device/Increase time);With upper extremity assist;From chair/3-in-1;With armrests Stand to Sit: 6: Modified independent (Device/Increase time);To chair/3-in-1;With upper extremity assist;With armrests Details for Transfer Assistance: no cues or assist needed Ambulation/Gait Ambulation/Gait Assistance: 5: Supervision;6: Modified independent (Device/Increase time) Ambulation Distance (Feet): 100 Feet Assistive device: Rolling walker Ambulation/Gait Assistance Details: cues intitally for posture and walker postion, progressed to mod I with gait Gait Pattern: Step-to pattern;Decreased stance time - right;Decreased step length - left Stairs: Yes Stairs Assistance: 5: Supervision Stairs Assistance Details (indicate cue type and reason): 5  stairs with 2 rails forwards x1 rep, 5 stairs with 1 rail sideways x1 rep. supervision with both reps Stair Management Technique: Step to pattern;One rail Left;Two rails;Sideways;Forwards Number of Stairs: 5     Exercises Total Joint Exercises Ankle Circles/Pumps: AROM;Both;10 reps;Supine Quad Sets: AROM;Strengthening;Right;10 reps;Supine Heel Slides: AAROM;Strengthening;Right;10 reps;Supine Hip ABduction/ADduction: AAROM;Strengthening;Right;10 reps;Supine Straight Leg Raises: AAROM;Strengthening;Right;10 reps;Supine     PT Goals Acute Rehab PT Goals PT Goal: Sit to Stand - Progress: Met PT Goal: Stand to Sit - Progress: Met PT Transfer Goal: Bed to Chair/Chair to Bed - Progress: Met PT Goal: Ambulate - Progress: Progressing toward goal PT Goal: Up/Down Stairs - Progress: Met PT Goal: Perform Home Exercise Program - Progress: Progressing toward goal  Visit Information  Last PT Received On: 11/30/12 Assistance Needed: +1    Subjective Data  Subjective: No new complaints, agreeable to therapy today.   Cognition  Overall Cognitive Status: Appears within functional limits for tasks assessed/performed Arousal/Alertness: Awake/alert Orientation Level: Appears intact for tasks assessed Behavior During Session: [facility] for tasks performed    Balance  Balance Balance Assessed:  (WFL)  End of Session PT - End of Session Equipment Utilized During Treatment: Gait belt;Right knee immobilizer Activity Tolerance: Patient tolerated treatment well Patient left: in chair;with call bell/phone within reach;with family/visitor present Nurse Communication: Mobility status   GP     Sallyanne Kuster 11/30/2012, 11:38 AM  Sallyanne Kuster, PTA Office- [phone]

## 2012-11-30 NOTE — Progress Notes (Signed)
Occupational Therapy Treatment Patient Details Name: Kenneth Sherman MRN: [id number] DOB: 1971/11/03 Today's Date: 11/30/2012 Time: 0930-1000 OT Time Calculation (min): 30 min  OT Assessment / Plan / Recommendation Comments on Treatment Session Excellent progress. Pt mod I with mobility and S for ADL. Pt will need to do stairs with PT and then is appropriate for D/C home. Awaiting DME for home D/C.     Follow Up Recommendations  No OT follow up    Barriers to Discharge  None    Equipment Recommendations  Tub/shower seat    Recommendations for Other Services  none  Frequency Min 2X/week   Plan Discharge plan remains appropriate    Precautions / Restrictions Precautions Precautions: Knee Precaution Comments: Educated pt in Terminal knee extension positioning.  Required Braces or Orthoses: Knee Immobilizer - Right Knee Immobilizer - Right: On except when in CPM Restrictions RLE Weight Bearing: Weight bearing as tolerated   Pertinent Vitals/Pain 3    ADL  Grooming: Modified independent Where Assessed - Grooming: Unsupported standing Upper Body Bathing: Set up;Supervision/safety Where Assessed - Upper Body Bathing: Unsupported sitting Lower Body Bathing: Minimal assistance Where Assessed - Lower Body Bathing: Supported sit to stand Upper Body Dressing: Set up Where Assessed - Upper Body Dressing: Unsupported sitting Lower Body Dressing: Set up;Supervision/safety Where Assessed - Lower Body Dressing: Unsupported sit to stand Toilet Transfer: Modified independent Toilet Transfer Method: Sit to stand;Stand pivot Acupuncturist: Comfort height toilet Toileting - Clothing Manipulation and Hygiene: Modified independent Where Assessed - Engineer, mining and Hygiene: Standing Tub/Shower Transfer: Moderate assistance Tub/Shower Transfer Method: Ecologist with back Equipment Used: Rolling walker;Knee  Immobilizer Transfers/Ambulation Related to ADLs: S ADL Comments: Educated on compensatory tasks for ADL and DME use. Pt completed tub and toilet transfer    OT Diagnosis: Generalized weakness;Acute pain  OT Problem List: Decreased strength;Decreased range of motion;Decreased safety awareness;Decreased knowledge of use of DME or AE;Pain OT Treatment Interventions: Self-care/ADL training;DME and/or AE instruction;Therapeutic activities;Patient/family education   OT Goals Acute Rehab OT Goals OT Goal Formulation: With patient Time For Goal Achievement: 12/07/12 Potential to Achieve Goals: Good ADL Goals Pt Will Perform Lower Body Dressing: with set-up;Unsupported;Sit to stand from bed ADL Goal: Lower Body Dressing - Progress: Met Pt Will Transfer to Toilet: with modified independence;with DME;Ambulation ADL Goal: Toilet Transfer - Progress: Met Pt Will Perform Tub/Shower Transfer: with supervision;Tub transfer;with DME;Ambulation ADL Goal: Tub/Shower Transfer - Progress: Met  Visit Information  Last OT Received On: 11/30/12 Assistance Needed: +1    Subjective Data      Prior Functioning  Home Living Lives With: Spouse;Family;Son Available Help at Discharge: Family;Available 24 hours/day Type of Home: House Home Access: Stairs to enter [employer] of Steps: 5 Entrance Stairs-Rails: Right;Left;Can reach both Home Layout: One level Bathroom Shower/Tub: Forensic scientist: Standard Bathroom Accessibility: Yes How Accessible: Accessible via walker Home Adaptive Equipment: Crutches Prior Function Level of Independence: Independent Able to Take Stairs?: Yes Driving: Yes Vocation: Full time employment Comments: Therapist, occupational Communication: No difficulties Dominant Hand: Right    Cognition  Overall Cognitive Status: Appears within functional limits for tasks assessed/performed Arousal/Alertness: Awake/alert Orientation Level:  Appears intact for tasks assessed Behavior During Session: [facility] for tasks performed    Mobility  Shoulder Instructions Bed Mobility Bed Mobility: Supine to Sit;Sitting - Scoot to Edge of Bed Supine to Sit: 7: Independent Sitting - Scoot to Edge of Bed: 7: Independent Transfers Transfers: Sit to Stand;Stand to [employer]  Sit to Stand: 6: Modified independent (Device/Increase time);With upper extremity assist;From chair/3-in-1 Stand to Sit: 6: Modified independent (Device/Increase time);With upper extremity assist;Other (comment) (to shower seat) Details for Transfer Assistance: good carry over of safety techniques       Exercises      Balance Balance Balance Assessed:  (WFL)   End of Session OT - End of Session Equipment Utilized During Treatment: Gait belt;Right knee immobilizer Activity Tolerance: Patient tolerated treatment well Patient left: in chair;with call bell/phone within reach;with family/visitor present Nurse Communication: Other (comment) (ok for D/C home after PT session)  GO     Kenneth Sherman,Kenneth Sherman 11/30/2012, 10:21 AM Luisa Dago, OTR/L  [phone] 11/30/2012

## 2012-11-30 NOTE — Progress Notes (Signed)
D/C instructions reviewed with patient and wife. RX x 6 given. hh services arranged with [facility]. hh equipment at the bedside. All questions answered. Pt d/c'ed via wheelchair in stable condition

## 2012-12-02 ENCOUNTER — Emergency Department (HOSPITAL_COMMUNITY): Payer: BC Managed Care – PPO

## 2012-12-02 ENCOUNTER — Emergency Department (HOSPITAL_COMMUNITY)
Admission: EM | Admit: 2012-12-02 | Discharge: 2012-12-02 | Disposition: A | Discharge status: Home Health | Payer: BC Managed Care – PPO | Attending: Emergency Medicine | Admitting: Emergency Medicine

## 2012-12-02 ENCOUNTER — Encounter (HOSPITAL_COMMUNITY): Payer: Self-pay | Admitting: Orthopedic Surgery

## 2012-12-02 DIAGNOSIS — R059 Cough, unspecified: Secondary | ICD-10-CM | POA: Insufficient documentation

## 2012-12-02 DIAGNOSIS — Z8739 Personal history of other diseases of the musculoskeletal system and connective tissue: Secondary | ICD-10-CM | POA: Insufficient documentation

## 2012-12-02 DIAGNOSIS — Z79899 Other long term (current) drug therapy: Secondary | ICD-10-CM | POA: Insufficient documentation

## 2012-12-02 DIAGNOSIS — R0602 Shortness of breath: Secondary | ICD-10-CM | POA: Insufficient documentation

## 2012-12-02 DIAGNOSIS — R05 Cough: Secondary | ICD-10-CM | POA: Insufficient documentation

## 2012-12-02 DIAGNOSIS — Z7982 Long term (current) use of aspirin: Secondary | ICD-10-CM | POA: Insufficient documentation

## 2012-12-02 DIAGNOSIS — IMO0001 Reserved for inherently not codable concepts without codable children: Secondary | ICD-10-CM | POA: Insufficient documentation

## 2012-12-02 DIAGNOSIS — I10 Essential (primary) hypertension: Secondary | ICD-10-CM | POA: Insufficient documentation

## 2012-12-02 DIAGNOSIS — R509 Fever, unspecified: Secondary | ICD-10-CM

## 2012-12-02 DIAGNOSIS — R6889 Other general symptoms and signs: Secondary | ICD-10-CM

## 2012-12-02 LAB — COMPREHENSIVE METABOLIC PANEL
Albumin: 3.4 g/dL — ABNORMAL LOW (ref 3.5–5.2)
Alkaline Phosphatase: 73 U/L (ref 39–117)
BUN: 13 mg/dL (ref 6–23)
Calcium: 8.4 mg/dL (ref 8.4–10.5)
Creatinine, Ser: 0.98 mg/dL (ref 0.50–1.35)
GFR calc Af Amer: >90 mL/min (ref >90)
Glucose, Bld: 101 mg/dL — ABNORMAL HIGH (ref 70–99)
Potassium: 3.5 mEq/L (ref 3.5–5.1)
Total Protein: 6.7 g/dL (ref 6.0–8.3)

## 2012-12-02 LAB — CBC WITH DIFFERENTIAL/PLATELET
Basophils Relative: 0 % (ref 0–1)
Eosinophils Absolute: 0 10*3/uL (ref 0.0–0.7)
Eosinophils Relative: 1 % (ref 0–5)
HCT: 36.7 % — ABNORMAL LOW (ref 39.0–52.0)
Hemoglobin: 12.9 g/dL — ABNORMAL LOW (ref 13.0–17.0)
Lymphs Abs: 0.7 10*3/uL (ref 0.7–4.0)
MCH: 28.5 pg (ref 26.0–34.0)
MCHC: 35.1 g/dL (ref 30.0–36.0)
MCV: 81 fL (ref 78.0–100.0)
Monocytes Absolute: 0.5 10*3/uL (ref 0.1–1.0)
Monocytes Relative: 7 % (ref 3–12)
RBC: 4.53 MIL/uL (ref 4.22–5.81)

## 2012-12-02 LAB — CG4 I-STAT (LACTIC ACID): Lactic Acid, Venous: 1.13 mmol/L (ref 0.5–2.2)

## 2012-12-02 LAB — URINALYSIS, ROUTINE W REFLEX MICROSCOPIC
Ketones, ur: 15 mg/dL — AB
Leukocytes, UA: NEGATIVE
Nitrite: NEGATIVE
pH: 7.5 (ref 5.0–8.0)

## 2012-12-02 LAB — URINE MICROSCOPIC-ADD ON

## 2012-12-02 MED ORDER — OSELTAMIVIR PHOSPHATE 75 MG PO CAPS: 75.0000 mg | ORAL_CAPSULE | Freq: Two times a day (BID) | ORAL | Status: DC

## 2012-12-02 MED ORDER — HYDROCOD POLST-CHLORPHEN POLST 10-8 MG/5ML PO LQCR: 5.0000 mL | Freq: Two times a day (BID) | ORAL | Status: DC

## 2012-12-02 NOTE — ED Notes (Signed)
Family at bedside. 

## 2012-12-02 NOTE — ED Provider Notes (Signed)
History     CSN: [id number]  Arrival date & time 12/02/12  7829   First MD Initiated Contact with Patient 12/02/12 2215      Chief Complaint  Patient presents with  . Fever    (Consider location/radiation/quality/duration/timing/severity/associated sxs/prior treatment) Patient is a 41 y.o. male presenting with general illness. The history is provided by the patient. No language interpreter was used.  Illness  The current episode started today. The problem occurs continuously. The problem has been unchanged. The problem is moderate. Nothing relieves the symptoms. Nothing aggravates the symptoms. Associated symptoms include a fever and cough. Pertinent negatives include no abdominal pain, no constipation, no diarrhea, no nausea, no vomiting, no congestion, no headaches, no sore throat and no rash.    Past Medical History  Diagnosis Date  . Right knee DJD   . Hypertension     no meds    Past Surgical History  Procedure Date  . Knee arthroscopy w/ meniscectomy 2008    left  . Knee arthroscopy w/ meniscectomy 2011    right  . Partial knee arthroplasty 11/29/2012    Procedure: UNICOMPARTMENTAL KNEE;  Surgeon: Nilda Simmer, MD;  Location: [facility] OR;  Service: Orthopedics;  Laterality: Right;    Family History  Problem Relation Age of Onset  . Heart attack Mother   . Hypertension Mother   . Hypertension Father   . Hypertension Brother   . Heart attack Father   . Heart attack Father     History  Substance Use Topics  . Smoking status: Never Smoker   . Smokeless tobacco: Not on file  . Alcohol Use: 1.2 oz/week    1 Glasses of wine, 1 Cans of beer per week     Comment: occasionally      Review of Systems  Constitutional: Positive for fever and chills.  HENT: Negative for congestion and sore throat.   Respiratory: Positive for cough and shortness of breath.   Cardiovascular: Negative for chest pain and leg swelling.  Gastrointestinal: Negative for nausea, vomiting,  abdominal pain, diarrhea and constipation.  Genitourinary: Negative for dysuria and frequency.  Musculoskeletal: Positive for myalgias.  Skin: Negative for color change and rash.  Neurological: Negative for dizziness and headaches.  Psychiatric/Behavioral: Negative for confusion and agitation.  All other systems reviewed and are negative.    Allergies  Review of patient's allergies indicates no known allergies.  Home Medications   Current Outpatient Rx  Name  Route  Sig  Dispense  Refill  . ACETAMINOPHEN 325 MG PO TABS   Oral   Take 2 tablets (650 mg total) by mouth every 6 (six) hours as needed for pain or fever (or Fever >/= 101).         . ASPIRIN 325 MG PO TABS   Oral   Take 1 tablet (325 mg total) by mouth daily.   30 tablet   0   . BISACODYL 5 MG PO TBEC   Oral   Take 10 mg by mouth at bedtime.         . CELECOXIB 200 MG PO CAPS   Oral   Take 1 capsule (200 mg total) by mouth daily.   30 capsule   3   . DSS 100 MG PO CAPS   Oral   Take 1 capsule by mouth 2 (two) times daily. take while on narcotics         . OXYCODONE HCL 5 MG PO TABS   Oral  Take 5-10 mg by mouth every 4 (four) hours as needed. for pain         . ZOLPIDEM TARTRATE 5 MG PO TABS   Oral   Take 5 mg by mouth at bedtime as needed. For sleep           BP 137/79  Pulse 91  Temp 98.8 F (37.1 C) (Oral)  Resp 20  SpO2 95%  Physical Exam  Vitals reviewed. Constitutional: He is oriented to person, place, and time. He appears well-developed and well-nourished. No distress.  HENT:  Head: Normocephalic and atraumatic.  Eyes: EOM are normal. Pupils are equal, round, and reactive to light.  Neck: Normal range of motion. Neck supple.  Cardiovascular: Regular rhythm.  Tachycardia present.   Pulmonary/Chest: Effort normal and breath sounds normal. No respiratory distress. He has no decreased breath sounds. He has no wheezes. He has no rhonchi. He has no rales.  Abdominal: Soft. He  exhibits no distension. There is no tenderness.  Musculoskeletal: Normal range of motion. He exhibits no edema.       Legs: Neurological: He is alert and oriented to person, place, and time.  Skin: Skin is warm and dry.  Psychiatric: He has a normal mood and affect. His behavior is normal.    ED Course  Procedures (including critical care time)  Results for orders placed during the hospital encounter of 12/02/12  CBC WITH DIFFERENTIAL      Component Value Range   WBC 6.6  4.0 - 10.5 K/uL   RBC 4.53  4.22 - 5.81 MIL/uL   Hemoglobin 12.9 (*) 13.0 - 17.0 g/dL   HCT 40.9 (*) 81.1 - 91.4 %   MCV 81.0  78.0 - 100.0 fL   MCH 28.5  26.0 - 34.0 pg   MCHC 35.1  30.0 - 36.0 g/dL   RDW 78.2  95.6 - 21.3 %   Platelets 205  150 - 400 K/uL   Neutrophils Relative 82 (*) 43 - 77 %   Neutro Abs 5.4  1.7 - 7.7 K/uL   Lymphocytes Relative 11 (*) 12 - 46 %   Lymphs Abs 0.7  0.7 - 4.0 K/uL   Monocytes Relative 7  3 - 12 %   Monocytes Absolute 0.5  0.1 - 1.0 K/uL   Eosinophils Relative 1  0 - 5 %   Eosinophils Absolute 0.0  0.0 - 0.7 K/uL   Basophils Relative 0  0 - 1 %   Basophils Absolute 0.0  0.0 - 0.1 K/uL  COMPREHENSIVE METABOLIC PANEL      Component Value Range   Sodium 137  135 - 145 mEq/L   Potassium 3.5  3.5 - 5.1 mEq/L   Chloride 102  96 - 112 mEq/L   CO2 25  19 - 32 mEq/L   Glucose, Bld 101 (*) 70 - 99 mg/dL   BUN 13  6 - 23 mg/dL   Creatinine, Ser 0.86  0.50 - 1.35 mg/dL   Calcium 8.4  8.4 - 57.8 mg/dL   Total Protein 6.7  6.0 - 8.3 g/dL   Albumin 3.4 (*) 3.5 - 5.2 g/dL   AST 24  0 - 37 U/L   ALT 18  0 - 53 U/L   Alkaline Phosphatase 73  39 - 117 U/L   Total Bilirubin 0.4  0.3 - 1.2 mg/dL   GFR calc non Af Amer >90  >90 mL/min   GFR calc Af Amer >90  >90 mL/min  URINALYSIS,  ROUTINE W REFLEX MICROSCOPIC      Component Value Range   Color, Urine YELLOW  YELLOW   APPearance CLOUDY (*) CLEAR   Specific Gravity, Urine 1.030  1.005 - 1.030   pH 7.5  5.0 - 8.0   Glucose, UA  NEGATIVE  NEGATIVE mg/dL   Hgb urine dipstick NEGATIVE  NEGATIVE   Bilirubin Urine NEGATIVE  NEGATIVE   Ketones, ur 15 (*) NEGATIVE mg/dL   Protein, ur 30 (*) NEGATIVE mg/dL   Urobilinogen, UA 1.0  0.0 - 1.0 mg/dL   Nitrite NEGATIVE  NEGATIVE   Leukocytes, UA NEGATIVE  NEGATIVE  CG4 I-STAT (LACTIC ACID)      Component Value Range   Lactic Acid, Venous 1.13  0.5 - 2.2 mmol/L  URINE MICROSCOPIC-ADD ON      Component Value Range   Squamous Epithelial / LPF RARE  RARE   WBC, UA 0-2  <3 WBC/hpf   RBC / HPF 0-2  <3 RBC/hpf   Bacteria, UA RARE  RARE   Urine-Other MUCOUS PRESENT    POCT I-STAT TROPONIN I      Component Value Range   Troponin i, poc 0.01  0.00 - 0.08 ng/mL   Comment 3             Dg Chest 2 View  12/02/2012  *RADIOLOGY REPORT*  Clinical Data: Fever, postop right knee surgery  CHEST - 2 VIEW  Comparison: 11/20/2012  Findings: Bronchitic changes.  No focal consolidation.  Mild patchy right basilar opacity is favored to reflect atelectasis when correlating with the lateral view.  No pleural effusion or pneumothorax.  Cardiomediastinal silhouette is within normal limits.  Mild degenerative changes of the visualized thoracolumbar spine.  IMPRESSION: Mild patchy right basilar opacity, favored to reflect atelectasis.   Original Report Authenticated By: Charline Bills, M.D.      No diagnosis found.    MDM  Pt w/ recent right partial knee replacement 3 days ago presents w/ fever, cough, body aches. Exposed to influenza - child is on tamiflu. Did not receive flu shot this year. Sudden onset today high fever 102, myalgias, cough - minimal white sputum production. No other sx. Exam - sats 95% on RA, RR 22, HR 124, lungs CTAB, knee incision C/D/I - mild warmth - likely inflammatory post op - no significant pain w/ ROM, no erythema. Doubt septic joint. Sx likely 2/2 viral influenza vs atelectasis. CXR - neg for PNA, no leukocytosis, u/a clear. ECG - NSR - no acute ischemia. Troponin  neg. Pt stable for d/c home. No hypoxia significant enough for admit to hospital. Give rx for tamiflu and tussionex for cough. Follow up w/ ortho on Friday as scheduled, given return precautions.   1. Fever   2. Cough   3. Flu-like symptoms    New Prescriptions   CHLORPHENIRAMINE-HYDROCODONE (TUSSIONEX PENNKINETIC ER) 10-8 MG/5ML LQCR    Take 5 mLs by mouth every 12 (twelve) hours.   OSELTAMIVIR (TAMIFLU) 75 MG CAPSULE    Take 1 capsule (75 mg total) by mouth every 12 (twelve) hours.   Nicki Reaper, NP [phone] [address] [facility] [place] [address] [phone]    [facility] [address] [place] [address] [phone] On 12/06/2012          Audelia Hives, MD 12/02/12 [phone]

## 2012-12-02 NOTE — ED Notes (Signed)
DG chest x-ray completed; results back at 2159.

## 2012-12-02 NOTE — ED Notes (Signed)
Resident at bedside.  

## 2012-12-02 NOTE — ED Notes (Signed)
Pt became dizzy, diaphoretic and pale. BP decreased to 70/42, hr dropped from 120s to 65. Pt placed in trendelenburg, O2 placed and pt given deep breathing and cool rag, support given. BP increased to 109/59 after interventions and pts color returned. Slowly brought pt out of trendelenburg and BP remains 126/65 at this time, HR increased to 99.

## 2012-12-02 NOTE — ED Notes (Signed)
Presents with 3 days post op right partial knee replacement, incision CDI and healing well, no drainage. Today developed fever of 102.9, cough , body aches SAts on RA 92%, HR 131. Took Acetaminophen prior to arrival temp decreased to 99.0.  Dewaine Conger orthopedic surgeon told pt come here for further evaluation. Cough is productive with white phlegm. Denies SOB. Has been using incentive spirometer at home.

## 2012-12-02 NOTE — ED Notes (Signed)
Pt in radiology, will be taken to room upon return.

## 2012-12-02 NOTE — ED Provider Notes (Signed)
A 40 year old male who is 3 days post op right partial knee replacement developed fever and chills today but it is a nonproductive cough as well as a bodyaches. He has been exposed to influenza and that he has a son is currently on Tamiflu. On exam, lungs are clear and heart has regular rate and rhythm. His incision appears clean without any sign of infection. Clinically, it seems most likely that he has influenza. He is being offered a prescription for Tamiflu, with the understanding that he can be expected to decrease the length of illness by 24 hours and is not being given as a curative agent.  I saw and evaluated the patient, reviewed the resident's note and I agree with the findings and plan.   Dione Booze, MD 12/02/12 [phone]

## 2013-01-25 ENCOUNTER — Other Ambulatory Visit: Payer: Self-pay

## 2013-02-12 ENCOUNTER — Other Ambulatory Visit (INDEPENDENT_AMBULATORY_CARE_PROVIDER_SITE_OTHER): Payer: BC Managed Care – PPO

## 2013-02-12 ENCOUNTER — Encounter: Payer: Self-pay | Admitting: Internal Medicine

## 2013-02-12 ENCOUNTER — Ambulatory Visit: Payer: BC Managed Care – PPO | Admitting: Internal Medicine

## 2013-02-12 ENCOUNTER — Ambulatory Visit (INDEPENDENT_AMBULATORY_CARE_PROVIDER_SITE_OTHER): Payer: BC Managed Care – PPO | Admitting: Internal Medicine

## 2013-02-12 VITALS — BP 112/66 | HR 76 | Temp 98.7°F | Ht 71.0 in | Wt 247.8 lb

## 2013-02-12 DIAGNOSIS — E291 Testicular hypofunction: Secondary | ICD-10-CM

## 2013-02-12 DIAGNOSIS — E785 Hyperlipidemia, unspecified: Secondary | ICD-10-CM | POA: Insufficient documentation

## 2013-02-12 DIAGNOSIS — E1169 Type 2 diabetes mellitus with other specified complication: Secondary | ICD-10-CM | POA: Insufficient documentation

## 2013-02-12 DIAGNOSIS — E349 Endocrine disorder, unspecified: Secondary | ICD-10-CM

## 2013-02-12 LAB — LIPID PANEL
HDL: 32.9 mg/dL — ABNORMAL LOW (ref >39.00)
Total CHOL/HDL Ratio: 5
Triglycerides: 205 mg/dL — ABNORMAL HIGH (ref 0.0–149.0)
VLDL: 41 mg/dL — ABNORMAL HIGH (ref 0.0–40.0)

## 2013-02-12 MED ORDER — TESTOSTERONE CYPIONATE 200 MG/ML IM SOLN: 200.0000 mg | INTRAMUSCULAR | Status: DC

## 2013-02-12 NOTE — Progress Notes (Signed)
Subjective:    Patient ID: Kenneth Sherman, male    DOB: [birth year], 42 y.o.   MRN: [id number]  HPI  Pt presents to the clinic today to f/u on lipids and testosterone. At his last visit, his labs revealed that he had high cholesterol and low testosterone. We restarted his androgel, but decided to try 3 months of lifestyle therapy for his cholesterol. He has been doing well with diet and exercise. He has cut out fried and fatty foods. He has lost 4-5 pounds. He has not been able to do a lot of aerobic exercise secondary to recent partial knee replacement. He does do PT 3 x week. He also did not start his androgel because he was concerned about it rubbing off on his 14 month old son. He would like to try the injections instead.  Review of Systems      Past Medical History  Diagnosis Date  . Right knee DJD   . Hypertension     no meds    Current Outpatient Prescriptions  Medication Sig Dispense Refill  . acetaminophen (TYLENOL) 325 MG tablet Take 2 tablets (650 mg total) by mouth every 6 (six) hours as needed for pain or fever (or Fever >/= 101).      Marland Kitchen aspirin (BAYER ASPIRIN) 325 MG tablet Take 1 tablet (325 mg total) by mouth daily.  30 tablet  0  . bisacodyl (DULCOLAX) 5 MG EC tablet Take 10 mg by mouth at bedtime.      . celecoxib (CELEBREX) 200 MG capsule Take 1 capsule (200 mg total) by mouth daily.  30 capsule  3  . chlorpheniramine-HYDROcodone (TUSSIONEX PENNKINETIC ER) 10-8 MG/5ML LQCR Take 5 mLs by mouth every 12 (twelve) hours.  115 mL  0  . Docusate Sodium (DSS) 100 MG CAPS Take 1 capsule by mouth 2 (two) times daily. take while on narcotics      . oseltamivir (TAMIFLU) 75 MG capsule Take 1 capsule (75 mg total) by mouth every 12 (twelve) hours.  10 capsule  0  . oxyCODONE (OXY IR/ROXICODONE) 5 MG immediate release tablet Take 5-10 mg by mouth every 4 (four) hours as needed. for pain      . zolpidem (AMBIEN) 5 MG tablet Take 5 mg by mouth at bedtime as needed. For sleep       No  current facility-administered medications for this visit.   Facility-Administered Medications Ordered in Other Visits  Medication Dose Route Frequency Provider Last Rate Last Dose  . chlorhexidine (HIBICLENS) 4 % liquid 4 application  60 mL Topical Once Kirstin J Shepperson, PA-C        No Known Allergies  Family History  Problem Relation Age of Onset  . Heart attack Mother   . Hypertension Mother   . Hypertension Father   . Hypertension Brother   . Heart attack Father   . Heart attack Father     History   Social History  . Marital Status: Married    Spouse Name: N/A    Number of Children: N/A  . Years of Education: 14   Occupational History  . CAD Designer    Social History Main Topics  . Smoking status: Never Smoker   . Smokeless tobacco: Not on file  . Alcohol Use: 1.2 oz/week    1 Glasses of wine, 1 Cans of beer per week     Comment: occasionally  . Drug Use: No  . Sexually Active: Yes    Birth Control/ Protection: Condom  Other Topics Concern  . Not on file   Social History Narrative   Regular exercise-no   Caffeine Use-yes     Constitutional: Pt reports fatigue. Denies fever, malaise,  headache or abrupt weight changes.  Respiratory: Denies difficulty breathing, shortness of breath, cough or sputum production.   Neurological: Denies dizziness, difficulty with memory, difficulty with speech or problems with balance and coordination.   No other specific complaints in a complete review of systems (except as listed in HPI above).  Objective:   Physical Exam   BP 112/66  Pulse 76  Temp(Src) 98.7 F (37.1 C) (Oral)  Ht 5\' 11"  (1.803 m)  Wt 247 lb 12.8 oz (112.401 kg)  BMI 34.58 kg/m2  SpO2 97% Wt Readings from Last 3 Encounters:  02/12/13 247 lb 12.8 oz (112.401 kg)  11/20/12 251 lb 12.3 oz (114.2 kg)  11/14/12 252 lb (114.306 kg)    General: Appears his stated age, well developed, well nourished in NAD. Cardiovascular: Normal rate and  rhythm. S1,S2 noted.  No murmur, rubs or gallops noted. No JVD or BLE edema. No carotid bruits noted. Pulmonary/Chest: Normal effort and positive vesicular breath sounds. No respiratory distress. No wheezes, rales or ronchi noted.   Neurological: Alert and oriented. Cranial nerves II-XII intact. Coordination normal. +DTRs bilaterally.  BMET    Component Value Date/Time   NA 137 12/02/2012 2009   K 3.5 12/02/2012 2009   CL 102 12/02/2012 2009   CO2 25 12/02/2012 2009   GLUCOSE 101* 12/02/2012 2009   BUN 13 12/02/2012 2009   CREATININE 0.98 12/02/2012 2009   CALCIUM 8.4 12/02/2012 2009   GFRNONAA >90 12/02/2012 2009   GFRAA >90 12/02/2012 2009    Lipid Panel     Component Value Date/Time   CHOL 218* 11/14/2012 1008   TRIG 89.0 11/14/2012 1008   HDL 41.10 11/14/2012 1008   CHOLHDL 5 11/14/2012 1008   VLDL 17.8 11/14/2012 1008    CBC    Component Value Date/Time   WBC 6.6 12/02/2012 2009   RBC 4.53 12/02/2012 2009   HGB 12.9* 12/02/2012 2009   HCT 36.7* 12/02/2012 2009   PLT 205 12/02/2012 2009   MCV 81.0 12/02/2012 2009   MCH 28.5 12/02/2012 2009   MCHC 35.1 12/02/2012 2009   RDW 13.4 12/02/2012 2009   LYMPHSABS 0.7 12/02/2012 2009   MONOABS 0.5 12/02/2012 2009   EOSABS 0.0 12/02/2012 2009   BASOSABS 0.0 12/02/2012 2009    Hgb A1C Lab Results  Component Value Date   HGBA1C 5.9 11/14/2012        Assessment & Plan:   RTC in 1 year or sooner if needed

## 2013-02-12 NOTE — Patient Instructions (Signed)

## 2013-02-12 NOTE — Assessment & Plan Note (Signed)
Continue diet and exercise plan Will recheck lipids today If elevated, will put pt back on lipitor

## 2013-02-12 NOTE — Assessment & Plan Note (Signed)
Will trial testosterone injections monthly

## 2013-02-13 LAB — TESTOSTERONE, FREE, TOTAL, SHBG
Sex Hormone Binding: 16 nmol/L (ref 13–71)
Testosterone: 203 ng/dL — ABNORMAL LOW (ref 300–890)

## 2013-03-04 ENCOUNTER — Ambulatory Visit (INDEPENDENT_AMBULATORY_CARE_PROVIDER_SITE_OTHER): Payer: BC Managed Care – PPO | Admitting: *Deleted

## 2013-03-04 DIAGNOSIS — E349 Endocrine disorder, unspecified: Secondary | ICD-10-CM

## 2013-03-04 DIAGNOSIS — E291 Testicular hypofunction: Secondary | ICD-10-CM

## 2013-03-04 MED ORDER — TESTOSTERONE CYPIONATE 200 MG/ML IM SOLN
200.0000 mg | Freq: Once | INTRAMUSCULAR | Status: AC
  Administered 2013-03-04: 200 mg via INTRAMUSCULAR

## 2013-04-07 ENCOUNTER — Ambulatory Visit (INDEPENDENT_AMBULATORY_CARE_PROVIDER_SITE_OTHER): Payer: BC Managed Care – PPO

## 2013-04-07 DIAGNOSIS — E349 Endocrine disorder, unspecified: Secondary | ICD-10-CM

## 2013-04-07 DIAGNOSIS — E291 Testicular hypofunction: Secondary | ICD-10-CM

## 2013-04-07 MED ORDER — TESTOSTERONE CYPIONATE 200 MG/ML IM SOLN
200.0000 mg | Freq: Once | INTRAMUSCULAR | Status: AC
  Administered 2013-04-07: 200 mg via INTRAMUSCULAR

## 2013-05-06 ENCOUNTER — Ambulatory Visit (INDEPENDENT_AMBULATORY_CARE_PROVIDER_SITE_OTHER): Payer: BC Managed Care – PPO | Admitting: *Deleted

## 2013-05-06 DIAGNOSIS — E291 Testicular hypofunction: Secondary | ICD-10-CM

## 2013-05-06 DIAGNOSIS — E349 Endocrine disorder, unspecified: Secondary | ICD-10-CM

## 2013-05-06 MED ORDER — TESTOSTERONE CYPIONATE 200 MG/ML IM SOLN
200.0000 mg | Freq: Once | INTRAMUSCULAR | Status: AC
  Administered 2013-05-06: 200 mg via INTRAMUSCULAR

## 2013-06-05 ENCOUNTER — Ambulatory Visit: Payer: BC Managed Care – PPO

## 2013-06-06 ENCOUNTER — Ambulatory Visit (INDEPENDENT_AMBULATORY_CARE_PROVIDER_SITE_OTHER): Payer: BC Managed Care – PPO

## 2013-06-06 DIAGNOSIS — E291 Testicular hypofunction: Secondary | ICD-10-CM

## 2013-06-06 DIAGNOSIS — E349 Endocrine disorder, unspecified: Secondary | ICD-10-CM

## 2013-06-06 MED ORDER — TESTOSTERONE CYPIONATE 200 MG/ML IM SOLN
400.0000 mg | Freq: Once | INTRAMUSCULAR | Status: AC
  Administered 2013-06-06: 400 mg via INTRAMUSCULAR

## 2013-07-03 ENCOUNTER — Ambulatory Visit: Payer: BC Managed Care – PPO

## 2013-07-08 ENCOUNTER — Ambulatory Visit: Payer: BC Managed Care – PPO

## 2013-07-10 ENCOUNTER — Ambulatory Visit (INDEPENDENT_AMBULATORY_CARE_PROVIDER_SITE_OTHER): Payer: BC Managed Care – PPO

## 2013-07-10 DIAGNOSIS — E291 Testicular hypofunction: Secondary | ICD-10-CM

## 2013-07-10 DIAGNOSIS — E349 Endocrine disorder, unspecified: Secondary | ICD-10-CM

## 2013-07-10 MED ORDER — TESTOSTERONE CYPIONATE 200 MG/ML IM SOLN
200.0000 mg | Freq: Once | INTRAMUSCULAR | Status: AC
  Administered 2013-07-10: 200 mg via INTRAMUSCULAR

## 2013-08-07 ENCOUNTER — Ambulatory Visit (INDEPENDENT_AMBULATORY_CARE_PROVIDER_SITE_OTHER): Payer: BC Managed Care – PPO | Admitting: *Deleted

## 2013-08-07 DIAGNOSIS — E349 Endocrine disorder, unspecified: Secondary | ICD-10-CM

## 2013-08-07 DIAGNOSIS — E291 Testicular hypofunction: Secondary | ICD-10-CM

## 2013-08-07 MED ORDER — TESTOSTERONE CYPIONATE 200 MG/ML IM SOLN
200.0000 mg | Freq: Once | INTRAMUSCULAR | Status: AC
  Administered 2013-08-07: 200 mg via INTRAMUSCULAR

## 2013-09-03 ENCOUNTER — Ambulatory Visit (INDEPENDENT_AMBULATORY_CARE_PROVIDER_SITE_OTHER): Payer: BC Managed Care – PPO | Admitting: *Deleted

## 2013-09-03 DIAGNOSIS — E349 Endocrine disorder, unspecified: Secondary | ICD-10-CM

## 2013-09-03 DIAGNOSIS — E291 Testicular hypofunction: Secondary | ICD-10-CM

## 2013-09-03 MED ORDER — TESTOSTERONE CYPIONATE 200 MG/ML IM SOLN
200.0000 mg | Freq: Once | INTRAMUSCULAR | Status: AC
  Administered 2013-09-03: 200 mg via INTRAMUSCULAR

## 2013-10-01 ENCOUNTER — Ambulatory Visit (INDEPENDENT_AMBULATORY_CARE_PROVIDER_SITE_OTHER): Payer: BC Managed Care – PPO | Admitting: *Deleted

## 2013-10-01 ENCOUNTER — Ambulatory Visit: Payer: BC Managed Care – PPO

## 2013-10-01 DIAGNOSIS — E291 Testicular hypofunction: Secondary | ICD-10-CM

## 2013-10-01 MED ORDER — TESTOSTERONE CYPIONATE 200 MG/ML IM SOLN
200.0000 mg | Freq: Once | INTRAMUSCULAR | Status: AC
  Administered 2013-10-01: 200 mg via INTRAMUSCULAR

## 2013-10-16 ENCOUNTER — Other Ambulatory Visit: Payer: Self-pay

## 2013-10-31 ENCOUNTER — Ambulatory Visit (INDEPENDENT_AMBULATORY_CARE_PROVIDER_SITE_OTHER): Payer: BC Managed Care – PPO | Admitting: *Deleted

## 2013-10-31 DIAGNOSIS — E291 Testicular hypofunction: Secondary | ICD-10-CM

## 2013-10-31 MED ORDER — TESTOSTERONE CYPIONATE 200 MG/ML IM SOLN
200.0000 mg | Freq: Once | INTRAMUSCULAR | Status: AC
  Administered 2013-10-31: 200 mg via INTRAMUSCULAR

## 2013-11-28 ENCOUNTER — Other Ambulatory Visit: Payer: Self-pay | Admitting: *Deleted

## 2013-11-28 ENCOUNTER — Encounter: Payer: Self-pay | Admitting: Internal Medicine

## 2013-11-28 ENCOUNTER — Ambulatory Visit (INDEPENDENT_AMBULATORY_CARE_PROVIDER_SITE_OTHER): Payer: BC Managed Care – PPO | Admitting: Internal Medicine

## 2013-11-28 VITALS — BP 144/90 | HR 86 | Temp 98.0°F | Ht 71.0 in | Wt 258.8 lb

## 2013-11-28 DIAGNOSIS — N529 Male erectile dysfunction, unspecified: Secondary | ICD-10-CM

## 2013-11-28 DIAGNOSIS — E291 Testicular hypofunction: Secondary | ICD-10-CM

## 2013-11-28 DIAGNOSIS — E349 Endocrine disorder, unspecified: Secondary | ICD-10-CM

## 2013-11-28 DIAGNOSIS — F411 Generalized anxiety disorder: Secondary | ICD-10-CM

## 2013-11-28 MED ORDER — CITALOPRAM HYDROBROMIDE 20 MG PO TABS: 20.0000 mg | ORAL_TABLET | Freq: Every day | ORAL | Status: DC

## 2013-11-28 MED ORDER — TESTOSTERONE CYPIONATE 200 MG/ML IM SOLN: 200.0000 mg | INTRAMUSCULAR | Status: DC

## 2013-11-28 MED ORDER — TESTOSTERONE CYPIONATE 200 MG/ML IM SOLN
100.0000 mg | Freq: Once | INTRAMUSCULAR | Status: AC
  Administered 2013-11-28: 100 mg via INTRAMUSCULAR

## 2013-11-28 MED ORDER — TADALAFIL 20 MG PO TABS: 10.0000 mg | ORAL_TABLET | ORAL | Status: DC | PRN

## 2013-11-28 NOTE — Assessment & Plan Note (Signed)
Will trial cialis

## 2013-11-28 NOTE — Progress Notes (Signed)
Subjective:    Patient ID: Kenneth Sherman, male    DOB: Oct 18, 1971, 42 y.o.   MRN: [id number]  HPI  Pt presents to the clinic today for his annual exam. He does have some concerns about his anxiety. He is drinking vodka on an nightly basis to help with his nerves. He has no depression but the everyday stress is getting to him.  Flu: never Tetanus: 2013 Eye Doctor: yearly Dentist: biannually  Review of Systems      Past Medical History  Diagnosis Date  . Right knee DJD   . Hypertension     no meds    Current Outpatient Prescriptions  Medication Sig Dispense Refill  . testosterone cypionate (DEPO-TESTOSTERONE) 200 MG/ML injection Inject 1 mL (200 mg total) into the muscle every 28 (twenty-eight) days.  10 mL  0   No current facility-administered medications for this visit.   Facility-Administered Medications Ordered in Other Visits  Medication Dose Route Frequency Provider Last Rate Last Dose  . chlorhexidine (HIBICLENS) 4 % liquid 4 application  60 mL Topical Once Kirstin J Shepperson, PA-C        No Known Allergies  Family History  Problem Relation Age of Onset  . Heart attack Mother   . Hypertension Mother   . Hypertension Father   . Hypertension Brother   . Heart attack Father   . Heart attack Father     History   Social History  . Marital Status: Married    Spouse Name: N/A    Number of Children: N/A  . Years of Education: 14   Occupational History  . CAD Designer    Social History Main Topics  . Smoking status: Never Smoker   . Smokeless tobacco: Not on file  . Alcohol Use: 1.2 oz/week    1 Glasses of wine, 1 Cans of beer per week     Comment: occasionally  . Drug Use: No  . Sexual Activity: Yes    Birth Control/ Protection: Condom   Other Topics Concern  . Not on file   Social History Narrative   Regular exercise-no   Caffeine Use-yes     Constitutional: Denies fever, malaise, fatigue, headache or abrupt weight changes.  HEENT: Denies  eye pain, eye redness, ear pain, ringing in the ears, wax buildup, runny nose, nasal congestion, bloody nose, or sore throat. Respiratory: Denies difficulty breathing, shortness of breath, cough or sputum production.   Cardiovascular: Denies chest pain, chest tightness, palpitations or swelling in the hands or feet.  Gastrointestinal: Denies abdominal pain, bloating, constipation, diarrhea or blood in the stool.  GU: Denies urgency, frequency, pain with urination, burning sensation, blood in urine, odor or discharge. Musculoskeletal: Denies decrease in range of motion, difficulty with gait, muscle pain or joint pain and swelling.  Skin: Denies redness, rashes, lesions or ulcercations.  Neurological: Denies dizziness, difficulty with memory, difficulty with speech or problems with balance and coordination.   No other specific complaints in a complete review of systems (except as listed in HPI above).  Objective:   Physical Exam   BP 144/90  Pulse 86  Temp(Src) 98 F (36.7 C) (Oral)  Ht 5\' 11"  (1.803 m)  Wt 258 lb 12 oz (117.368 kg)  BMI 36.10 kg/m2  SpO2 96% Wt Readings from Last 3 Encounters:  11/28/13 258 lb 12 oz (117.368 kg)  02/12/13 247 lb 12.8 oz (112.401 kg)  11/20/12 251 lb 12.3 oz (114.2 kg)    General: Appears his stated age,  well developed, well nourished in NAD. Skin: Warm, dry and intact. No rashes, lesions or ulcerations noted. HEENT: Head: normal shape and size; Eyes: sclera white, no icterus, conjunctiva pink, PERRLA and EOMs intact; Ears: Tm's gray and intact, normal light reflex; Nose: mucosa pink and moist, septum midline; Throat/Mouth: Teeth present, mucosa pink and moist, no exudate, lesions or ulcerations noted.  Neck: Normal range of motion. Neck supple, trachea midline. No massses, lumps or thyromegaly present.  Cardiovascular: Normal rate and rhythm. S1,S2 noted.  No murmur, rubs or gallops noted. No JVD or BLE edema. No carotid bruits  noted. Pulmonary/Chest: Normal effort and positive vesicular breath sounds. No respiratory distress. No wheezes, rales or ronchi noted.  Abdomen: Soft and nontender. Normal bowel sounds, no bruits noted. No distention or masses noted. Liver, spleen and kidneys non palpable. Musculoskeletal: Normal range of motion. No signs of joint swelling. No difficulty with gait.  Neurological: Alert and oriented. Cranial nerves II-XII intact. Coordination normal. +DTRs bilaterally. Psychiatric: Mood and affect normal. Behavior is normal. Judgment and thought content normal.     BMET    Component Value Date/Time   NA 137 12/02/2012 2009   K 3.5 12/02/2012 2009   CL 102 12/02/2012 2009   CO2 25 12/02/2012 2009   GLUCOSE 101* 12/02/2012 2009   BUN 13 12/02/2012 2009   CREATININE 0.98 12/02/2012 2009   CALCIUM 8.4 12/02/2012 2009   GFRNONAA >90 12/02/2012 2009   GFRAA >90 12/02/2012 2009    Lipid Panel     Component Value Date/Time   CHOL 180 02/12/2013 1337   TRIG 205.0* 02/12/2013 1337   HDL 32.90* 02/12/2013 1337   CHOLHDL 5 02/12/2013 1337   VLDL 41.0* 02/12/2013 1337    CBC    Component Value Date/Time   WBC 6.6 12/02/2012 2009   RBC 4.53 12/02/2012 2009   HGB 12.9* 12/02/2012 2009   HCT 36.7* 12/02/2012 2009   PLT 205 12/02/2012 2009   MCV 81.0 12/02/2012 2009   MCH 28.5 12/02/2012 2009   MCHC 35.1 12/02/2012 2009   RDW 13.4 12/02/2012 2009   LYMPHSABS 0.7 12/02/2012 2009   MONOABS 0.5 12/02/2012 2009   EOSABS 0.0 12/02/2012 2009   BASOSABS 0.0 12/02/2012 2009    Hgb A1C Lab Results  Component Value Date   HGBA1C 5.9 11/14/2012        Assessment & Plan:   Preventative health maintenance:  Lab work in 02/2013 reviewed. Will repeat labs at your next physical Advised pt to work on diet and exercise Flu shot declined today  RTC in 1 year or sooner if needed

## 2013-11-28 NOTE — Assessment & Plan Note (Signed)
Will start Celexa Call me in 1 month to let me know how you are doing

## 2013-11-28 NOTE — Addendum Note (Signed)
Addended by: Desmond Dike on: 11/28/2013 04:42 PM   Modules accepted: Orders

## 2013-11-28 NOTE — Progress Notes (Signed)
Pre-visit discussion using our clinic review tool. No additional management support is needed unless otherwise documented below in the visit note.  

## 2013-11-28 NOTE — Patient Instructions (Signed)
Health Maintenance, Males A healthy lifestyle and preventative care can promote health and wellness.  Maintain regular health, dental, and eye exams.  Eat a healthy diet. Foods like vegetables, fruits, whole grains, low-fat dairy products, and lean protein foods contain the nutrients you need without too many calories. Decrease your intake of foods high in solid fats, added sugars, and salt. Get information about a proper diet from your caregiver, if necessary.  Regular physical exercise is one of the most important things you can do for your health. Most adults should get at least 150 minutes of moderate-intensity exercise (any activity that increases your heart rate and causes you to sweat) each week. In addition, most adults need muscle-strengthening exercises on 2 or more days a week.   Maintain a healthy weight. The body mass index (BMI) is a screening tool to identify possible weight problems. It provides an estimate of body fat based on height and weight. Your caregiver can help determine your BMI, and can help you achieve or maintain a healthy weight. For adults 20 years and older:  A BMI below 18.5 is considered underweight.  A BMI of 18.5 to 24.9 is normal.  A BMI of 25 to 29.9 is considered overweight.  A BMI of 30 and above is considered obese.  Maintain normal blood lipids and cholesterol by exercising and minimizing your intake of saturated fat. Eat a balanced diet with plenty of fruits and vegetables. Blood tests for lipids and cholesterol should begin at age 20 and be repeated every 5 years. If your lipid or cholesterol levels are high, you are over 50, or you are a high risk for heart disease, you may need your cholesterol levels checked more frequently.Ongoing high lipid and cholesterol levels should be treated with medicines, if diet and exercise are not effective.  If you smoke, find out from your caregiver how to quit. If you do not use tobacco, do not start.  Lung  cancer screening is recommended for adults aged 55 80 years who are at high risk for developing lung cancer because of a history of smoking. Yearly low-dose computed tomography (CT) is recommended for people who have at least a 30-pack-year history of smoking and are a current smoker or have quit within the past 15 years. A pack year of smoking is smoking an average of 1 pack of cigarettes a day for 1 year (for example: 1 pack a day for 30 years or 2 packs a day for 15 years). Yearly screening should continue until the smoker has stopped smoking for at least 15 years. Yearly screening should also be stopped for people who develop a health problem that would prevent them from having lung cancer treatment.  If you choose to drink alcohol, do not exceed 2 drinks per day. One drink is considered to be 12 ounces (355 mL) of beer, 5 ounces (148 mL) of wine, or 1.5 ounces (44 mL) of liquor.  Avoid use of street drugs. Do not share needles with anyone. Ask for help if you need support or instructions about stopping the use of drugs.  High blood pressure causes heart disease and increases the risk of stroke. Blood pressure should be checked at least every 1 to 2 years. Ongoing high blood pressure should be treated with medicines if weight loss and exercise are not effective.  If you are 45 to 43 years old, ask your caregiver if you should take aspirin to prevent heart disease.  Diabetes screening involves taking a blood   sample to check your fasting blood sugar level. This should be done once every 3 years, after age 45, if you are within normal weight and without risk factors for diabetes. Testing should be considered at a younger age or be carried out more frequently if you are overweight and have at least 1 risk factor for diabetes.  Colorectal cancer can be detected and often prevented. Most routine colorectal cancer screening begins at the age of 50 and continues through age 75. However, your caregiver may  recommend screening at an earlier age if you have risk factors for colon cancer. On a yearly basis, your caregiver may provide home test kits to check for hidden blood in the stool. Use of a small camera at the end of a tube, to directly examine the colon (sigmoidoscopy or colonoscopy), can detect the earliest forms of colorectal cancer. Talk to your caregiver about this at age 50, when routine screening begins. Direct examination of the colon should be repeated every 5 to 10 years through age 75, unless early forms of pre-cancerous polyps or small growths are found.  Hepatitis C blood testing is recommended for all people born from 1945 through 1965 and any individual with known risks for hepatitis C.  Healthy men should no longer receive prostate-specific antigen (PSA) blood tests as part of routine cancer screening. Consult with your caregiver about prostate cancer screening.  Testicular cancer screening is not recommended for adolescents or adult males who have no symptoms. Screening includes self-exam, caregiver exam, and other screening tests. Consult with your caregiver about any symptoms you have or any concerns you have about testicular cancer.  Practice safe sex. Use condoms and avoid high-risk sexual practices to reduce the spread of sexually transmitted infections (STIs).  Use sunscreen. Apply sunscreen liberally and repeatedly throughout the day. You should seek shade when your shadow is shorter than you. Protect yourself by wearing long sleeves, pants, a wide-brimmed hat, and sunglasses year round, whenever you are outdoors.  Notify your caregiver of new moles or changes in moles, especially if there is a change in shape or color. Also notify your caregiver if a mole is larger than the size of a pencil eraser.  A one-time screening for abdominal aortic aneurysm (AAA) and surgical repair of large AAAs by sound wave imaging (ultrasonography) is recommended for ages 65 to 75 years who are  current or former smokers.  Stay current with your immunizations. Document Released: 05/25/2008 Document Revised: 03/24/2013 Document Reviewed: 04/24/2011 [employer] Patient Information 2014 [employer].  

## 2013-11-28 NOTE — Telephone Encounter (Signed)
Rx called into pharmacy as requested per [facility]

## 2013-11-28 NOTE — Assessment & Plan Note (Signed)
Testosterone injection today. °

## 2013-12-02 ENCOUNTER — Encounter: Payer: BC Managed Care – PPO | Admitting: Internal Medicine

## 2013-12-08 ENCOUNTER — Telehealth: Payer: Self-pay | Admitting: *Deleted

## 2013-12-08 NOTE — Telephone Encounter (Signed)
Recieved prior auth request from pharmacy for .  I obtained authorization  from insurance, and I notified pharmacy of approval via fax. Waiting on approval notice from insurance company to arrive via fax. Once forms are received I will send forms to be signed by provider then scanned into patients medical record.

## 2013-12-25 ENCOUNTER — Ambulatory Visit (INDEPENDENT_AMBULATORY_CARE_PROVIDER_SITE_OTHER): Payer: BC Managed Care – PPO | Admitting: *Deleted

## 2013-12-25 DIAGNOSIS — E291 Testicular hypofunction: Secondary | ICD-10-CM

## 2013-12-25 MED ORDER — TESTOSTERONE CYPIONATE 200 MG/ML IM SOLN
200.0000 mg | Freq: Once | INTRAMUSCULAR | Status: AC
  Administered 2013-12-25: 200 mg via INTRAMUSCULAR

## 2014-01-27 ENCOUNTER — Ambulatory Visit: Payer: BC Managed Care – PPO

## 2014-02-12 ENCOUNTER — Ambulatory Visit (INDEPENDENT_AMBULATORY_CARE_PROVIDER_SITE_OTHER): Payer: BC Managed Care – PPO

## 2014-02-12 DIAGNOSIS — E291 Testicular hypofunction: Secondary | ICD-10-CM

## 2014-02-12 MED ORDER — TESTOSTERONE CYPIONATE 200 MG/ML IM SOLN
200.0000 mg | Freq: Once | INTRAMUSCULAR | Status: AC
  Administered 2014-02-12: 200 mg via INTRAMUSCULAR

## 2014-03-12 ENCOUNTER — Ambulatory Visit (INDEPENDENT_AMBULATORY_CARE_PROVIDER_SITE_OTHER): Payer: BC Managed Care – PPO

## 2014-03-12 DIAGNOSIS — E349 Endocrine disorder, unspecified: Secondary | ICD-10-CM

## 2014-03-12 DIAGNOSIS — E291 Testicular hypofunction: Secondary | ICD-10-CM

## 2014-03-12 MED ORDER — TESTOSTERONE CYPIONATE 200 MG/ML IM SOLN
200.0000 mg | Freq: Once | INTRAMUSCULAR | Status: AC
  Administered 2014-03-12: 200 mg via INTRAMUSCULAR

## 2014-03-23 ENCOUNTER — Encounter: Payer: Self-pay | Admitting: Internal Medicine

## 2014-03-26 ENCOUNTER — Ambulatory Visit: Payer: BC Managed Care – PPO | Admitting: Internal Medicine

## 2014-03-26 DIAGNOSIS — Z0289 Encounter for other administrative examinations: Secondary | ICD-10-CM

## 2014-04-01 ENCOUNTER — Other Ambulatory Visit: Payer: Self-pay | Admitting: Internal Medicine

## 2014-04-09 ENCOUNTER — Ambulatory Visit (INDEPENDENT_AMBULATORY_CARE_PROVIDER_SITE_OTHER): Payer: BC Managed Care – PPO

## 2014-04-09 DIAGNOSIS — N529 Male erectile dysfunction, unspecified: Secondary | ICD-10-CM

## 2014-04-09 MED ORDER — TESTOSTERONE CYPIONATE 200 MG/ML IM SOLN
200.0000 mg | Freq: Once | INTRAMUSCULAR | Status: AC
  Administered 2014-04-09: 200 mg via INTRAMUSCULAR

## 2014-05-13 ENCOUNTER — Ambulatory Visit (INDEPENDENT_AMBULATORY_CARE_PROVIDER_SITE_OTHER): Payer: BC Managed Care – PPO

## 2014-05-13 DIAGNOSIS — E291 Testicular hypofunction: Secondary | ICD-10-CM

## 2014-05-13 MED ORDER — TESTOSTERONE CYPIONATE 200 MG/ML IM SOLN
200.0000 mg | Freq: Once | INTRAMUSCULAR | Status: AC
  Administered 2014-05-13: 200 mg via INTRAMUSCULAR

## 2014-05-14 ENCOUNTER — Encounter: Payer: Self-pay | Admitting: Internal Medicine

## 2014-05-14 ENCOUNTER — Ambulatory Visit (INDEPENDENT_AMBULATORY_CARE_PROVIDER_SITE_OTHER): Payer: BC Managed Care – PPO | Admitting: Internal Medicine

## 2014-05-14 VITALS — BP 132/90 | HR 79 | Temp 98.1°F | Wt 266.0 lb

## 2014-05-14 DIAGNOSIS — F411 Generalized anxiety disorder: Secondary | ICD-10-CM

## 2014-05-14 MED ORDER — CITALOPRAM HYDROBROMIDE 20 MG PO TABS: 20.0000 mg | ORAL_TABLET | Freq: Every day | ORAL | Status: DC

## 2014-05-14 NOTE — Progress Notes (Signed)
Pre visit review using our clinic review tool, if applicable. No additional management support is needed unless otherwise documented below in the visit note. 

## 2014-05-14 NOTE — Progress Notes (Signed)
Subjective:    Patient ID: Kenneth Sherman, male    DOB: 1971-08-10, 43 y.o.   MRN: [id number]  HPI  Pt presents to the clinic today for med refill. He is in need of his celexa. He reports the medication works great. He denies side effects. He denies depression, SI/HI.  Review of Systems      Past Medical History  Diagnosis Date  . Right knee DJD   . Hypertension     no meds    Current Outpatient Prescriptions  Medication Sig Dispense Refill  . citalopram (CELEXA) 20 MG tablet Take 1 tablet (20 mg total) by mouth daily.  30 tablet  2  . tadalafil (CIALIS) 20 MG tablet Take 0.5-1 tablets (10-20 mg total) by mouth every other day as needed for erectile dysfunction.  10 tablet  0  . testosterone cypionate (DEPO-TESTOSTERONE) 200 MG/ML injection Inject 1 mL (200 mg total) into the muscle every 28 (twenty-eight) days.  10 mL  0   No current facility-administered medications for this visit.   Facility-Administered Medications Ordered in Other Visits  Medication Dose Route Frequency Provider Last Rate Last Dose  . chlorhexidine (HIBICLENS) 4 % liquid 4 application  60 mL Topical Once Kirstin J Shepperson, PA-C        No Known Allergies  Family History  Problem Relation Age of Onset  . Heart attack Mother   . Hypertension Mother   . Hypertension Father   . Hypertension Brother   . Heart attack Father   . Heart attack Father     History   Social History  . Marital Status: Married    Spouse Name: N/A    Number of Children: N/A  . Years of Education: 14   Occupational History  . CAD Designer    Social History Main Topics  . Smoking status: Never Smoker   . Smokeless tobacco: Not on file  . Alcohol Use: 1.2 oz/week    1 Glasses of wine, 1 Cans of beer per week     Comment: occasionally  . Drug Use: No  . Sexual Activity: Yes    Birth Control/ Protection: Condom   Other Topics Concern  . Not on file   Social History Narrative   Regular exercise-no   Caffeine  Use-yes     Constitutional: Denies fever, malaise, fatigue, headache or abrupt weight changes.  Psych: Pt reports anxiety. Denies depression, SI/HI.  No other specific complaints in a complete review of systems (except as listed in HPI above).  Objective:   Physical Exam   BP 132/90  Pulse 79  Temp(Src) 98.1 F (36.7 C) (Oral)  Wt 266 lb (120.657 kg)  SpO2 97% Wt Readings from Last 3 Encounters:  05/14/14 266 lb (120.657 kg)  11/28/13 258 lb 12 oz (117.368 kg)  02/12/13 247 lb 12.8 oz (112.401 kg)    General: Appears his stated age, well developed, well nourished in NAD. Cardiovascular: Normal rate and rhythm. S1,S2 noted.  No murmur, rubs or gallops noted. No JVD or BLE edema. No carotid bruits noted. Pulmonary/Chest: Normal effort and positive vesicular breath sounds. No respiratory distress. No wheezes, rales or ronchi noted.  Psychiatric: Mood and affect normal. Behavior is normal. Judgment and thought content normal.     BMET    Component Value Date/Time   NA 137 12/02/2012 2009   K 3.5 12/02/2012 2009   CL 102 12/02/2012 2009   CO2 25 12/02/2012 2009   GLUCOSE 101* 12/02/2012 2009  BUN 13 12/02/2012 2009   CREATININE 0.98 12/02/2012 2009   CALCIUM 8.4 12/02/2012 2009   GFRNONAA >90 12/02/2012 2009   GFRAA >90 12/02/2012 2009    Lipid Panel     Component Value Date/Time   CHOL 180 02/12/2013 1337   TRIG 205.0* 02/12/2013 1337   HDL 32.90* 02/12/2013 1337   CHOLHDL 5 02/12/2013 1337   VLDL 41.0* 02/12/2013 1337    CBC    Component Value Date/Time   WBC 6.6 12/02/2012 2009   RBC 4.53 12/02/2012 2009   HGB 12.9* 12/02/2012 2009   HCT 36.7* 12/02/2012 2009   PLT 205 12/02/2012 2009   MCV 81.0 12/02/2012 2009   MCH 28.5 12/02/2012 2009   MCHC 35.1 12/02/2012 2009   RDW 13.4 12/02/2012 2009   LYMPHSABS 0.7 12/02/2012 2009   MONOABS 0.5 12/02/2012 2009   EOSABS 0.0 12/02/2012 2009   BASOSABS 0.0 12/02/2012 2009    Hgb A1C Lab Results  Component Value  Date   HGBA1C 5.9 11/14/2012        Assessment & Plan:

## 2014-05-14 NOTE — Assessment & Plan Note (Signed)
Well controlled on celexa Will continue current therapy for now

## 2014-05-14 NOTE — Patient Instructions (Addendum)

## 2014-06-16 ENCOUNTER — Ambulatory Visit: Payer: BC Managed Care – PPO

## 2014-06-17 ENCOUNTER — Ambulatory Visit (INDEPENDENT_AMBULATORY_CARE_PROVIDER_SITE_OTHER): Payer: BC Managed Care – PPO | Admitting: Internal Medicine

## 2014-06-17 ENCOUNTER — Encounter: Payer: Self-pay | Admitting: Internal Medicine

## 2014-06-17 VITALS — BP 124/82 | HR 70 | Temp 98.3°F | Ht 71.0 in | Wt 261.0 lb

## 2014-06-17 DIAGNOSIS — E291 Testicular hypofunction: Secondary | ICD-10-CM

## 2014-06-17 DIAGNOSIS — E349 Endocrine disorder, unspecified: Secondary | ICD-10-CM

## 2014-06-17 DIAGNOSIS — Z0000 Encounter for general adult medical examination without abnormal findings: Secondary | ICD-10-CM

## 2014-06-17 LAB — LIPID PANEL
CHOLESTEROL: 227 mg/dL — AB (ref 0–200)
HDL: 36.9 mg/dL — ABNORMAL LOW (ref >39.00)
LDL CALC: 130 mg/dL — AB (ref 0–99)
NONHDL: 190.1
Total CHOL/HDL Ratio: 6
Triglycerides: 303 mg/dL — ABNORMAL HIGH (ref 0.0–149.0)
VLDL: 60.6 mg/dL — ABNORMAL HIGH (ref 0.0–40.0)

## 2014-06-17 LAB — COMPREHENSIVE METABOLIC PANEL
ALBUMIN: 4.4 g/dL (ref 3.5–5.2)
ALT: 31 U/L (ref 0–53)
AST: 24 U/L (ref 0–37)
Alkaline Phosphatase: 71 U/L (ref 39–117)
BUN: 10 mg/dL (ref 6–23)
CO2: 27 meq/L (ref 19–32)
Calcium: 9 mg/dL (ref 8.4–10.5)
Chloride: 100 mEq/L (ref 96–112)
Creatinine, Ser: 1.1 mg/dL (ref 0.4–1.5)
GFR: 80.7 mL/min (ref >60.00)
GLUCOSE: 101 mg/dL — AB (ref 70–99)
POTASSIUM: 4.1 meq/L (ref 3.5–5.1)
SODIUM: 136 meq/L (ref 135–145)
TOTAL PROTEIN: 7.9 g/dL (ref 6.0–8.3)
Total Bilirubin: 0.7 mg/dL (ref 0.2–1.2)

## 2014-06-17 LAB — CBC
HCT: 45.8 % (ref 39.0–52.0)
Hemoglobin: 15.5 g/dL (ref 13.0–17.0)
MCHC: 33.8 g/dL (ref 30.0–36.0)
MCV: 83.9 fl (ref 78.0–100.0)
Platelets: 290 10*3/uL (ref 150.0–400.0)
RBC: 5.45 Mil/uL (ref 4.22–5.81)
RDW: 13.6 % (ref 11.5–15.5)
WBC: 9 10*3/uL (ref 4.0–10.5)

## 2014-06-17 LAB — TESTOSTERONE: TESTOSTERONE: 210.45 ng/dL — AB (ref 300.00–890.00)

## 2014-06-17 MED ORDER — TESTOSTERONE CYPIONATE 200 MG/ML IM SOLN
200.0000 mg | INTRAMUSCULAR | Status: DC
  Administered 2014-06-17: 200 mg via INTRAMUSCULAR

## 2014-06-17 MED ORDER — TESTOSTERONE CYPIONATE 200 MG/ML IM SOLN: 200.0000 mg | INTRAMUSCULAR | Status: DC

## 2014-06-17 NOTE — Patient Instructions (Addendum)

## 2014-06-17 NOTE — Progress Notes (Signed)
Pre visit review using our clinic review tool, if applicable. No additional management support is needed unless otherwise documented below in the visit note. 

## 2014-06-17 NOTE — Progress Notes (Signed)
Subjective:    Patient ID: Kenneth Sherman, male    DOB: 10-01-1971, 43 y.o.   MRN: [id number]  HPI  Pt presents to the clinic today for his annual exam. He has no concerns today.  Flu: never Tetanus: 2013 Eye Doctor: yearly Dentist: yearly  Review of Systems      Past Medical History  Diagnosis Date  . Right knee DJD   . Hypertension     no meds    Current Outpatient Prescriptions  Medication Sig Dispense Refill  . citalopram (CELEXA) 20 MG tablet Take 1 tablet (20 mg total) by mouth daily.  30 tablet  5  . tadalafil (CIALIS) 20 MG tablet Take 0.5-1 tablets (10-20 mg total) by mouth every other day as needed for erectile dysfunction.  10 tablet  0  . testosterone cypionate (DEPO-TESTOSTERONE) 200 MG/ML injection Inject 1 mL (200 mg total) into the muscle every 28 (twenty-eight) days.  10 mL  0   No current facility-administered medications for this visit.   Facility-Administered Medications Ordered in Other Visits  Medication Dose Route Frequency Provider Last Rate Last Dose  . chlorhexidine (HIBICLENS) 4 % liquid 4 application  60 mL Topical Once Kirstin J Shepperson, PA-C        No Known Allergies  Family History  Problem Relation Age of Onset  . Heart attack Mother   . Hypertension Mother   . Hypertension Father   . Hypertension Brother   . Heart attack Father   . Heart attack Father     History   Social History  . Marital Status: Married    Spouse Name: N/A    Number of Children: N/A  . Years of Education: 14   Occupational History  . CAD Designer    Social History Main Topics  . Smoking status: Never Smoker   . Smokeless tobacco: Not on file  . Alcohol Use: 1.2 oz/week    1 Glasses of wine, 1 Cans of beer per week     Comment: occasionally  . Drug Use: No  . Sexual Activity: Yes    Birth Control/ Protection: Condom   Other Topics Concern  . Not on file   Social History Narrative   Regular exercise-no   Caffeine Use-yes      Constitutional: Denies fever, malaise, fatigue, headache or abrupt weight changes.  HEENT: Denies eye pain, eye redness, ear pain, ringing in the ears, wax buildup, runny nose, nasal congestion, bloody nose, or sore throat. Respiratory: Denies difficulty breathing, shortness of breath, cough or sputum production.   Cardiovascular: Denies chest pain, chest tightness, palpitations or swelling in the hands or feet.  Gastrointestinal: Denies abdominal pain, bloating, constipation, diarrhea or blood in the stool.  GU: Denies urgency, frequency, pain with urination, burning sensation, blood in urine, odor or discharge. Musculoskeletal: Denies decrease in range of motion, difficulty with gait, muscle pain or joint pain and swelling.  Skin: Denies redness, rashes, lesions or ulcercations.  Neurological: Denies dizziness, difficulty with memory, difficulty with speech or problems with balance and coordination.   No other specific complaints in a complete review of systems (except as listed in HPI above).  Objective:   Physical Exam   BP 124/82  Pulse 70  Temp(Src) 98.3 F (36.8 C) (Oral)  Ht 5\' 11"  (1.803 m)  Wt 261 lb (118.389 kg)  BMI 36.42 kg/m2  SpO2 98% Wt Readings from Last 3 Encounters:  06/17/14 261 lb (118.389 kg)  05/14/14 266 lb (120.657 kg)  11/28/13 258 lb 12 oz (117.368 kg)    General: Appears his stated age, well developed, well nourished in NAD. Skin: Warm, dry and intact. No rashes, lesions or ulcerations noted. HEENT: Head: normal shape and size; Eyes: sclera white, no icterus, conjunctiva pink, PERRLA and EOMs intact; Ears: Tm's gray and intact, normal light reflex; Nose: mucosa pink and moist, septum midline; Throat/Mouth: Teeth present, mucosa pink and moist, no exudate, lesions or ulcerations noted.  Neck: Normal range of motion. Neck supple, trachea midline. No massses, lumps or thyromegaly present.  Cardiovascular: Normal rate and rhythm. S1,S2 noted.  No  murmur, rubs or gallops noted. No JVD or BLE edema. No carotid bruits noted. Pulmonary/Chest: Normal effort and positive vesicular breath sounds. No respiratory distress. No wheezes, rales or ronchi noted.  Abdomen: Soft and nontender. Normal bowel sounds, no bruits noted. No distention or masses noted. Liver, spleen and kidneys non palpable. Musculoskeletal: Normal range of motion. No signs of joint swelling. No difficulty with gait.  Neurological: Alert and oriented. Cranial nerves II-XII intact. Coordination normal. +DTRs bilaterally. Psychiatric: Mood and affect normal. Behavior is normal. Judgment and thought content normal.     BMET    Component Value Date/Time   NA 137 12/02/2012 2009   K 3.5 12/02/2012 2009   CL 102 12/02/2012 2009   CO2 25 12/02/2012 2009   GLUCOSE 101* 12/02/2012 2009   BUN 13 12/02/2012 2009   CREATININE 0.98 12/02/2012 2009   CALCIUM 8.4 12/02/2012 2009   GFRNONAA >90 12/02/2012 2009   GFRAA >90 12/02/2012 2009    Lipid Panel     Component Value Date/Time   CHOL 180 02/12/2013 1337   TRIG 205.0* 02/12/2013 1337   HDL 32.90* 02/12/2013 1337   CHOLHDL 5 02/12/2013 1337   VLDL 41.0* 02/12/2013 1337    CBC    Component Value Date/Time   WBC 6.6 12/02/2012 2009   RBC 4.53 12/02/2012 2009   HGB 12.9* 12/02/2012 2009   HCT 36.7* 12/02/2012 2009   PLT 205 12/02/2012 2009   MCV 81.0 12/02/2012 2009   MCH 28.5 12/02/2012 2009   MCHC 35.1 12/02/2012 2009   RDW 13.4 12/02/2012 2009   LYMPHSABS 0.7 12/02/2012 2009   MONOABS 0.5 12/02/2012 2009   EOSABS 0.0 12/02/2012 2009   BASOSABS 0.0 12/02/2012 2009    Hgb A1C Lab Results  Component Value Date   HGBA1C 5.9 11/14/2012        Assessment & Plan:   Preventative Health Maintenance:  Will check labs today including testosterone Encouraged him to work on diet and exercise  RTC in 1 year or sooner if needed

## 2014-06-18 ENCOUNTER — Other Ambulatory Visit: Payer: Self-pay

## 2014-06-18 NOTE — Telephone Encounter (Signed)
Faxed in testosterone Rx to pharmacy as he did not receive at his appt

## 2014-07-15 ENCOUNTER — Other Ambulatory Visit: Payer: Self-pay | Admitting: Internal Medicine

## 2014-07-15 ENCOUNTER — Ambulatory Visit (INDEPENDENT_AMBULATORY_CARE_PROVIDER_SITE_OTHER): Payer: BC Managed Care – PPO

## 2014-07-15 ENCOUNTER — Encounter: Payer: Self-pay | Admitting: Internal Medicine

## 2014-07-15 DIAGNOSIS — E349 Endocrine disorder, unspecified: Secondary | ICD-10-CM

## 2014-07-15 DIAGNOSIS — E291 Testicular hypofunction: Secondary | ICD-10-CM

## 2014-07-15 MED ORDER — TESTOSTERONE CYPIONATE 200 MG/ML IM SOLN: 200.0000 mg | INTRAMUSCULAR | Status: DC

## 2014-07-15 MED ORDER — TESTOSTERONE CYPIONATE 200 MG/ML IM SOLN
200.0000 mg | Freq: Once | INTRAMUSCULAR | Status: AC
  Administered 2014-07-15: 200 mg via INTRAMUSCULAR

## 2014-08-13 ENCOUNTER — Ambulatory Visit (INDEPENDENT_AMBULATORY_CARE_PROVIDER_SITE_OTHER): Payer: BC Managed Care – PPO

## 2014-08-13 DIAGNOSIS — E291 Testicular hypofunction: Secondary | ICD-10-CM

## 2014-08-13 MED ORDER — TESTOSTERONE CYPIONATE 200 MG/ML IM SOLN
200.0000 mg | Freq: Once | INTRAMUSCULAR | Status: AC
  Administered 2014-08-13: 200 mg via INTRAMUSCULAR

## 2014-09-15 ENCOUNTER — Ambulatory Visit: Payer: BC Managed Care – PPO | Admitting: Internal Medicine

## 2014-09-15 DIAGNOSIS — Z0289 Encounter for other administrative examinations: Secondary | ICD-10-CM

## 2014-10-05 ENCOUNTER — Encounter: Payer: Self-pay | Admitting: Internal Medicine

## 2014-10-05 ENCOUNTER — Ambulatory Visit (INDEPENDENT_AMBULATORY_CARE_PROVIDER_SITE_OTHER): Payer: BC Managed Care – PPO | Admitting: Internal Medicine

## 2014-10-05 VITALS — BP 130/84 | HR 72 | Temp 98.3°F | Wt 265.0 lb

## 2014-10-05 DIAGNOSIS — E349 Endocrine disorder, unspecified: Secondary | ICD-10-CM

## 2014-10-05 DIAGNOSIS — N529 Male erectile dysfunction, unspecified: Secondary | ICD-10-CM

## 2014-10-05 DIAGNOSIS — E291 Testicular hypofunction: Secondary | ICD-10-CM

## 2014-10-05 DIAGNOSIS — E785 Hyperlipidemia, unspecified: Secondary | ICD-10-CM

## 2014-10-05 MED ORDER — TESTOSTERONE CYPIONATE 200 MG/ML IM SOLN
200.0000 mg | Freq: Once | INTRAMUSCULAR | Status: AC
  Administered 2014-10-05: 200 mg via INTRAMUSCULAR

## 2014-10-05 NOTE — Patient Instructions (Addendum)

## 2014-10-06 ENCOUNTER — Encounter: Payer: Self-pay | Admitting: Internal Medicine

## 2014-10-06 ENCOUNTER — Other Ambulatory Visit: Payer: Self-pay | Admitting: Internal Medicine

## 2014-10-06 DIAGNOSIS — E669 Obesity, unspecified: Secondary | ICD-10-CM | POA: Insufficient documentation

## 2014-10-06 DIAGNOSIS — E785 Hyperlipidemia, unspecified: Secondary | ICD-10-CM

## 2014-10-06 LAB — LDL CHOLESTEROL, DIRECT: Direct LDL: 134.9 mg/dL

## 2014-10-06 LAB — LIPID PANEL
CHOLESTEROL: 210 mg/dL — AB (ref 0–200)
HDL: 35.8 mg/dL — ABNORMAL LOW (ref >39.00)
NonHDL: 174.2
Total CHOL/HDL Ratio: 6
Triglycerides: 405 mg/dL — ABNORMAL HIGH (ref 0.0–149.0)
VLDL: 81 mg/dL — AB (ref 0.0–40.0)

## 2014-10-06 MED ORDER — ATORVASTATIN CALCIUM 10 MG PO TABS
10.0000 mg | ORAL_TABLET | Freq: Every day | ORAL | Status: DC
Start: 2014-10-06 — End: 2015-01-04

## 2014-10-06 NOTE — Assessment & Plan Note (Signed)
Last testosterone level reviewed Will give injection today  Plan to recheck testosterone in 6 months.

## 2014-10-06 NOTE — Progress Notes (Signed)
Subjective:    Patient ID: Kenneth Sherman, male    DOB: Nov 19, 1971, 43 y.o.   MRN: [id number]  HPI  Pt presents to the clinic today for 3 month follow up of HDL. His total cholesterol was 227, LDL 130, HDL 36 and triglycerides of 303. He reports he has been taking the fish oil but not consistently. He has tried cutting out fried and fatty foods. He has gained 4 lbs over the last few months.  Additionally, he wants to get his testosterone injection while he is here today. His last testosterone level was reviewed.  Review of Systems      Past Medical History  Diagnosis Date  . Right knee DJD   . Hypertension     no meds    Current Outpatient Prescriptions  Medication Sig Dispense Refill  . citalopram (CELEXA) 20 MG tablet Take 1 tablet (20 mg total) by mouth daily.  30 tablet  5  . tadalafil (CIALIS) 20 MG tablet Take 0.5-1 tablets (10-20 mg total) by mouth every other day as needed for erectile dysfunction.  10 tablet  0  . testosterone cypionate (DEPO-TESTOSTERONE) 200 MG/ML injection Inject 1 mL (200 mg total) into the muscle every 28 (twenty-eight) days.  10 mL  0   No current facility-administered medications for this visit.   Facility-Administered Medications Ordered in Other Visits  Medication Dose Route Frequency Provider Last Rate Last Dose  . chlorhexidine (HIBICLENS) 4 % liquid 4 application  60 mL Topical Once Kirstin J Shepperson, PA-C        No Known Allergies  Family History  Problem Relation Age of Onset  . Heart attack Mother   . Hypertension Mother   . Hypertension Father   . Hypertension Brother   . Heart attack Father   . Heart attack Father     History   Social History  . Marital Status: Married    Spouse Name: N/A    Number of Children: N/A  . Years of Education: 14   Occupational History  . CAD Designer    Social History Main Topics  . Smoking status: Never Smoker   . Smokeless tobacco: Not on file  . Alcohol Use: 1.2 oz/week    1  Glasses of wine, 1 Cans of beer per week     Comment: occasionally  . Drug Use: No  . Sexual Activity: Yes    Birth Control/ Protection: Condom   Other Topics Concern  . Not on file   Social History Narrative   Regular exercise-no   Caffeine Use-yes     Constitutional: Pt reports fatigue. Denies fever, malaise, headache or abrupt weight changes.  Respiratory: Denies difficulty breathing, shortness of breath, cough or sputum production.   Cardiovascular: Denies chest pain, chest tightness, palpitations or swelling in the hands or feet.  Neurological: Denies dizziness, difficulty with memory, difficulty with speech or problems with balance and coordination.   No other specific complaints in a complete review of systems (except as listed in HPI above).  Objective:   Physical Exam  BP 130/84  Pulse 72  Temp(Src) 98.3 F (36.8 C) (Oral)  Wt 265 lb (120.203 kg)  SpO2 98% Wt Readings from Last 3 Encounters:  10/05/14 265 lb (120.203 kg)  06/17/14 261 lb (118.389 kg)  05/14/14 266 lb (120.657 kg)    General: Appears his stated age, obese but well developed, well nourished in NAD. Skin: Warm, dry and intact. No rashes, lesions or ulcerations noted. Cardiovascular: Normal  rate and rhythm. S1,S2 noted.  No murmur, rubs or gallops noted. No JVD or BLE edema. No carotid bruits noted. Pulmonary/Chest: Normal effort and positive vesicular breath sounds. No respiratory distress. No wheezes, rales or ronchi noted.   Neurological: Alert and oriented.   BMET    Component Value Date/Time   NA 136 06/17/2014 1507   K 4.1 06/17/2014 1507   CL 100 06/17/2014 1507   CO2 27 06/17/2014 1507   GLUCOSE 101* 06/17/2014 1507   BUN 10 06/17/2014 1507   CREATININE 1.1 06/17/2014 1507   CALCIUM 9.0 06/17/2014 1507   GFRNONAA >90 12/02/2012 2009   GFRAA >90 12/02/2012 2009    Lipid Panel     Component Value Date/Time   CHOL 227* 06/17/2014 1507   TRIG 303.0* 06/17/2014 1507   HDL 36.90* 06/17/2014 1507    CHOLHDL 6 06/17/2014 1507   VLDL 60.6* 06/17/2014 1507   LDLCALC 130* 06/17/2014 1507    CBC    Component Value Date/Time   WBC 9.0 06/17/2014 1507   RBC 5.45 06/17/2014 1507   HGB 15.5 06/17/2014 1507   HCT 45.8 06/17/2014 1507   PLT 290.0 06/17/2014 1507   MCV 83.9 06/17/2014 1507   MCH 28.5 12/02/2012 2009   MCHC 33.8 06/17/2014 1507   RDW 13.6 06/17/2014 1507   LYMPHSABS 0.7 12/02/2012 2009   MONOABS 0.5 12/02/2012 2009   EOSABS 0.0 12/02/2012 2009   BASOSABS 0.0 12/02/2012 2009    Hgb A1C Lab Results  Component Value Date   HGBA1C 5.9 11/14/2012         Assessment & Plan:

## 2014-10-06 NOTE — Assessment & Plan Note (Signed)
Will repeat lipid profile and CMET today If remains elevated, will start lipitor (he has been on this in the past) Reinforced low fat diet- handout given

## 2014-10-06 NOTE — Assessment & Plan Note (Addendum)
Likely related to low testosterone- receiving supplementation Occasional cialis use

## 2014-11-03 ENCOUNTER — Encounter: Payer: Self-pay | Admitting: Internal Medicine

## 2014-11-03 ENCOUNTER — Other Ambulatory Visit: Payer: Self-pay | Admitting: Internal Medicine

## 2014-11-03 DIAGNOSIS — E349 Endocrine disorder, unspecified: Secondary | ICD-10-CM

## 2014-11-03 MED ORDER — TESTOSTERONE CYPIONATE 200 MG/ML IM SOLN: 200.0000 mg | INTRAMUSCULAR | Status: DC

## 2014-11-04 ENCOUNTER — Ambulatory Visit (INDEPENDENT_AMBULATORY_CARE_PROVIDER_SITE_OTHER): Payer: BC Managed Care – PPO

## 2014-11-04 DIAGNOSIS — E291 Testicular hypofunction: Secondary | ICD-10-CM

## 2014-11-04 DIAGNOSIS — E349 Endocrine disorder, unspecified: Secondary | ICD-10-CM

## 2014-11-04 MED ORDER — TESTOSTERONE CYPIONATE 200 MG/ML IM SOLN
200.0000 mg | Freq: Once | INTRAMUSCULAR | Status: AC
  Administered 2014-11-04: 200 mg via INTRAMUSCULAR

## 2014-11-07 ENCOUNTER — Emergency Department: Payer: Self-pay | Admitting: Emergency Medicine

## 2014-11-07 ENCOUNTER — Telehealth: Payer: Self-pay

## 2014-11-07 NOTE — Telephone Encounter (Signed)
Received a call from pt's wife stating that pt's bp was 153/95 today,  Per wife pt's blood pressure has been up all morning and pt has a bad headache.  Pt is one citalopram and has taken 2 pills today.  Called and spoke with pt and pt states he is dizzy with a severe headache and it feels like his head is going to explode.  Pt has been taking Tylenol as well.  Pt states he took it about 1.5 hours ago and has not seen any improvement.  Advised pt that Dr. Tamala Julian would like him to go to the ED to be evaluated.  Pt verbalized understanding.

## 2014-11-10 ENCOUNTER — Encounter: Payer: Self-pay | Admitting: Internal Medicine

## 2014-11-10 ENCOUNTER — Ambulatory Visit (INDEPENDENT_AMBULATORY_CARE_PROVIDER_SITE_OTHER): Payer: BC Managed Care – PPO | Admitting: Internal Medicine

## 2014-11-10 VITALS — BP 130/84 | HR 70 | Temp 98.2°F | Wt 266.0 lb

## 2014-11-10 DIAGNOSIS — I10 Essential (primary) hypertension: Secondary | ICD-10-CM

## 2014-11-10 DIAGNOSIS — G44209 Tension-type headache, unspecified, not intractable: Secondary | ICD-10-CM

## 2014-11-10 MED ORDER — KETOROLAC TROMETHAMINE 60 MG/2ML IM SOLN
60.0000 mg | Freq: Once | INTRAMUSCULAR | Status: AC
  Administered 2014-11-10: 60 mg via INTRAMUSCULAR

## 2014-11-10 MED ORDER — TRAMADOL HCL 50 MG PO TABS: 50.0000 mg | ORAL_TABLET | Freq: Three times a day (TID) | ORAL | Status: DC | PRN

## 2014-11-10 NOTE — Patient Instructions (Addendum)
Hypertension Hypertension, commonly called high blood pressure, is when the force of blood pumping through your arteries is too strong. Your arteries are the blood vessels that carry blood from your heart throughout your body. A blood pressure reading consists of a higher number over a lower number, such as 110/72. The higher number (systolic) is the pressure inside your arteries when your heart pumps. The lower number (diastolic) is the pressure inside your arteries when your heart relaxes. Ideally you want your blood pressure below 120/80. Hypertension forces your heart to work harder to pump blood. Your arteries may become narrow or stiff. Having hypertension puts you at risk for heart disease, stroke, and other problems.  RISK FACTORS Some risk factors for high blood pressure are controllable. Others are not.  Risk factors you cannot control include:   Race. You may be at higher risk if you are African American.  Age. Risk increases with age.  Gender. Men are at higher risk than women before age 45 years. After age 65, women are at higher risk than men. Risk factors you can control include:  Not getting enough exercise or physical activity.  Being overweight.  Getting too much fat, sugar, calories, or salt in your diet.  Drinking too much alcohol. SIGNS AND SYMPTOMS Hypertension does not usually cause signs or symptoms. Extremely high blood pressure (hypertensive crisis) may cause headache, anxiety, shortness of breath, and nosebleed. DIAGNOSIS  To check if you have hypertension, your health care provider will measure your blood pressure while you are seated, with your arm held at the level of your heart. It should be measured at least twice using the same arm. Certain conditions can cause a difference in blood pressure between your right and left arms. A blood pressure reading that is higher than normal on one occasion does not mean that you need treatment. If one blood pressure reading  is high, ask your health care provider about having it checked again. TREATMENT  Treating high blood pressure includes making lifestyle changes and possibly taking medicine. Living a healthy lifestyle can help lower high blood pressure. You may need to change some of your habits. Lifestyle changes may include:  Following the DASH diet. This diet is high in fruits, vegetables, and whole grains. It is low in salt, red meat, and added sugars.  Getting at least 2 hours of brisk physical activity every week.  Losing weight if necessary.  Not smoking.  Limiting alcoholic beverages.  Learning ways to reduce stress. If lifestyle changes are not enough to get your blood pressure under control, your health care provider may prescribe medicine. You may need to take more than one. Work closely with your health care provider to understand the risks and benefits. HOME CARE INSTRUCTIONS  Have your blood pressure rechecked as directed by your health care provider.   Take medicines only as directed by your health care provider. Follow the directions carefully. Blood pressure medicines must be taken as prescribed. The medicine does not work as well when you skip doses. Skipping doses also puts you at risk for problems.   Do not smoke.   Monitor your blood pressure at home as directed by your health care provider. SEEK MEDICAL CARE IF:   You think you are having a reaction to medicines taken.  You have recurrent headaches or feel dizzy.  You have swelling in your ankles.  You have trouble with your vision. SEEK IMMEDIATE MEDICAL CARE IF:  You develop a severe headache or confusion.    You have unusual weakness, numbness, or feel faint.  You have severe chest or abdominal pain.  You vomit repeatedly.  You have trouble breathing. MAKE SURE YOU:   Understand these instructions.  Will watch your condition.  Will get help right away if you are not doing well or get worse. Document  Released: 11/27/2005 Document Revised: 04/13/2014 Document Reviewed: 09/19/2013 ExitCare Patient Information 2015 [employer]. This information is not intended to replace advice given to you by your health care provider. Make sure you discuss any questions you have with your health care provider. Tension Headache A tension headache is a feeling of pain, pressure, or aching often felt over the front and sides of the head. The pain can be dull or can feel tight (constricting). It is the most common type of headache. Tension headaches are not normally associated with nausea or vomiting and do not get worse with physical activity. Tension headaches can last 30 minutes to several days.  CAUSES  The exact cause is not known, but it may be caused by chemicals and hormones in the brain that lead to pain. Tension headaches often begin after stress, anxiety, or depression. Other triggers may include:  Alcohol.  Caffeine (too much or withdrawal).  Respiratory infections (colds, flu, sinus infections).  Dental problems or teeth clenching.  Fatigue.  Holding your head and neck in one position too long while using a computer. SYMPTOMS   Pressure around the head.   Dull, aching head pain.   Pain felt over the front and sides of the head.   Tenderness in the muscles of the head, neck, and shoulders. DIAGNOSIS  A tension headache is often diagnosed based on:   Symptoms.   Physical examination.   A CT scan or MRI of your head. These tests may be ordered if symptoms are severe or unusual. TREATMENT  Medicines may be given to help relieve symptoms.  HOME CARE INSTRUCTIONS   Only take over-the-counter or prescription medicines for pain or discomfort as directed by your caregiver.   Lie down in a dark, quiet room when you have a headache.   Keep a journal to find out what may be triggering your headaches. For example, write down:  What you eat and drink.  How much sleep you  get.  Any change to your diet or medicines.  Try massage or other relaxation techniques.   Ice packs or heat applied to the head and neck can be used. Use these 3 to 4 times per day for 15 to 20 minutes each time, or as needed.   Limit stress.   Sit up straight, and do not tense your muscles.   Quit smoking if you smoke.  Limit alcohol use.  Decrease the amount of caffeine you drink, or stop drinking caffeine.  Eat and exercise regularly.  Get 7 to 9 hours of sleep, or as recommended by your caregiver.  Avoid excessive use of pain medicine as recurrent headaches can occur.  SEEK MEDICAL CARE IF:   You have problems with the medicines you were prescribed.  Your medicines do not work.  You have a change from the usual headache.  You have nausea or vomiting. SEEK IMMEDIATE MEDICAL CARE IF:   Your headache becomes severe.  You have a fever.  You have a stiff neck.  You have loss of vision.  You have muscular weakness or loss of muscle control.  You lose your balance or have trouble walking.  You feel faint or pass   out.  You have severe symptoms that are different from your first symptoms. MAKE SURE YOU:   Understand these instructions.  Will watch your condition.  Will get help right away if you are not doing well or get worse. Document Released: 11/27/2005 Document Revised: 02/19/2012 Document Reviewed: 11/17/2011 ExitCare Patient Information 2015 [employer]. This information is not intended to replace advice given to you by your health care provider. Make sure you discuss any questions you have with your health care provider.  

## 2014-11-10 NOTE — Progress Notes (Signed)
Subjective:    Patient ID: Kenneth Sherman, male    DOB: 05-17-1971, 43 y.o.   MRN: [id number]  HPI  Pt presents to the clinic today follow up [facility] ER. He went to the ER 11/07/14 with c/o headache. It has started suddenly but was very severe. At there ER his BP was 158/98. He was diagnosed with a tension headache, although they felt it may also be a result of his BP. He was given Toradol, Benadryl and Reglan in the ER with good relief. He was advised to follow up with his PCP. Since being discharged, he reports he still has a mild headache. It is located on the top of his head. He describes it as a tight/ squeezing sensation. He rates it as a 3/10. He has tried Advil without much relief. He denies blurred vision, flickering lights or sensitivity to light and sound. He has had one previous documented BP in the last year, 144/90. He does have a history of HTN but has not been on any blood pressure medication. BP today is 130/84. His last eye exam was > 1 year ago.  Review of Systems      Past Medical History  Diagnosis Date  . Right knee DJD   . Hypertension     no meds    Current Outpatient Prescriptions  Medication Sig Dispense Refill  . atorvastatin (LIPITOR) 10 MG tablet Take 1 tablet (10 mg total) by mouth daily. 30 tablet 2  . citalopram (CELEXA) 20 MG tablet Take 1 tablet (20 mg total) by mouth daily. 30 tablet 5  . tadalafil (CIALIS) 20 MG tablet Take 0.5-1 tablets (10-20 mg total) by mouth every other day as needed for erectile dysfunction. 10 tablet 0  . testosterone cypionate (DEPO-TESTOSTERONE) 200 MG/ML injection Inject 1 mL (200 mg total) into the muscle every 28 (twenty-eight) days. 10 mL 0   No current facility-administered medications for this visit.   Facility-Administered Medications Ordered in Other Visits  Medication Dose Route Frequency Provider Last Rate Last Dose  . chlorhexidine (HIBICLENS) 4 % liquid 4 application  60 mL Topical Once Kirstin J Shepperson, PA-C         No Known Allergies  Family History  Problem Relation Age of Onset  . Heart attack Mother   . Hypertension Mother   . Hypertension Father   . Hypertension Brother   . Heart attack Father   . Heart attack Father     History   Social History  . Marital Status: Married    Spouse Name: N/A    Number of Children: N/A  . Years of Education: 14   Occupational History  . CAD Designer    Social History Main Topics  . Smoking status: Never Smoker   . Smokeless tobacco: Not on file  . Alcohol Use: 1.2 oz/week    1 Glasses of wine, 1 Cans of beer per week     Comment: occasionally  . Drug Use: No  . Sexual Activity: Yes    Birth Control/ Protection: Condom   Other Topics Concern  . Not on file   Social History Narrative   Regular exercise-no   Caffeine Use-yes     Constitutional: Pt reports headache. Denies fever, malaise, fatigue, or abrupt weight changes.  HEENT: Denies eye pain, eye redness, ear pain, ringing in the ears, wax buildup, runny nose, nasal congestion, bloody nose, or sore throat. Respiratory: Denies difficulty breathing, shortness of breath, cough or sputum production.   Cardiovascular:  Denies chest pain, chest tightness, palpitations or swelling in the hands or feet.  Neurological: Denies dizziness, difficulty with memory, difficulty with speech or problems with balance and coordination.   No other specific complaints in a complete review of systems (except as listed in HPI above).  Objective:   Physical Exam   BP 130/84 mmHg  Pulse 70  Temp(Src) 98.2 F (36.8 C) (Oral)  Wt 266 lb (120.657 kg)  SpO2 98% Wt Readings from Last 3 Encounters:  11/10/14 266 lb (120.657 kg)  10/05/14 265 lb (120.203 kg)  06/17/14 261 lb (118.389 kg)    General: Appears his stated age, well developed, well nourished in NAD.  HEENT: Head: normal shape and size; Eyes: sclera white, no icterus, conjunctiva pink, PERRLA and EOMs intact; Ears: Tm's gray and intact,  normal light reflex;  Cardiovascular: Normal rate and rhythm. S1,S2 noted.  No murmur, rubs or gallops noted.  Pulmonary/Chest: Normal effort and positive vesicular breath sounds. No respiratory distress. No wheezes, rales or ronchi noted.  ANeurological: Alert and oriented.   BMET    Component Value Date/Time   NA 136 06/17/2014 1507   K 4.1 06/17/2014 1507   CL 100 06/17/2014 1507   CO2 27 06/17/2014 1507   GLUCOSE 101* 06/17/2014 1507   BUN 10 06/17/2014 1507   CREATININE 1.1 06/17/2014 1507   CALCIUM 9.0 06/17/2014 1507   GFRNONAA >90 12/02/2012 2009   GFRAA >90 12/02/2012 2009    Lipid Panel     Component Value Date/Time   CHOL 210* 10/05/2014 1642   TRIG 405.0* 10/05/2014 1642   HDL 35.80* 10/05/2014 1642   CHOLHDL 6 10/05/2014 1642   VLDL 81.0* 10/05/2014 1642   LDLCALC 130* 06/17/2014 1507    CBC    Component Value Date/Time   WBC 9.0 06/17/2014 1507   RBC 5.45 06/17/2014 1507   HGB 15.5 06/17/2014 1507   HCT 45.8 06/17/2014 1507   PLT 290.0 06/17/2014 1507   MCV 83.9 06/17/2014 1507   MCH 28.5 12/02/2012 2009   MCHC 33.8 06/17/2014 1507   RDW 13.6 06/17/2014 1507   LYMPHSABS 0.7 12/02/2012 2009   MONOABS 0.5 12/02/2012 2009   EOSABS 0.0 12/02/2012 2009   BASOSABS 0.0 12/02/2012 2009    Hgb A1C Lab Results  Component Value Date   HGBA1C 5.9 11/14/2012        Assessment & Plan:  Hospital follow up for headache:  Hospital notes reviewed No labs or imaging to review Will give 60 mg Toradol IM today RX for Tramadol TID prn for tension headache If no improvement, make appt to have vision checked If vision fine, will consider CT scan of head but no emergent need to do that now.  I do not think his blood pressure is an issue- no need to start medication today but will continue to follow  RTC as needed or if symptoms persist or worsen

## 2014-11-10 NOTE — Addendum Note (Signed)
Addended by: Lurlean Nanny on: 11/10/2014 04:32 PM   Modules accepted: Orders

## 2014-11-10 NOTE — Progress Notes (Signed)
Pre visit review using our clinic review tool, if applicable. No additional management support is needed unless otherwise documented below in the visit note. 

## 2014-11-12 ENCOUNTER — Telehealth: Payer: Self-pay | Admitting: Internal Medicine

## 2014-11-12 NOTE — Telephone Encounter (Signed)
emmi emailed °

## 2014-11-17 ENCOUNTER — Encounter: Payer: Self-pay | Admitting: Internal Medicine

## 2014-11-17 ENCOUNTER — Other Ambulatory Visit: Payer: Self-pay | Admitting: Internal Medicine

## 2014-11-17 DIAGNOSIS — G44229 Chronic tension-type headache, not intractable: Secondary | ICD-10-CM

## 2014-11-19 ENCOUNTER — Ambulatory Visit (INDEPENDENT_AMBULATORY_CARE_PROVIDER_SITE_OTHER)
Admission: RE | Admit: 2014-11-19 | Discharge: 2014-11-19 | Disposition: A | Discharge status: Home / Self Care | Payer: BC Managed Care – PPO | Source: Ambulatory Visit | Attending: Internal Medicine | Admitting: Internal Medicine

## 2014-11-19 DIAGNOSIS — G44229 Chronic tension-type headache, not intractable: Secondary | ICD-10-CM

## 2014-11-20 ENCOUNTER — Telehealth: Payer: Self-pay

## 2014-11-20 ENCOUNTER — Other Ambulatory Visit: Payer: Self-pay | Admitting: Internal Medicine

## 2014-11-20 MED ORDER — TOPIRAMATE 25 MG PO TABS: 25.0000 mg | ORAL_TABLET | Freq: Every day | ORAL | Status: DC

## 2014-11-20 NOTE — Telephone Encounter (Signed)
Pt left v/m requesting cb with CT head results.Please advise.

## 2014-11-20 NOTE — Telephone Encounter (Signed)
Pt notified via mychart

## 2014-12-02 ENCOUNTER — Encounter: Payer: Self-pay | Admitting: Internal Medicine

## 2014-12-02 ENCOUNTER — Ambulatory Visit: Payer: BC Managed Care – PPO

## 2014-12-02 ENCOUNTER — Ambulatory Visit (INDEPENDENT_AMBULATORY_CARE_PROVIDER_SITE_OTHER): Payer: BC Managed Care – PPO | Admitting: Internal Medicine

## 2014-12-02 VITALS — BP 128/88 | HR 78 | Temp 98.5°F | Wt 265.0 lb

## 2014-12-02 DIAGNOSIS — R519 Headache, unspecified: Secondary | ICD-10-CM

## 2014-12-02 DIAGNOSIS — R51 Headache: Secondary | ICD-10-CM

## 2014-12-02 DIAGNOSIS — E291 Testicular hypofunction: Secondary | ICD-10-CM

## 2014-12-02 DIAGNOSIS — E349 Endocrine disorder, unspecified: Secondary | ICD-10-CM

## 2014-12-02 MED ORDER — TESTOSTERONE CYPIONATE 200 MG/ML IM SOLN
200.0000 mg | Freq: Once | INTRAMUSCULAR | Status: AC
  Administered 2014-12-02: 200 mg via INTRAMUSCULAR

## 2014-12-02 MED ORDER — BUTALBITAL-APAP-CAFFEINE 50-325-40 MG PO TABS: 1.0000 | ORAL_TABLET | Freq: Four times a day (QID) | ORAL | Status: DC | PRN

## 2014-12-02 NOTE — Addendum Note (Signed)
Addended by: Lurlean Nanny on: 12/02/2014 04:19 PM   Modules accepted: Orders

## 2014-12-02 NOTE — Patient Instructions (Signed)

## 2014-12-02 NOTE — Progress Notes (Signed)
Pre visit review using our clinic review tool, if applicable. No additional management support is needed unless otherwise documented below in the visit note. 

## 2014-12-02 NOTE — Progress Notes (Signed)
Subjective:    Patient ID: Kenneth Sherman, male    DOB: [birth year], 43 y.o.   MRN: [id number]  HPI  Pt presents to the clinic today to follow up headaches. His headaches started 10/2014. He was seen at [facility] ER for the same. He has good relief with the Migraine cocktail but symptoms returned soon after. He was seen 11/10/14 for the same, given another Toradol injection and RX for Tramadol. He did not have much relief with that treatment. Because of the continued headaches, we obtained a CT scan of his head 11/19/14 which was normal. We did try to start him on Topomax to see if his headaches improved. Since that time, he reports the topomax left a bad taste in his mouth. He reports the headaches are moving all over his head. He reports they still feel like a tight, squeezing sensation. He denies blurred vision, flickering lights, dizziness or nausea.  Review of Systems      Past Medical History  Diagnosis Date  . Right knee DJD   . Hypertension     no meds    Current Outpatient Prescriptions  Medication Sig Dispense Refill  . atorvastatin (LIPITOR) 10 MG tablet Take 1 tablet (10 mg total) by mouth daily. 30 tablet 2  . citalopram (CELEXA) 20 MG tablet Take 1 tablet (20 mg total) by mouth daily. 30 tablet 5  . tadalafil (CIALIS) 20 MG tablet Take 0.5-1 tablets (10-20 mg total) by mouth every other day as needed for erectile dysfunction. 10 tablet 0  . testosterone cypionate (DEPO-TESTOSTERONE) 200 MG/ML injection Inject 1 mL (200 mg total) into the muscle every 28 (twenty-eight) days. 10 mL 0  . topiramate (TOPAMAX) 25 MG tablet Take 1 tablet (25 mg total) by mouth daily. 30 tablet 1  . traMADol (ULTRAM) 50 MG tablet Take 1 tablet (50 mg total) by mouth every 8 (eight) hours as needed. 30 tablet 0   No current facility-administered medications for this visit.   Facility-Administered Medications Ordered in Other Visits  Medication Dose Route Frequency Provider Last Rate Last Dose  .  chlorhexidine (HIBICLENS) 4 % liquid 4 application  60 mL Topical Once Kirstin J Shepperson, PA-C        No Known Allergies  Family History  Problem Relation Age of Onset  . Heart attack Mother   . Hypertension Mother   . Hypertension Father   . Hypertension Brother   . Heart attack Father   . Heart attack Father     History   Social History  . Marital Status: Married    Spouse Name: N/A    Number of Children: N/A  . Years of Education: 14   Occupational History  . CAD Designer    Social History Main Topics  . Smoking status: Never Smoker   . Smokeless tobacco: Not on file  . Alcohol Use: 1.2 oz/week    1 Glasses of wine, 1 Cans of beer per week     Comment: occasionally  . Drug Use: No  . Sexual Activity: Yes    Birth Control/ Protection: Condom   Other Topics Concern  . Not on file   Social History Narrative   Regular exercise-no   Caffeine Use-yes     Constitutional: Pt reports headaches. Denies fever, malaise, fatigue or abrupt weight changes.  HEENT: Denies eye pain, eye redness, ear pain, ringing in the ears, wax buildup, runny nose, nasal congestion, bloody nose, or sore throat. Respiratory: Denies difficulty breathing, shortness of  breath, cough or sputum production.   Cardiovascular: Denies chest pain, chest tightness, palpitations or swelling in the hands or feet.  Neurological: Denies dizziness, difficulty with memory, difficulty with speech or problems with balance and coordination.   No other specific complaints in a complete review of systems (except as listed in HPI above).  Objective:   Physical Exam  BP 128/88 mmHg  Pulse 78  Temp(Src) 98.5 F (36.9 C) (Oral)  Wt 265 lb (120.203 kg)  SpO2 98% Wt Readings from Last 3 Encounters:  12/02/14 265 lb (120.203 kg)  11/10/14 266 lb (120.657 kg)  10/05/14 265 lb (120.203 kg)    General: Appears his stated age, well developed, well nourished in NAD. HEENT: Head: normal shape and size; Eyes:  sclera white, no icterus, conjunctiva pink, PERRLA and EOMs intact;   Cardiovascular: Normal rate and rhythm. S1,S2 noted.  No murmur, rubs or gallops noted. Pulmonary/Chest: Normal effort and positive vesicular breath sounds. No respiratory distress. No wheezes, rales or ronchi noted.  Neurological: Alert and oriented. Cranial nerves II-XII grossly intact. Coordination normal.    BMET    Component Value Date/Time   NA 136 06/17/2014 1507   K 4.1 06/17/2014 1507   CL 100 06/17/2014 1507   CO2 27 06/17/2014 1507   GLUCOSE 101* 06/17/2014 1507   BUN 10 06/17/2014 1507   CREATININE 1.1 06/17/2014 1507   CALCIUM 9.0 06/17/2014 1507   GFRNONAA >90 12/02/2012 2009   GFRAA >90 12/02/2012 2009    Lipid Panel     Component Value Date/Time   CHOL 210* 10/05/2014 1642   TRIG 405.0* 10/05/2014 1642   HDL 35.80* 10/05/2014 1642   CHOLHDL 6 10/05/2014 1642   VLDL 81.0* 10/05/2014 1642   LDLCALC 130* 06/17/2014 1507    CBC    Component Value Date/Time   WBC 9.0 06/17/2014 1507   RBC 5.45 06/17/2014 1507   HGB 15.5 06/17/2014 1507   HCT 45.8 06/17/2014 1507   PLT 290.0 06/17/2014 1507   MCV 83.9 06/17/2014 1507   MCH 28.5 12/02/2012 2009   MCHC 33.8 06/17/2014 1507   RDW 13.6 06/17/2014 1507   LYMPHSABS 0.7 12/02/2012 2009   MONOABS 0.5 12/02/2012 2009   EOSABS 0.0 12/02/2012 2009   BASOSABS 0.0 12/02/2012 2009    Hgb A1C Lab Results  Component Value Date   HGBA1C 5.9 11/14/2012         Assessment & Plan:   Frequent Headaches:  He has failed tramadol and topomax Will give RX for fioricet CT head normal Will refer to neurology for further evaluation and treatment  RTC as needed or if symptoms persist or worsen

## 2014-12-21 DIAGNOSIS — R519 Headache, unspecified: Secondary | ICD-10-CM | POA: Insufficient documentation

## 2014-12-30 ENCOUNTER — Ambulatory Visit: Payer: BC Managed Care – PPO

## 2014-12-30 ENCOUNTER — Telehealth: Payer: Self-pay | Admitting: Internal Medicine

## 2014-12-30 DIAGNOSIS — E349 Endocrine disorder, unspecified: Secondary | ICD-10-CM

## 2014-12-30 MED ORDER — TESTOSTERONE CYPIONATE 200 MG/ML IM SOLN: 200.0000 mg | INTRAMUSCULAR | Status: DC

## 2014-12-30 NOTE — Telephone Encounter (Signed)
Please fax testosterone RX to [facility]

## 2014-12-30 NOTE — Telephone Encounter (Signed)
Rx faxed to pharmacy  

## 2014-12-31 ENCOUNTER — Ambulatory Visit (INDEPENDENT_AMBULATORY_CARE_PROVIDER_SITE_OTHER): Payer: BLUE CROSS/BLUE SHIELD | Admitting: *Deleted

## 2014-12-31 DIAGNOSIS — E349 Endocrine disorder, unspecified: Secondary | ICD-10-CM

## 2014-12-31 DIAGNOSIS — E291 Testicular hypofunction: Secondary | ICD-10-CM

## 2014-12-31 MED ORDER — TESTOSTERONE CYPIONATE 100 MG/ML IM SOLN
200.0000 mg | INTRAMUSCULAR | Status: DC
  Administered 2014-12-31: 200 mg via INTRAMUSCULAR

## 2015-01-04 ENCOUNTER — Other Ambulatory Visit: Payer: Self-pay | Admitting: Internal Medicine

## 2015-01-16 ENCOUNTER — Other Ambulatory Visit: Payer: Self-pay | Admitting: Internal Medicine

## 2015-01-18 NOTE — Telephone Encounter (Signed)
Last time I saw him, the Topomax left a bad taste in his mouth and he didn't like taking it. He has seen neurology since that time? What did they recommend?

## 2015-01-18 NOTE — Telephone Encounter (Signed)
Last filled 11/20/14 with 1 refill--please advise

## 2015-01-18 NOTE — Telephone Encounter (Signed)
I reviewed the neurology note under the media tab. The topomax was stopped and nortriptyline was started. Refill declined.

## 2015-01-19 NOTE — Telephone Encounter (Signed)
Left detailed msg on VM per HIPAA  

## 2015-01-28 ENCOUNTER — Ambulatory Visit: Payer: BLUE CROSS/BLUE SHIELD | Admitting: Internal Medicine

## 2015-01-28 ENCOUNTER — Ambulatory Visit (INDEPENDENT_AMBULATORY_CARE_PROVIDER_SITE_OTHER): Payer: BLUE CROSS/BLUE SHIELD

## 2015-01-28 DIAGNOSIS — E291 Testicular hypofunction: Secondary | ICD-10-CM

## 2015-01-28 DIAGNOSIS — E349 Endocrine disorder, unspecified: Secondary | ICD-10-CM

## 2015-01-29 MED ORDER — TESTOSTERONE CYPIONATE 200 MG/ML IM SOLN
200.0000 mg | Freq: Once | INTRAMUSCULAR | Status: AC
  Administered 2015-01-29: 200 mg via INTRAMUSCULAR

## 2015-02-08 ENCOUNTER — Other Ambulatory Visit: Payer: Self-pay | Admitting: Internal Medicine

## 2015-02-17 ENCOUNTER — Other Ambulatory Visit: Payer: Self-pay

## 2015-02-17 DIAGNOSIS — F411 Generalized anxiety disorder: Secondary | ICD-10-CM

## 2015-02-17 MED ORDER — CITALOPRAM HYDROBROMIDE 20 MG PO TABS: 20.0000 mg | ORAL_TABLET | Freq: Every day | ORAL | Status: DC

## 2015-02-17 NOTE — Telephone Encounter (Signed)
Last filled 05/2014 with 5 refills--please advise--no upcoming appts

## 2015-02-25 ENCOUNTER — Ambulatory Visit (INDEPENDENT_AMBULATORY_CARE_PROVIDER_SITE_OTHER): Payer: BLUE CROSS/BLUE SHIELD | Admitting: *Deleted

## 2015-02-25 DIAGNOSIS — E291 Testicular hypofunction: Secondary | ICD-10-CM

## 2015-02-25 DIAGNOSIS — E349 Endocrine disorder, unspecified: Secondary | ICD-10-CM

## 2015-02-25 MED ORDER — TESTOSTERONE CYPIONATE 200 MG/ML IM SOLN
200.0000 mg | INTRAMUSCULAR | Status: DC
  Administered 2015-02-25: 200 mg via INTRAMUSCULAR

## 2015-03-07 ENCOUNTER — Other Ambulatory Visit: Payer: Self-pay | Admitting: Internal Medicine

## 2015-03-25 ENCOUNTER — Ambulatory Visit (INDEPENDENT_AMBULATORY_CARE_PROVIDER_SITE_OTHER): Payer: BLUE CROSS/BLUE SHIELD | Admitting: *Deleted

## 2015-03-25 DIAGNOSIS — E291 Testicular hypofunction: Secondary | ICD-10-CM | POA: Diagnosis not present

## 2015-03-25 MED ORDER — TESTOSTERONE CYPIONATE 200 MG/ML IM SOLN
200.0000 mg | INTRAMUSCULAR | Status: DC
  Administered 2015-03-25: 200 mg via INTRAMUSCULAR

## 2015-04-22 ENCOUNTER — Other Ambulatory Visit: Payer: Self-pay | Admitting: Internal Medicine

## 2015-04-22 ENCOUNTER — Telehealth: Payer: Self-pay | Admitting: Internal Medicine

## 2015-04-22 ENCOUNTER — Ambulatory Visit: Payer: BLUE CROSS/BLUE SHIELD

## 2015-04-22 DIAGNOSIS — N529 Male erectile dysfunction, unspecified: Secondary | ICD-10-CM

## 2015-04-22 MED ORDER — TADALAFIL 20 MG PO TABS: 10.0000 mg | ORAL_TABLET | ORAL | Status: DC | PRN

## 2015-04-22 NOTE — Telephone Encounter (Signed)
Sent cialis to pharmacy

## 2015-04-23 ENCOUNTER — Ambulatory Visit: Payer: BLUE CROSS/BLUE SHIELD | Admitting: Internal Medicine

## 2015-04-23 ENCOUNTER — Ambulatory Visit (INDEPENDENT_AMBULATORY_CARE_PROVIDER_SITE_OTHER): Payer: BLUE CROSS/BLUE SHIELD | Admitting: Internal Medicine

## 2015-04-23 ENCOUNTER — Encounter: Payer: Self-pay | Admitting: Internal Medicine

## 2015-04-23 VITALS — BP 128/80 | HR 94 | Temp 98.5°F | Wt 266.8 lb

## 2015-04-23 DIAGNOSIS — S46112A Strain of muscle, fascia and tendon of long head of biceps, left arm, initial encounter: Secondary | ICD-10-CM | POA: Diagnosis not present

## 2015-04-23 DIAGNOSIS — E349 Endocrine disorder, unspecified: Secondary | ICD-10-CM

## 2015-04-23 DIAGNOSIS — E291 Testicular hypofunction: Secondary | ICD-10-CM | POA: Diagnosis not present

## 2015-04-23 DIAGNOSIS — S46212A Strain of muscle, fascia and tendon of other parts of biceps, left arm, initial encounter: Secondary | ICD-10-CM

## 2015-04-23 MED ORDER — TESTOSTERONE CYPIONATE 100 MG/ML IM SOLN
200.0000 mg | Freq: Once | INTRAMUSCULAR | Status: AC
  Administered 2015-04-23: 200 mg via INTRAMUSCULAR

## 2015-04-23 NOTE — Progress Notes (Signed)
Subjective:    Patient ID: Kenneth Sherman, male    DOB: March 09, 1971, 44 y.o.   MRN: [id number]  HPI  Pt presents to the clinic today with c/o left arm pain. This started 2 weeks ago while playing softball. He reports a softball bounced off the ground and hit his left bicep. It did bruise for a while. It is mildly painful but he has not had any reduced ROM.  He has continued to play softball over the last few weeks. He has tried ice and Ibuprofen with some relief.  Review of Systems      Past Medical History  Diagnosis Date  . Right knee DJD   . Hypertension     no meds    Current Outpatient Prescriptions  Medication Sig Dispense Refill  . atorvastatin (LIPITOR) 10 MG tablet Take 1 tablet (10 mg total) by mouth daily. 30 tablet 3  . butalbital-acetaminophen-caffeine (FIORICET, ESGIC) 50-325-40 MG per tablet Take 1 tablet by mouth every 6 (six) hours as needed for headache. 14 tablet 0  . citalopram (CELEXA) 20 MG tablet Take 1 tablet (20 mg total) by mouth daily. 30 tablet 5  . tadalafil (CIALIS) 20 MG tablet Take 0.5-1 tablets (10-20 mg total) by mouth every other day as needed for erectile dysfunction. 10 tablet 0  . testosterone cypionate (DEPO-TESTOSTERONE) 200 MG/ML injection Inject 1 mL (200 mg total) into the muscle every 28 (twenty-eight) days. 10 mL 0   No current facility-administered medications for this visit.   Facility-Administered Medications Ordered in Other Visits  Medication Dose Route Frequency Provider Last Rate Last Dose  . chlorhexidine (HIBICLENS) 4 % liquid 4 application  60 mL Topical Once Kirstin Shepperson, PA-C        No Known Allergies  Family History  Problem Relation Age of Onset  . Heart attack Mother   . Hypertension Mother   . Hypertension Father   . Hypertension Brother   . Heart attack Father   . Heart attack Father     History   Social History  . Marital Status: Married    Spouse Name: N/A  . Number of Children: N/A  . Years of  Education: 14   Occupational History  . CAD Designer    Social History Main Topics  . Smoking status: Never Smoker   . Smokeless tobacco: Not on file  . Alcohol Use: 1.2 oz/week    1 Glasses of wine, 1 Cans of beer per week     Comment: occasionally  . Drug Use: No  . Sexual Activity: Yes    Birth Control/ Protection: Condom   Other Topics Concern  . Not on file   Social History Narrative   Regular exercise-no   Caffeine Use-yes     Constitutional: Denies fever, malaise, fatigue, headache or abrupt weight changes.  Musculoskeletal: Pt reports left arm pain. Denies decrease in range of motion, difficulty with gait, or joint pain and swelling.  Skin: Denies redness, rashes, lesions or ulcercations.    No other specific complaints in a complete review of systems (except as listed in HPI above).  Objective:   Physical Exam   BP 128/80 mmHg  Pulse 94  Temp(Src) 98.5 F (36.9 C) (Oral)  Wt 266 lb 12 oz (120.997 kg)  SpO2 96% Wt Readings from Last 3 Encounters:  04/23/15 266 lb 12 oz (120.997 kg)  12/02/14 265 lb (120.203 kg)  11/10/14 266 lb (120.657 kg)    General: Appears his stated age, obese  in NAD. Skin: Warm, dry and intact. No rashes, lesions or ulcerations noted. Cardiovascular: Normal rate and rhythm. S1,S2 noted.  No murmur, rubs or gallops noted.  Pulmonary/Chest: Normal effort and positive vesicular breath sounds. No respiratory distress. No wheezes, rales or ronchi noted.  Musculoskeletal: Normal flexion, extension and rotation of the left elbow. Noted deformity of the biceps tendon medially but not laterally. No pain with palpation. No swelling noted. Strength 5/5 BUE. Hand grips equal.  BMET    Component Value Date/Time   NA 136 06/17/2014 1507   K 4.1 06/17/2014 1507   CL 100 06/17/2014 1507   CO2 27 06/17/2014 1507   GLUCOSE 101* 06/17/2014 1507   BUN 10 06/17/2014 1507   CREATININE 1.1 06/17/2014 1507   CALCIUM 9.0 06/17/2014 1507   GFRNONAA  >90 12/02/2012 2009   GFRAA >90 12/02/2012 2009    Lipid Panel     Component Value Date/Time   CHOL 210* 10/05/2014 1642   TRIG 405.0* 10/05/2014 1642   HDL 35.80* 10/05/2014 1642   CHOLHDL 6 10/05/2014 1642   VLDL 81.0* 10/05/2014 1642   LDLCALC 130* 06/17/2014 1507    CBC    Component Value Date/Time   WBC 9.0 06/17/2014 1507   RBC 5.45 06/17/2014 1507   HGB 15.5 06/17/2014 1507   HCT 45.8 06/17/2014 1507   PLT 290.0 06/17/2014 1507   MCV 83.9 06/17/2014 1507   MCH 28.5 12/02/2012 2009   MCHC 33.8 06/17/2014 1507   RDW 13.6 06/17/2014 1507   LYMPHSABS 0.7 12/02/2012 2009   MONOABS 0.5 12/02/2012 2009   EOSABS 0.0 12/02/2012 2009   BASOSABS 0.0 12/02/2012 2009    Hgb A1C Lab Results  Component Value Date   HGBA1C 5.9 11/14/2012        Assessment & Plan:   Biceps tear:  Advised him to rest his arm over the next few weeks He will get a Neoprene sleeve to wear He will continue ice and ibuprofen Return precautions given, rehab instructions given  RTC as needed or if symptoms persist or worsen

## 2015-04-23 NOTE — Addendum Note (Signed)
Addended by: Modena Nunnery on: 04/23/2015 02:52 PM   Modules accepted: Orders

## 2015-04-23 NOTE — Progress Notes (Signed)
Pre visit review using our clinic review tool, if applicable. No additional management support is needed unless otherwise documented below in the visit note. 

## 2015-04-23 NOTE — Patient Instructions (Signed)
Biceps Tendon Disruption (Distal) with Rehab The biceps tendon attaches the biceps muscle to the bones of the elbow and the shoulder. A distal biceps tendon disruption is a tear of this tendon at the end the attached near the elbow. A distal biceps tendon rupture is an uncommon injury. These injuries usually involve a complete tear of the tendon from the bone; however, partial tears are also possible. The bicep muscle works with other muscles to bend the elbow and rotate the palm upward (supinate). A complete biceps rupture will result in approximately a 30% decrease in elbow bending strength and a 40% decrease in one's ability to supinate the wrist. SYMPTOMS   Pain, tenderness, swelling, warmth, or redness at the elbow, usually in the front of the elbow.  Pain that worsens with flexion of the elbow against resistance and when straightening the elbow.  Bulge can be seen and felt in the arm.  Bruising (contusion) in the elbow or forearm after 24 hours.  Limited motion of the elbow.  Weakness with attempted elbow bending (lifting or carrying) or rotation of the wrist (like when using a screwdriver).  A crackling sound (crepitation) when the tendon or elbow is moved or touched. CAUSES  A biceps tendon rupture occurs when the tendon is subjected to a force that is greater than it can withstand, such as straightening the elbow while the biceps is contracted or direct trauma (rare). RISK INCREASES WITH:   Sports that involve contact, as well as throwing sports, gymnastics, weightlifting, and bodybuilding.  Heavy labor.  Poor strength and flexibility.  Failure to warm-up properly before activity. PREVENTION  Warm up and stretch appropriately before activity.  Maintain physical fitness:  Strength, flexibility, and endurance.  Cardiovascular fitness  Allow your body to recover between practices and competition.  Learn and use proper technique. PROGNOSIS  Surgery is usually required  to fix distal biceps tendon rupture. After surgery, a recovery period of 4 to 8 months can be expected to allow for healing and a return to sports.  RELATED COMPLICATIONS  Weakness of elbow bending and forearm rotation, especially if treated non-surgically.  Prolonged disability.  Re-rupture of the tendon after surgery.  Risks of surgery, including infection, bleeding, injury to nerves, elbow or wrist stiffness or loss of motion, and weakness of elbow bending or wrist rotation. TREATMENT  Treatment initially consists of ice and medication to help reduce pain and inflammation. A sling may also be worn to increase one's comfort. Surgery is required for a full recovery and return to sports. Surgery involves reattaching the tendon to the bone. Weakness can be expected if surgery is not performed; however, this may be acceptable for sedentary individuals. Surgery is usually followed by immobilization and rehabilitation exercises to regain strength and a full range of motion.  MEDICATION  If pain medication is necessary, nonsteroidal anti-inflammatory medications, such as aspirin and ibuprofen, or other minor pain relievers, such as acetaminophen, are often recommended.  Do not take pain medication for 7 days before surgery.  Prescription pain relievers may be given if deemed necessary by your caregiver. Use only as directed and only as much as you need. HEAT AND COLD  Cold treatment (icing) relieves pain and reduces inflammation. Cold treatment should be applied for 10 to 15 minutes every 2 to 3 hours for inflammation and pain and immediately after any activity that aggravates your symptoms. Use ice packs or an ice massage.  Heat treatment may be used prior to performing the stretching and strengthening  activities prescribed by your caregiver, physical therapist, or athletic trainer. Use a heat pack or a warm soak. SEEK MEDICAL CARE IF:   Symptoms get worse or do not improve in 2 weeks despite  treatment.  You experience pain, numbness, or coldness in the hand.  Blue, gray, or dark color appears in the fingernails.  Any of the following occur after surgery:  Increased pain, swelling, redness, drainage, or bleeding in the surgical area.  Signs of infection (headache, muscle aches, dizziness, or a general ill feeling with fever).  New, unexplained symptoms develop (drugs used in treatment may produce side effects). EXERCISES RANGE OF MOTION (ROM) AND STRETCHING EXERCISES - Biceps Tendon Disruption (Distal) Once your physician, physical therapist or athletic trainer has permitted you to discontinue using your brace or splint, you may begin to restore your elbow motion by using these exercises. Beginning these exercises before your provider's approval may result in delayed healing. While completing these exercises, remember:   Restoring tissue flexibility helps normal motion to return to the joints. This allows healthier, less painful movement and activity.  An effective stretch should be held for at least 30 seconds.  A stretch should never be painful. You should only feel a gentle lengthening or release in the stretched tissue. RANGE OF MOTION - Extension  Hold your right / left arm at your side and straighten your elbow as far as you can using your right / left arm muscles.  Straighten the right / left elbow farther by gently pushing down on your forearm until you feel a gentle stretch on the inside of your elbow. Hold this position for __________ seconds.  Slowly return to the starting position. Repeat __________ times. Complete this exercise __________ times per day.  RANGE OF MOTION - Flexion  Hold your right / left arm at your side and bend your elbow as far as you can using your right / left arm muscles.  Bend the right / left elbow farther by gently pushing up on your forearm until you feel a gentle stretch on the outside of your elbow. Hold this position for  __________ seconds.  Slowly return to the starting position. Repeat __________ times. Complete this exercise __________ times per day.  RANGE OF MOTION - Supination, Active   Stand or sit with your elbows at your side. Bend your right / left elbow to 90 degrees.  Turn your palm upward until you feel a gentle stretch on the inside of your forearm.  Hold this position for __________ seconds. Slowly release and return to the starting position. Repeat __________ times. Complete this stretch __________ times per day.  RANGE OF MOTION - Pronation, Active   Stand or sit with your elbows at your side. Bend your right / left elbow to 90 degrees.  Turn your palm downward until you feel a gentle stretch on the top of your forearm.  Hold this position for __________ seconds. Slowly release and return to the starting position. Repeat __________ times. Complete this stretch __________ times per day.  STRENGTHENING EXERCISES - Biceps Tendon Disruption (Distal) Once your physician, physical therapist, or athletic trainer has permitted you to discontinue using your brace or splint, you may begin restoring your arm strength by using these exercises. Beginning these before your provider's approval may result in delayed healing. While completing these exercises, remember:   Muscles can gain both the endurance and the strength needed for everyday activities through controlled exercises.  Complete these exercises as instructed by your physician,  physical therapist, or athletic trainer. Progress the resistance and repetitions only as guided.  You may experience muscle soreness or fatigue, but the pain or discomfort you are trying to eliminate should never worsen during these exercises. If this pain does worsen, stop and make certain you are following the directions exactly. If the pain is still present after adjustments, discontinue the exercise until you can discuss the trouble with your clinician. STRENGTH -  Elbow Flexors, Isometric   Stand or sit upright on a firm surface. Place your right / left arm so that your hand is palm-up and at the height of your waist.  Place your opposite hand on top of your forearm. Gently push down as your right / left arm resists. Push as hard as you can with both arms without causing any pain or movement at your right / left elbow. Hold this stationary position for __________ seconds.  Gradually release the tension in both arms. Allow your muscles to relax completely before repeating. Repeat __________ times. Complete this exercise __________ times per day. STRENGTH - Elbow Extensors, Isometric   Stand or sit upright on a firm surface. Place your right / left arm so that your palm faces your abdomen and it is at the height of your waist.  Place your opposite hand on the underside of your forearm. Gently push up as your right / left arm resists. Push as hard as you can with both arms without causing any pain or movement at your right / left elbow. Hold this stationary position for __________ seconds.  Gradually release the tension in both arms. Allow your muscles to relax completely before repeating. Repeat __________ times. Complete this exercise __________ times per day. STRENGTH - Elbow Flexors, Supinated  With good posture, stand or sit on a firm chair without armrests. Allow your right / left arm to rest at your side with your palm facing forward.  Holding a __________ weight or gripping a rubber exercise band/tubing, bring your right / left hand toward your shoulder.  Allow your muscles to control the resistance as your hand returns to your side. Repeat __________ times. Complete this exercise __________ times per day.  STRENGTH - Elbow Flexors, Neutral  With good posture, stand or sit on a firm chair without armrests. Allow your right / left arm to rest at your side with your thumb facing forward.  Holding a __________ weight or gripping a rubber exercise  band/tubing, bring your right / left hand toward your shoulder.  Allow your muscles to control the resistance as your hand returns to your side. Repeat __________ times. Complete this exercise __________ times per day.  STRENGTH - Elbow Extensors  Lie on your back. Extend your right / left elbow into the air, pointing it toward the ceiling. Brace your arm with your opposite hand.*  Holding a __________ weight in your hand, slowly straighten your right / left elbow.  Allow your muscles to control the weight as your hand returns to its starting position. Repeat __________ times. Complete this exercise __________ times per day. *You may also stand with your elbow overhead and pointed toward the ceiling and supported by your opposite hand. STRENGTH - Elbow Extensors, Dynamic  With good posture, stand or sit on a firm chair without armrests. Keeping your upper arms at your side, bring both hands up to your right / left shoulder while gripping a rubber exercise band/tubing. Your right / left hand should be just below the other hand.  Straighten your  right / left elbow. Hold for __________ seconds.  Allow your muscles to control the rubber exercise band/tubing as your hand returns to your shoulder. Repeat __________ times. Complete this exercise __________ times per day. STRENGTH - Forearm Supinators   Sit with your right / left forearm supported on a table, keeping your elbow below shoulder height. Rest your hand over the edge, palm down.  Gently grip a hammer or a soup ladle.  Without moving your elbow, slowly turn your palm and hand upward to a "thumbs-up" position.  Hold this position for __________ seconds. Slowly return to the starting position. Repeat __________ times. Complete this exercise __________ times per day.  STRENGTH - Forearm Pronators   Sit with your right / left forearm supported on a table, keeping your elbow below shoulder height. Rest your hand over the edge, palm  up.  Gently grip a hammer or a soup ladle.  Without moving your elbow, slowly turn your palm and hand upward to a "thumbs-up" position.  Hold this position for __________ seconds. Slowly return to the starting position. Repeat __________ times. Complete this exercise __________ times per day.  Document Released: 11/27/2005 Document Revised: 02/19/2012 Document Reviewed: 03/11/2009 [facility] Patient Information 2015 [place], [place]. This information is not intended to replace advice given to you by your health care provider. Make sure you discuss any questions you have with your health care provider.

## 2015-04-27 ENCOUNTER — Encounter: Payer: Self-pay | Admitting: Internal Medicine

## 2015-04-28 ENCOUNTER — Encounter: Payer: Self-pay | Admitting: Family Medicine

## 2015-04-28 ENCOUNTER — Ambulatory Visit (INDEPENDENT_AMBULATORY_CARE_PROVIDER_SITE_OTHER): Payer: BLUE CROSS/BLUE SHIELD | Admitting: Family Medicine

## 2015-04-28 VITALS — BP 120/80 | HR 96 | Temp 98.5°F | Ht 71.0 in | Wt 266.5 lb

## 2015-04-28 DIAGNOSIS — M7150 Other bursitis, not elsewhere classified, unspecified site: Secondary | ICD-10-CM | POA: Diagnosis not present

## 2015-04-28 DIAGNOSIS — S46212A Strain of muscle, fascia and tendon of other parts of biceps, left arm, initial encounter: Secondary | ICD-10-CM | POA: Diagnosis not present

## 2015-04-28 NOTE — Progress Notes (Signed)
Dr. Frederico Hamman T. Aleda Madl, MD, [facility] [place], [address] Phone: [phone] Fax: [phone]  04/28/2015  Patient: Kenneth Sherman, MRN: [id number], DOB: 05/03/1971, 44 y.o.  Primary Physician:  Webb Silversmith, NP  Chief Complaint: Torn Bicep and Elbow Pain  Subjective:   Kenneth Sherman is a 44 y.o. very pleasant male patient who presents with the following:  Consulting provider: Webb Silversmith, NP Reason: B elbow pain  L biceps - distal. The patient actually injured his left bicep approximately 3 weeks ago.  He tells me that he was playing softball and a softball bounce up off the ground and hit his left bicep.  Initially it did hurt, and he developed some bruising.  He was able to continue playing softball, but after he was continuing to play softball he had an increase in pain, and had a significant increase in retraction and now has a distally in his upper extremity.  R elbow: also within the last week he fell and hit his right elbow and has some puffiness around the olecranon.  His range of motion is preserved.  He also has some soft tissue pain on the extensor surface of his forearm.  Past Medical History, Surgical History, Social History, Family History, Problem List, Medications, and Allergies have been reviewed and updated if relevant.  GEN: No fevers, chills. Nontoxic. Primarily MSK c/o today. MSK: Detailed in the HPI GI: tolerating PO intake without difficulty Neuro: No numbness, parasthesias, or tingling associated. Otherwise, the pertinent positives and negatives are listed above and in the HPI, otherwise a full review of systems has been reviewed and is negative unless noted positive.   Objective:   BP 120/80 mmHg  Pulse 96  Temp(Src) 98.5 F (36.9 C) (Oral)  Ht 5\' 11"  (1.803 m)  Wt 266 lb 8 oz (120.884 kg)  BMI 37.19 kg/m2   GEN: WDWN, NAD, Non-toxic, Alert & Oriented x 3 HEENT: Atraumatic,  Normocephalic.  Ears and Nose: No external deformity. CV: RRR, no m/g/r  PULM: Normal respiratory rate, no accessory muscle use. No wheezes, crackles or rhonchi  EXTR: No clubbing/cyanosis/edema NEURO: Normal gait.  PSYCH: Normally interactive. Conversant. Not depressed or anxious appearing.  Calm demeanor.   Shoulder range of motion is preserved Cervical spine ROM is normal.   Right elbow has a mildpuffiness in the olecranon bursa and there is some soft tissuetenderness adjacent to this and distal to this.  His motion is preserved on the right.  Left, elbow,there is extensive bruising distally in the upper extremity, and the patient has a notable Distally.  He is able to ride some flexion resistance, but it is dramatically decreased compared to the contralateral side.  Sensation is preserved.  Radiology: Diagnostic Ultrasound Evaluation Terason [id number], MSK ultrasound, MSK probe Anatomy scanned: Left upper arm Indication: Pain Findings: approximately, the patient appears to have intact muscle fiber medially and laterally on both longitudinal and transverse views.  In the distal elbow, there is significant hypoechoic space, more likely consistent with blood and hematoma surrounding muscle fiber and tendon.  There does appear to be retracted tendon and muscle fiber as one goes along the anterior upper extremity more proximally. The muscle fiber appears to have hypoechogenicity or anechoic portions through it distally, clinically this would be consistent with torn muscle fiber. At the farthest lateral point, there appears to be muscle fiber distally, as well.  Electronically Signed  By: Owens Loffler, MD On: 04/29/2015 9:21 AM  Assessment and Plan:   Rupture of distal biceps tendon, left, initial encounter - Plan: MR Elbow Left Wo Contrast, Ambulatory referral to Orthopedic Surgery  Traumatic bursitis  Clinically, concerning for distal biceps rupture. Ultrasound seems to confirm this.   There are some intact lateral fibers which may represent brachialis muscle fiber or possibly incomplete rupture.  Obtain an MRI of the elbow on the left without contrast to evaluate for integrity of distal biceps tendon insertion.  I suspect that this patient worsened his injury by continuing playing softball.  He is already at least 3 weeks out from this injury.  If he needs surgery, then time is of the essence.  I am arranging follow-up with his orthopedic surgeon after his MRI is complete.  I appreciate his assistance.  The R elbow will heal in short order.  I appreciate the opportunity to evaluate this very friendly patient. If you have any question regarding her care or prognosis, do not hesitate to ask.   Follow-up: consult Dr. Noemi Chapel  New Prescriptions   No medications on file   Orders Placed This Encounter  Procedures  . MR Elbow Left Wo Contrast  . Ambulatory referral to Orthopedic Surgery    Signed,  Koltan Portocarrero T. Harun Brumley, MD   Patient's Medications  New Prescriptions   No medications on file  Previous Medications   ATORVASTATIN (LIPITOR) 10 MG TABLET    Take 1 tablet (10 mg total) by mouth daily.   CITALOPRAM (CELEXA) 20 MG TABLET    Take 1 tablet (20 mg total) by mouth daily.   NORTRIPTYLINE (PAMELOR) 10 MG CAPSULE    Take 20 mg by mouth at bedtime.    TADALAFIL (CIALIS) 20 MG TABLET    Take 0.5-1 tablets (10-20 mg total) by mouth every other day as needed for erectile dysfunction.   TESTOSTERONE CYPIONATE (DEPO-TESTOSTERONE) 200 MG/ML INJECTION    Inject 1 mL (200 mg total) into the muscle every 28 (twenty-eight) days.  Modified Medications   No medications on file  Discontinued Medications   No medications on file

## 2015-04-28 NOTE — Patient Instructions (Signed)

## 2015-05-05 ENCOUNTER — Inpatient Hospital Stay: Admission: RE | Admit: 2015-05-05 | Payer: BLUE CROSS/BLUE SHIELD | Source: Ambulatory Visit

## 2015-05-12 HISTORY — PX: BICEPS TENDON REPAIR: SHX566

## 2015-05-16 ENCOUNTER — Other Ambulatory Visit: Payer: BLUE CROSS/BLUE SHIELD

## 2015-05-21 ENCOUNTER — Ambulatory Visit: Payer: BLUE CROSS/BLUE SHIELD

## 2015-05-27 ENCOUNTER — Ambulatory Visit: Payer: BLUE CROSS/BLUE SHIELD

## 2015-05-28 ENCOUNTER — Ambulatory Visit: Payer: BLUE CROSS/BLUE SHIELD

## 2015-06-30 ENCOUNTER — Ambulatory Visit (INDEPENDENT_AMBULATORY_CARE_PROVIDER_SITE_OTHER): Payer: BLUE CROSS/BLUE SHIELD | Admitting: Internal Medicine

## 2015-06-30 ENCOUNTER — Encounter: Payer: Self-pay | Admitting: Internal Medicine

## 2015-06-30 VITALS — BP 138/94 | HR 84 | Temp 98.4°F | Wt 262.0 lb

## 2015-06-30 DIAGNOSIS — E291 Testicular hypofunction: Secondary | ICD-10-CM | POA: Diagnosis not present

## 2015-06-30 DIAGNOSIS — E349 Endocrine disorder, unspecified: Secondary | ICD-10-CM

## 2015-06-30 MED ORDER — TESTOSTERONE CYPIONATE 200 MG/ML IM SOLN
200.0000 mg | Freq: Once | INTRAMUSCULAR | Status: AC
  Administered 2015-06-30: 200 mg via INTRAMUSCULAR

## 2015-06-30 NOTE — Patient Instructions (Signed)
Testosterone This test is used to determine if your testosterone level is abnormal. This could be used to explain difficulty getting an erection (erectile dysfunction), inability of your partner to get pregnant (infertility), premature or delayed puberty if you are male, or the appearance of masculine physical features if you are male. PREPARATION FOR TEST A blood sample is obtained by inserting a needle into a vein in the arm. NORMAL FINDINGS  Free Testosterone: 0.3-2 pg/mL  % Free Testosterone: 0.1%-0.3% Total Testosterone:  7 mos-9 yrs (Tanner Stage I)  Male: Less than 30 ng/dL  Male: Less than 30 ng/dL  10-13 yrs (Tanner Stage II)  Male: Less than 300 ng/dL  Male: Less than 40 ng/dL  14-15 yrs (Tanner Stage III)  Male: 170-540 ng/dL  Male: Less than 60 ng/dL  16-19 yrs (Tanner Stage IV, V)  Male: 250-910 ng/dL  Male: Less than 70 ng/dL  20 yrs and over  Male: 280-1080 ng/dL  Male: Less than 70 ng/dL Ranges for normal findings may vary among different laboratories and hospitals. You should always check with your doctor after having lab work or other tests done to discuss the meaning of your test results and whether your values are considered within normal limits. MEANING OF TEST  Your caregiver will go over the test results with you and discuss the importance and meaning of your results, as well as treatment options and the need for additional tests if necessary. OBTAINING THE TEST RESULTS It is your responsibility to obtain your test results. Ask the lab or department performing the test when and how you will get your results. Document Released: 12/14/2004 Document Revised: 02/19/2012 Document Reviewed: 03/25/2014 Exit[employer] Patient Information 2015 [employer]. This information is not intended to replace advice given to you by your health care provider. Make sure you discuss any questions you have with your health care provider.  

## 2015-06-30 NOTE — Progress Notes (Signed)
Subjective:    Patient ID: Kenneth Sherman, male    DOB: Jan 23, 1971, 44 y.o.   MRN: [id number]  HPI  Pt presents to the clinic today to discuss his testosterone injections. He reports he has been told that he should be getting his injections every 2 weeks instead of monthly. He does feel more fatigued by the time he is due for his injection. He denies chest pain, chest tightness, decreased libido or erectile dysfunction. His last testosterone level was 06/2014 and it was low.  Review of Systems      Past Medical History  Diagnosis Date  . Right knee DJD   . Hypertension     no meds    Current Outpatient Prescriptions  Medication Sig Dispense Refill  . atorvastatin (LIPITOR) 10 MG tablet Take 1 tablet (10 mg total) by mouth daily. 30 tablet 3  . citalopram (CELEXA) 20 MG tablet Take 1 tablet (20 mg total) by mouth daily. 30 tablet 5  . nortriptyline (PAMELOR) 10 MG capsule Take 20 mg by mouth at bedtime.   1  . tadalafil (CIALIS) 20 MG tablet Take 0.5-1 tablets (10-20 mg total) by mouth every other day as needed for erectile dysfunction. 10 tablet 0  . testosterone cypionate (DEPO-TESTOSTERONE) 200 MG/ML injection Inject 1 mL (200 mg total) into the muscle every 28 (twenty-eight) days. 10 mL 0   No current facility-administered medications for this visit.   Facility-Administered Medications Ordered in Other Visits  Medication Dose Route Frequency Provider Last Rate Last Dose  . chlorhexidine (HIBICLENS) 4 % liquid 4 application  60 mL Topical Once Kirstin Shepperson, PA-C        No Known Allergies  Family History  Problem Relation Age of Onset  . Heart attack Mother   . Hypertension Mother   . Hypertension Father   . Hypertension Brother   . Heart attack Father   . Heart attack Father     History   Social History  . Marital Status: Married    Spouse Name: N/A  . Number of Children: N/A  . Years of Education: 14   Occupational History  . CAD Designer    Social  History Main Topics  . Smoking status: Never Smoker   . Smokeless tobacco: Never Used  . Alcohol Use: 1.2 oz/week    1 Glasses of wine, 1 Cans of beer per week     Comment: occasionally  . Drug Use: No  . Sexual Activity: Yes    Birth Control/ Protection: Condom   Other Topics Concern  . Not on file   Social History Narrative   Regular exercise-no   Caffeine Use-yes     Constitutional: Pt reports fatigue. Denies fever, malaise, headache or abrupt weight changes.  Respiratory: Denies difficulty breathing, shortness of breath, cough or sputum production.   Cardiovascular: Denies chest pain, chest tightness, palpitations or swelling in the hands or feet.  Neurological: Denies dizziness, difficulty with memory, difficulty with speech or problems with balance and coordination.   No other specific complaints in a complete review of systems (except as listed in HPI above).  Objective:   Physical Exam   BP 138/94 mmHg  Pulse 84  Temp(Src) 98.4 F (36.9 C) (Oral)  Wt 262 lb (118.842 kg)  SpO2 98% Wt Readings from Last 3 Encounters:  06/30/15 262 lb (118.842 kg)  04/28/15 266 lb 8 oz (120.884 kg)  04/23/15 266 lb 12 oz (120.997 kg)    General: Appears his stated age,  obese in NAD. Cardiovascular: Normal rate and rhythm. S1,S2 noted.  No murmur, rubs or gallops noted.  Pulmonary/Chest: Normal effort and positive vesicular breath sounds. No respiratory distress. No wheezes, rales or ronchi noted.  Neurological: Alert and oriented.  Psychiatric: Mood and affect normal. Behavior is normal. Judgment and thought content normal.     BMET    Component Value Date/Time   NA 136 06/17/2014 1507   K 4.1 06/17/2014 1507   CL 100 06/17/2014 1507   CO2 27 06/17/2014 1507   GLUCOSE 101* 06/17/2014 1507   BUN 10 06/17/2014 1507   CREATININE 1.1 06/17/2014 1507   CALCIUM 9.0 06/17/2014 1507   GFRNONAA >90 12/02/2012 2009   GFRAA >90 12/02/2012 2009    Lipid Panel     Component  Value Date/Time   CHOL 210* 10/05/2014 1642   TRIG 405.0* 10/05/2014 1642   HDL 35.80* 10/05/2014 1642   CHOLHDL 6 10/05/2014 1642   VLDL 81.0* 10/05/2014 1642   LDLCALC 130* 06/17/2014 1507    CBC    Component Value Date/Time   WBC 9.0 06/17/2014 1507   RBC 5.45 06/17/2014 1507   HGB 15.5 06/17/2014 1507   HCT 45.8 06/17/2014 1507   PLT 290.0 06/17/2014 1507   MCV 83.9 06/17/2014 1507   MCH 28.5 12/02/2012 2009   MCHC 33.8 06/17/2014 1507   RDW 13.6 06/17/2014 1507   LYMPHSABS 0.7 12/02/2012 2009   MONOABS 0.5 12/02/2012 2009   EOSABS 0.0 12/02/2012 2009   BASOSABS 0.0 12/02/2012 2009    Hgb A1C Lab Results  Component Value Date   HGBA1C 5.9 11/14/2012        Assessment & Plan:

## 2015-06-30 NOTE — Addendum Note (Signed)
Addended by: Lurlean Nanny on: 06/30/2015 04:25 PM   Modules accepted: Orders

## 2015-06-30 NOTE — Assessment & Plan Note (Signed)
Injection today Make a lab only appt in 2 weeks to recheck testosterone levels, make lab before 10 am If level still low, we will have you start giving your testosterone every 2 weeks

## 2015-06-30 NOTE — Progress Notes (Signed)
Pre visit review using our clinic review tool, if applicable. No additional management support is needed unless otherwise documented below in the visit note. 

## 2015-07-14 ENCOUNTER — Other Ambulatory Visit (INDEPENDENT_AMBULATORY_CARE_PROVIDER_SITE_OTHER): Payer: BLUE CROSS/BLUE SHIELD

## 2015-07-14 DIAGNOSIS — E349 Endocrine disorder, unspecified: Secondary | ICD-10-CM

## 2015-07-14 DIAGNOSIS — E785 Hyperlipidemia, unspecified: Secondary | ICD-10-CM

## 2015-07-14 DIAGNOSIS — E291 Testicular hypofunction: Secondary | ICD-10-CM | POA: Diagnosis not present

## 2015-07-14 LAB — COMPREHENSIVE METABOLIC PANEL
ALBUMIN: 4.4 g/dL (ref 3.5–5.2)
ALT: 31 U/L (ref 0–53)
AST: 26 U/L (ref 0–37)
Alkaline Phosphatase: 85 U/L (ref 39–117)
BILIRUBIN TOTAL: 0.5 mg/dL (ref 0.2–1.2)
BUN: 8 mg/dL (ref 6–23)
CO2: 29 mEq/L (ref 19–32)
CREATININE: 1.13 mg/dL (ref 0.40–1.50)
Calcium: 9.7 mg/dL (ref 8.4–10.5)
Chloride: 101 mEq/L (ref 96–112)
GFR: 74.59 mL/min (ref >60.00)
Glucose, Bld: 98 mg/dL (ref 70–99)
Potassium: 3.8 mEq/L (ref 3.5–5.1)
SODIUM: 137 meq/L (ref 135–145)
TOTAL PROTEIN: 7.5 g/dL (ref 6.0–8.3)

## 2015-07-14 LAB — CBC
HEMATOCRIT: 45.8 % (ref 39.0–52.0)
HEMOGLOBIN: 15.4 g/dL (ref 13.0–17.0)
MCHC: 33.7 g/dL (ref 30.0–36.0)
MCV: 82.7 fl (ref 78.0–100.0)
Platelets: 317 10*3/uL (ref 150.0–400.0)
RBC: 5.54 Mil/uL (ref 4.22–5.81)
RDW: 15 % (ref 11.5–15.5)
WBC: 7 10*3/uL (ref 4.0–10.5)

## 2015-07-14 LAB — TESTOSTERONE: Testosterone: 207.88 ng/dL — ABNORMAL LOW (ref 300.00–890.00)

## 2015-07-20 ENCOUNTER — Encounter: Payer: Self-pay | Admitting: Internal Medicine

## 2015-07-20 DIAGNOSIS — E349 Endocrine disorder, unspecified: Secondary | ICD-10-CM

## 2015-07-20 MED ORDER — TESTOSTERONE CYPIONATE 200 MG/ML IM SOLN: 200.0000 mg | INTRAMUSCULAR | Status: DC

## 2015-07-27 ENCOUNTER — Ambulatory Visit (INDEPENDENT_AMBULATORY_CARE_PROVIDER_SITE_OTHER): Payer: BLUE CROSS/BLUE SHIELD

## 2015-07-27 DIAGNOSIS — E291 Testicular hypofunction: Secondary | ICD-10-CM | POA: Diagnosis not present

## 2015-07-27 DIAGNOSIS — E349 Endocrine disorder, unspecified: Secondary | ICD-10-CM

## 2015-07-27 MED ORDER — TESTOSTERONE CYPIONATE 200 MG/ML IM SOLN
200.0000 mg | INTRAMUSCULAR | Status: DC
  Administered 2015-07-27: 200 mg via INTRAMUSCULAR

## 2015-08-11 ENCOUNTER — Ambulatory Visit (INDEPENDENT_AMBULATORY_CARE_PROVIDER_SITE_OTHER): Payer: BLUE CROSS/BLUE SHIELD | Admitting: *Deleted

## 2015-08-11 DIAGNOSIS — E349 Endocrine disorder, unspecified: Secondary | ICD-10-CM

## 2015-08-11 DIAGNOSIS — E291 Testicular hypofunction: Secondary | ICD-10-CM | POA: Diagnosis not present

## 2015-08-11 MED ORDER — TESTOSTERONE CYPIONATE 200 MG/ML IM SOLN
200.0000 mg | INTRAMUSCULAR | Status: DC
  Administered 2015-08-11: 200 mg via INTRAMUSCULAR

## 2015-08-19 ENCOUNTER — Other Ambulatory Visit: Payer: Self-pay | Admitting: Internal Medicine

## 2015-08-25 ENCOUNTER — Ambulatory Visit (INDEPENDENT_AMBULATORY_CARE_PROVIDER_SITE_OTHER): Payer: BLUE CROSS/BLUE SHIELD | Admitting: *Deleted

## 2015-08-25 DIAGNOSIS — E291 Testicular hypofunction: Secondary | ICD-10-CM | POA: Diagnosis not present

## 2015-08-25 DIAGNOSIS — E349 Endocrine disorder, unspecified: Secondary | ICD-10-CM

## 2015-08-25 MED ORDER — TESTOSTERONE CYPIONATE 200 MG/ML IM SOLN
200.0000 mg | INTRAMUSCULAR | Status: DC
  Administered 2015-08-25: 200 mg via INTRAMUSCULAR

## 2015-09-09 ENCOUNTER — Ambulatory Visit: Payer: BLUE CROSS/BLUE SHIELD

## 2015-10-14 ENCOUNTER — Ambulatory Visit: Payer: BLUE CROSS/BLUE SHIELD

## 2015-11-08 ENCOUNTER — Other Ambulatory Visit: Payer: Self-pay | Admitting: Internal Medicine

## 2015-11-08 DIAGNOSIS — N529 Male erectile dysfunction, unspecified: Secondary | ICD-10-CM

## 2015-11-08 MED ORDER — TADALAFIL 20 MG PO TABS: 10.0000 mg | ORAL_TABLET | ORAL | Status: DC | PRN

## 2015-11-12 ENCOUNTER — Telehealth: Payer: Self-pay

## 2015-11-12 NOTE — Telephone Encounter (Signed)
PA has been submitted through covermymeds.

## 2015-11-16 ENCOUNTER — Other Ambulatory Visit: Payer: Self-pay | Admitting: Internal Medicine

## 2015-11-16 ENCOUNTER — Encounter: Payer: Self-pay | Admitting: Internal Medicine

## 2015-11-16 DIAGNOSIS — N529 Male erectile dysfunction, unspecified: Secondary | ICD-10-CM

## 2015-11-16 MED ORDER — TADALAFIL 20 MG PO TABS
10.0000 mg | ORAL_TABLET | ORAL | Status: DC | PRN
Start: 2015-11-16 — End: 2015-11-24

## 2015-11-16 MED ORDER — NORTRIPTYLINE HCL 10 MG PO CAPS: 20.0000 mg | ORAL_CAPSULE | Freq: Every day | ORAL | Status: DC

## 2015-11-17 ENCOUNTER — Other Ambulatory Visit: Payer: Self-pay | Admitting: Internal Medicine

## 2015-11-17 NOTE — Telephone Encounter (Signed)
PA was denied with an error msg stating [facility] [employer] was not correct party they are listed on patient's insurance card and PA from pharmacy---PA resubmitted to [place] through [email or web address]---awaiting response

## 2015-11-24 ENCOUNTER — Ambulatory Visit (INDEPENDENT_AMBULATORY_CARE_PROVIDER_SITE_OTHER): Payer: BLUE CROSS/BLUE SHIELD | Admitting: Internal Medicine

## 2015-11-24 ENCOUNTER — Encounter: Payer: Self-pay | Admitting: Internal Medicine

## 2015-11-24 VITALS — BP 134/84 | HR 74 | Temp 98.3°F | Ht 71.0 in | Wt 261.0 lb

## 2015-11-24 DIAGNOSIS — E291 Testicular hypofunction: Secondary | ICD-10-CM | POA: Diagnosis not present

## 2015-11-24 DIAGNOSIS — I10 Essential (primary) hypertension: Secondary | ICD-10-CM

## 2015-11-24 DIAGNOSIS — Z0000 Encounter for general adult medical examination without abnormal findings: Secondary | ICD-10-CM

## 2015-11-24 DIAGNOSIS — E785 Hyperlipidemia, unspecified: Secondary | ICD-10-CM | POA: Diagnosis not present

## 2015-11-24 DIAGNOSIS — F411 Generalized anxiety disorder: Secondary | ICD-10-CM

## 2015-11-24 DIAGNOSIS — K219 Gastro-esophageal reflux disease without esophagitis: Secondary | ICD-10-CM | POA: Insufficient documentation

## 2015-11-24 DIAGNOSIS — E349 Endocrine disorder, unspecified: Secondary | ICD-10-CM

## 2015-11-24 DIAGNOSIS — N529 Male erectile dysfunction, unspecified: Secondary | ICD-10-CM | POA: Diagnosis not present

## 2015-11-24 MED ORDER — SILDENAFIL CITRATE 25 MG PO TABS: 25.0000 mg | ORAL_TABLET | Freq: Every day | ORAL | Status: DC | PRN

## 2015-11-24 MED ORDER — OMEPRAZOLE 20 MG PO CPDR: 20.0000 mg | DELAYED_RELEASE_CAPSULE | Freq: Every day | ORAL | Status: DC

## 2015-11-24 NOTE — Patient Instructions (Signed)

## 2015-11-24 NOTE — Assessment & Plan Note (Signed)
Controlled off meds.

## 2015-11-24 NOTE — Progress Notes (Signed)
Subjective:    Patient ID: Kenneth Sherman, male    DOB: 05-25-1971, 44 y.o.   MRN: [id number]  HPI  Pt presents to the clinic today for his annual exam. He is also due for follow up of chronic condition, see separate note.  Flu: never Tetanus: 2013 Vision Screening: yearly Dentis: annually  Diet: He does consume meat. He consumes fruits and veggies a few times per week. He tries to avoid fried foods. He does drink a lot of water. Exercise: He plays softball during the Spring, no exercise currently.  Review of Systems      Past Medical History  Diagnosis Date  . Right knee DJD   . Hypertension     no meds    Current Outpatient Prescriptions  Medication Sig Dispense Refill  . atorvastatin (LIPITOR) 10 MG tablet Take 1 tablet (10 mg total) by mouth daily. 30 tablet 3  . citalopram (CELEXA) 20 MG tablet Take 1 tablet (20 mg total) by mouth daily. NEED TO SCHEDULE ANNUAL PHYSICAL FOR FURTHER REFILLS 30 tablet 1  . nortriptyline (PAMELOR) 10 MG capsule Take 2 capsules (20 mg total) by mouth at bedtime. 30 capsule 2  . testosterone cypionate (DEPO-TESTOSTERONE) 200 MG/ML injection Inject 1 mL (200 mg total) into the muscle every 14 (fourteen) days. 10 mL 0   No current facility-administered medications for this visit.   Facility-Administered Medications Ordered in Other Visits  Medication Dose Route Frequency Provider Last Rate Last Dose  . chlorhexidine (HIBICLENS) 4 % liquid 4 application  60 mL Topical Once Kirstin Shepperson, PA-C        No Known Allergies  Family History  Problem Relation Age of Onset  . Heart attack Mother   . Hypertension Mother   . Hypertension Father   . Hypertension Brother   . Heart attack Father   . Heart attack Father     Social History   Social History  . Marital Status: Married    Spouse Name: N/A  . Number of Children: N/A  . Years of Education: 14   Occupational History  . CAD Designer    Social History Main Topics  . Smoking  status: Never Smoker   . Smokeless tobacco: Never Used  . Alcohol Use: 1.2 oz/week    1 Glasses of wine, 1 Cans of beer per week     Comment: occasionally  . Drug Use: No  . Sexual Activity: Yes    Birth Control/ Protection: Condom   Other Topics Concern  . Not on file   Social History Narrative   Regular exercise-no   Caffeine Use-yes     Constitutional: Denies fever, malaise, fatigue, headache or abrupt weight changes.  HEENT: Denies eye pain, eye redness, ear pain, ringing in the ears, wax buildup, runny nose, nasal congestion, bloody nose, or sore throat. Respiratory: Denies difficulty breathing, shortness of breath, cough or sputum production.   Cardiovascular: Denies chest pain, chest tightness, palpitations or swelling in the hands or feet.  Gastrointestinal: Pt reports reflux. Denies abdominal pain, bloating, constipation, diarrhea or blood in the stool.  GU: Denies urgency, frequency, pain with urination, burning sensation, blood in urine, odor or discharge. Musculoskeletal: Denies decrease in range of motion, difficulty with gait, muscle pain or joint pain and swelling.  Skin: Denies redness, rashes, lesions or ulcercations.  Neurological: Denies dizziness, difficulty with memory, difficulty with speech or problems with balance and coordination.  Psych: Pt reports chronic anxiety. Denies depression, SI/HI.  No other specific  complaints in a complete review of systems (except as listed in HPI above).  Objective:   Physical Exam   BP 134/84 mmHg  Pulse 74  Temp(Src) 98.3 F (36.8 C) (Oral)  Ht 5\' 11"  (1.803 m)  Wt 261 lb (118.389 kg)  BMI 36.42 kg/m2  SpO2 98%  Wt Readings from Last 3 Encounters:  11/24/15 261 lb (118.389 kg)  06/30/15 262 lb (118.842 kg)  04/28/15 266 lb 8 oz (120.884 kg)    General: Appears his stated age, obese in NAD. Skin: Warm, dry and intact. No rashes, lesions or ulcerations noted. HEENT: Head: normal shape and size; Eyes: sclera  white, no icterus, conjunctiva pink, PERRLA and EOMs intact; Ears: Bilateral cerumen impaction; Throat/Mouth: Teeth present, mucosa pink and moist, no exudate, lesions or ulcerations noted.  Neck:  Neck supple, trachea midline. No masses, lumps or thyromegaly present.  Cardiovascular: Normal rate and rhythm. S1,S2 noted.  No murmur, rubs or gallops noted. No BLE edema. No carotid bruits noted. Pulmonary/Chest: Normal effort and positive vesicular breath sounds. No respiratory distress. No wheezes, rales or ronchi noted.  Abdomen: Soft and nontender. Normal bowel sounds. No distention or masses noted. Liver, spleen and kidneys non palpable. Musculoskeletal: Strength 5/5 BUE/BLE. No signs of joint swelling. No difficulty with gait.  Neurological: Alert and oriented. Cranial nerves II-XII grossly intact. Coordination normal.  Psychiatric: Mood and affect normal. Behavior is normal. Judgment and thought content normal.    BMET    Component Value Date/Time   NA 137 07/14/2015 0942   K 3.8 07/14/2015 0942   CL 101 07/14/2015 0942   CO2 29 07/14/2015 0942   GLUCOSE 98 07/14/2015 0942   BUN 8 07/14/2015 0942   CREATININE 1.13 07/14/2015 0942   CALCIUM 9.7 07/14/2015 0942   GFRNONAA >90 12/02/2012 2009   GFRAA >90 12/02/2012 2009    Lipid Panel     Component Value Date/Time   CHOL 210* 10/05/2014 1642   TRIG 405.0* 10/05/2014 1642   HDL 35.80* 10/05/2014 1642   CHOLHDL 6 10/05/2014 1642   VLDL 81.0* 10/05/2014 1642   LDLCALC 130* 06/17/2014 1507    CBC    Component Value Date/Time   WBC 7.0 07/14/2015 0942   RBC 5.54 07/14/2015 0942   HGB 15.4 07/14/2015 0942   HCT 45.8 07/14/2015 0942   PLT 317.0 07/14/2015 0942   MCV 82.7 07/14/2015 0942   MCH 28.5 12/02/2012 2009   MCHC 33.7 07/14/2015 0942   RDW 15.0 07/14/2015 0942   LYMPHSABS 0.7 12/02/2012 2009   MONOABS 0.5 12/02/2012 2009   EOSABS 0.0 12/02/2012 2009   BASOSABS 0.0 12/02/2012 2009    Hgb A1C Lab Results    Component Value Date   HGBA1C 5.9 11/14/2012        Assessment & Plan:   Preventative Health Maintenance:  He declines flu shot today Tetanus UTD Encouraged him to consume a balanced diet and start an exercise regimen Encouraged him to see an eye doctor and dentist at least annually Will check CBC, CMET, Lipid and A1C today  RTC in 1 year or sooner if needed  HPI:  Pt presents to the clinic today to follow up chronic conditions:  Frequent Headaches: Improved on Nortriptyline. He does get a headache when he forgets to take his medication. He is following with Dr. Melrose Nakayama. CT of the head 11/2014 was normal.  Erectile Dysfunction: He reports his insurance will no longer pay for Cialis. He would like th try Viagra.  GAD: Symptoms well controlled on Celexa. He denies depression, SI/HI.  HTN: BP controlled off meds. His BP today is 134/84.  Hypotestosteronism: He is getting biweekly testosterone injections. His levels were last checked 07/2014, still low.  HLD: He is no longer taking Lipitor. His last LDL was 134.9. He does try to consume a low fat diet.  Review of Systems:   Past Medical History  Diagnosis Date  . Right knee DJD   . Hypertension     no meds    Current Outpatient Prescriptions  Medication Sig Dispense Refill  . atorvastatin (LIPITOR) 10 MG tablet Take 1 tablet (10 mg total) by mouth daily. 30 tablet 3  . citalopram (CELEXA) 20 MG tablet Take 1 tablet (20 mg total) by mouth daily. NEED TO SCHEDULE ANNUAL PHYSICAL FOR FURTHER REFILLS 30 tablet 1  . nortriptyline (PAMELOR) 10 MG capsule Take 2 capsules (20 mg total) by mouth at bedtime. 30 capsule 2  . testosterone cypionate (DEPO-TESTOSTERONE) 200 MG/ML injection Inject 1 mL (200 mg total) into the muscle every 14 (fourteen) days. 10 mL 0  . sildenafil (VIAGRA) 25 MG tablet Take 1 tablet (25 mg total) by mouth daily as needed for erectile dysfunction. 10 tablet 0   No current facility-administered  medications for this visit.   Facility-Administered Medications Ordered in Other Visits  Medication Dose Route Frequency Provider Last Rate Last Dose  . chlorhexidine (HIBICLENS) 4 % liquid 4 application  60 mL Topical Once Kirstin Shepperson, PA-C        No Known Allergies  Family History  Problem Relation Age of Onset  . Heart attack Mother   . Hypertension Mother   . Hypertension Father   . Hypertension Brother   . Heart attack Father   . Heart attack Father     Social History   Social History  . Marital Status: Married    Spouse Name: N/A  . Number of Children: N/A  . Years of Education: 14   Occupational History  . CAD Designer    Social History Main Topics  . Smoking status: Never Smoker   . Smokeless tobacco: Never Used  . Alcohol Use: 1.2 oz/week    1 Glasses of wine, 1 Cans of beer per week     Comment: occasionally  . Drug Use: No  . Sexual Activity: Yes    Birth Control/ Protection: Condom   Other Topics Concern  . Not on file   Social History Narrative   Regular exercise-no   Caffeine Use-yes   Constitutional: Denies fever, malaise, fatigue, headache or abrupt weight changes.  Respiratory: Denies difficulty breathing, shortness of breath, cough or sputum production.   Cardiovascular: Denies chest pain, chest tightness, palpitations or swelling in the hands or feet.  Gastrointestinal: Pt reports reflux. Denies abdominal pain, bloating, constipation, diarrhea or blood in the stool.  Neurological: Denies dizziness, difficulty with memory, difficulty with speech or problems with balance and coordination.  Psych: Pt reports chronic anxiety. Denies depression, SI/HI.    No other specific complaints in a complete review of systems (except as listed in HPI above).  Objective:  BP 134/84 mmHg  Pulse 74  Temp(Src) 98.3 F (36.8 C) (Oral)  Ht 5\' 11"  (1.803 m)  Wt 261 lb (118.389 kg)  BMI 36.42 kg/m2  SpO2 98% Wt Readings from Last 3 Encounters:    11/24/15 261 lb (118.389 kg)  06/30/15 262 lb (118.842 kg)  04/28/15 266 lb 8 oz (120.884 kg)   General: Appears  his stated age, obese in NAD. Cardiovascular: Normal rate and rhythm. S1,S2 noted.  No murmur, rubs or gallops noted. No BLE edema. No carotid bruits noted. Pulmonary/Chest: Normal effort and positive vesicular breath sounds. No respiratory distress. No wheezes, rales or ronchi noted.  Abdomen: Soft and nontender. Normal bowel sounds. No distention or masses noted. Liver, spleen and kidneys non palpable. Neurological: Alert and oriented.  Psychiatric: Mood and affect normal. Behavior is normal. Judgment and thought content normal.   BMET    Component Value Date/Time   NA 137 07/14/2015 0942   K 3.8 07/14/2015 0942   CL 101 07/14/2015 0942   CO2 29 07/14/2015 0942   GLUCOSE 98 07/14/2015 0942   BUN 8 07/14/2015 0942   CREATININE 1.13 07/14/2015 0942   CALCIUM 9.7 07/14/2015 0942   GFRNONAA >90 12/02/2012 2009   GFRAA >90 12/02/2012 2009    Lipid Panel     Component Value Date/Time   CHOL 210* 10/05/2014 1642   TRIG 405.0* 10/05/2014 1642   HDL 35.80* 10/05/2014 1642   CHOLHDL 6 10/05/2014 1642   VLDL 81.0* 10/05/2014 1642   LDLCALC 130* 06/17/2014 1507    CBC    Component Value Date/Time   WBC 7.0 07/14/2015 0942   RBC 5.54 07/14/2015 0942   HGB 15.4 07/14/2015 0942   HCT 45.8 07/14/2015 0942   PLT 317.0 07/14/2015 0942   MCV 82.7 07/14/2015 0942   MCH 28.5 12/02/2012 2009   MCHC 33.7 07/14/2015 0942   RDW 15.0 07/14/2015 0942   LYMPHSABS 0.7 12/02/2012 2009   MONOABS 0.5 12/02/2012 2009   EOSABS 0.0 12/02/2012 2009   BASOSABS 0.0 12/02/2012 2009    Hgb A1C Lab Results  Component Value Date   HGBA1C 5.9 11/14/2012    Assessment and Plan:

## 2015-11-24 NOTE — Progress Notes (Signed)
Pre visit review using our clinic review tool, if applicable. No additional management support is needed unless otherwise documented below in the visit note. 

## 2015-11-24 NOTE — Assessment & Plan Note (Signed)
Encouraged him to consume a low fat diet Will check Lipid Profile and CMET today Advised him to start a baby ASA daily

## 2015-11-24 NOTE — Assessment & Plan Note (Signed)
He is due for testosterone injection today Will repeat testosterone level at a future date

## 2015-11-24 NOTE — Assessment & Plan Note (Signed)
RX for Viagra sent to pharmacy If insurance will not cover, will send RX to [facility] and he will not run through insurance

## 2015-11-24 NOTE — Assessment & Plan Note (Signed)
Controlled on Celexa

## 2015-11-24 NOTE — Assessment & Plan Note (Signed)
New onset Start Prilosec, RX sent to pharmacy Discussed how weight loss would help improve his reflux

## 2015-11-25 LAB — COMPREHENSIVE METABOLIC PANEL
ALBUMIN: 4.4 g/dL (ref 3.5–5.2)
ALK PHOS: 83 U/L (ref 39–117)
ALT: 27 U/L (ref 0–53)
AST: 22 U/L (ref 0–37)
BUN: 11 mg/dL (ref 6–23)
CALCIUM: 9.4 mg/dL (ref 8.4–10.5)
CO2: 32 mEq/L (ref 19–32)
Chloride: 101 mEq/L (ref 96–112)
Creatinine, Ser: 1.24 mg/dL (ref 0.40–1.50)
GFR: 66.9 mL/min (ref >60.00)
Glucose, Bld: 97 mg/dL (ref 70–99)
POTASSIUM: 4.1 meq/L (ref 3.5–5.1)
SODIUM: 139 meq/L (ref 135–145)
TOTAL PROTEIN: 7.8 g/dL (ref 6.0–8.3)
Total Bilirubin: 0.3 mg/dL (ref 0.2–1.2)

## 2015-11-25 LAB — LIPID PANEL
CHOLESTEROL: 195 mg/dL (ref 0–200)
HDL: 37.6 mg/dL — ABNORMAL LOW (ref >39.00)
NonHDL: 157.71
TRIGLYCERIDES: 311 mg/dL — AB (ref 0.0–149.0)
Total CHOL/HDL Ratio: 5
VLDL: 62.2 mg/dL — AB (ref 0.0–40.0)

## 2015-11-25 LAB — CBC
HCT: 47.4 % (ref 39.0–52.0)
Hemoglobin: 15.5 g/dL (ref 13.0–17.0)
MCHC: 32.8 g/dL (ref 30.0–36.0)
MCV: 81.9 fl (ref 78.0–100.0)
PLATELETS: 320 10*3/uL (ref 150.0–400.0)
RBC: 5.78 Mil/uL (ref 4.22–5.81)
RDW: 14.7 % (ref 11.5–15.5)
WBC: 9 10*3/uL (ref 4.0–10.5)

## 2015-11-25 LAB — HEMOGLOBIN A1C: Hgb A1c MFr Bld: 6.2 % (ref 4.6–6.5)

## 2015-11-25 LAB — LDL CHOLESTEROL, DIRECT: Direct LDL: 130 mg/dL

## 2015-12-01 ENCOUNTER — Other Ambulatory Visit: Payer: Self-pay | Admitting: Internal Medicine

## 2015-12-01 DIAGNOSIS — N529 Male erectile dysfunction, unspecified: Secondary | ICD-10-CM

## 2015-12-02 ENCOUNTER — Encounter: Payer: Self-pay | Admitting: Internal Medicine

## 2015-12-02 MED ORDER — SILDENAFIL CITRATE 20 MG PO TABS: ORAL_TABLET | ORAL | Status: DC

## 2015-12-02 NOTE — Telephone Encounter (Signed)
Call pt:  I have not received anything from the pharmacy requesting a prior auth. Has he tried to go pick it up?

## 2015-12-02 NOTE — Telephone Encounter (Signed)
Medication sent electronically 

## 2015-12-02 NOTE — Telephone Encounter (Signed)
Per [facility] pharmacy they said insurance will only cover Sildenefil 20mg --please advise if okay to change

## 2015-12-08 ENCOUNTER — Ambulatory Visit (INDEPENDENT_AMBULATORY_CARE_PROVIDER_SITE_OTHER): Payer: BLUE CROSS/BLUE SHIELD

## 2015-12-08 DIAGNOSIS — E291 Testicular hypofunction: Secondary | ICD-10-CM

## 2015-12-08 DIAGNOSIS — E349 Endocrine disorder, unspecified: Secondary | ICD-10-CM

## 2015-12-08 MED ORDER — TESTOSTERONE CYPIONATE 200 MG/ML IM SOLN
200.0000 mg | INTRAMUSCULAR | Status: DC
  Administered 2015-12-08: 200 mg via INTRAMUSCULAR

## 2015-12-08 MED ORDER — TESTOSTERONE CYPIONATE 200 MG/ML IM SOLN
200.0000 mg | Freq: Once | INTRAMUSCULAR | Status: AC
  Administered 2015-11-24: 200 mg via INTRAMUSCULAR

## 2015-12-08 NOTE — Addendum Note (Signed)
Addended by: Lurlean Nanny on: 12/08/2015 04:22 PM   Modules accepted: Orders

## 2015-12-22 ENCOUNTER — Ambulatory Visit: Payer: BLUE CROSS/BLUE SHIELD

## 2015-12-29 ENCOUNTER — Ambulatory Visit (INDEPENDENT_AMBULATORY_CARE_PROVIDER_SITE_OTHER): Payer: BLUE CROSS/BLUE SHIELD

## 2015-12-29 DIAGNOSIS — E291 Testicular hypofunction: Secondary | ICD-10-CM

## 2015-12-29 DIAGNOSIS — E349 Endocrine disorder, unspecified: Secondary | ICD-10-CM

## 2015-12-29 MED ORDER — TESTOSTERONE CYPIONATE 200 MG/ML IM SOLN
200.0000 mg | INTRAMUSCULAR | Status: DC
  Administered 2015-12-29: 200 mg via INTRAMUSCULAR

## 2016-01-02 ENCOUNTER — Other Ambulatory Visit: Payer: Self-pay | Admitting: Internal Medicine

## 2016-01-12 ENCOUNTER — Ambulatory Visit (INDEPENDENT_AMBULATORY_CARE_PROVIDER_SITE_OTHER): Payer: BLUE CROSS/BLUE SHIELD

## 2016-01-12 DIAGNOSIS — E291 Testicular hypofunction: Secondary | ICD-10-CM

## 2016-01-12 DIAGNOSIS — E349 Endocrine disorder, unspecified: Secondary | ICD-10-CM

## 2016-01-12 MED ORDER — TESTOSTERONE CYPIONATE 200 MG/ML IM SOLN: 200.0000 mg | Freq: Once | INTRAMUSCULAR | Status: DC

## 2016-01-12 MED ORDER — TESTOSTERONE CYPIONATE 200 MG/ML IM SOLN: 200.0000 mg | INTRAMUSCULAR | Status: DC

## 2016-01-12 MED ORDER — TESTOSTERONE CYPIONATE 200 MG/ML IM SOLN
200.0000 mg | Freq: Once | INTRAMUSCULAR | Status: AC
Start: 2016-01-12 — End: 2016-01-12
  Administered 2016-01-12: 200 mg via INTRAMUSCULAR

## 2016-01-24 ENCOUNTER — Other Ambulatory Visit: Payer: Self-pay | Admitting: Internal Medicine

## 2016-01-26 ENCOUNTER — Ambulatory Visit (INDEPENDENT_AMBULATORY_CARE_PROVIDER_SITE_OTHER): Payer: BLUE CROSS/BLUE SHIELD

## 2016-01-26 DIAGNOSIS — E291 Testicular hypofunction: Secondary | ICD-10-CM | POA: Diagnosis not present

## 2016-01-26 DIAGNOSIS — E349 Endocrine disorder, unspecified: Secondary | ICD-10-CM

## 2016-01-26 MED ORDER — TESTOSTERONE CYPIONATE 200 MG/ML IM SOLN
200.0000 mg | INTRAMUSCULAR | Status: DC
  Administered 2016-01-26: 200 mg via INTRAMUSCULAR

## 2016-02-03 ENCOUNTER — Other Ambulatory Visit: Payer: Self-pay | Admitting: Internal Medicine

## 2016-02-03 DIAGNOSIS — R7303 Prediabetes: Secondary | ICD-10-CM

## 2016-02-09 ENCOUNTER — Ambulatory Visit (INDEPENDENT_AMBULATORY_CARE_PROVIDER_SITE_OTHER): Payer: BLUE CROSS/BLUE SHIELD

## 2016-02-09 ENCOUNTER — Ambulatory Visit: Payer: BLUE CROSS/BLUE SHIELD

## 2016-02-09 ENCOUNTER — Other Ambulatory Visit: Payer: Self-pay | Admitting: Internal Medicine

## 2016-02-09 ENCOUNTER — Other Ambulatory Visit (INDEPENDENT_AMBULATORY_CARE_PROVIDER_SITE_OTHER): Payer: BLUE CROSS/BLUE SHIELD

## 2016-02-09 ENCOUNTER — Encounter: Payer: Self-pay | Admitting: Internal Medicine

## 2016-02-09 DIAGNOSIS — E349 Endocrine disorder, unspecified: Secondary | ICD-10-CM

## 2016-02-09 DIAGNOSIS — R7303 Prediabetes: Secondary | ICD-10-CM

## 2016-02-09 DIAGNOSIS — E291 Testicular hypofunction: Secondary | ICD-10-CM

## 2016-02-09 LAB — LIPID PANEL
CHOLESTEROL: 171 mg/dL (ref 0–200)
HDL: 34.6 mg/dL — AB (ref >39.00)
LDL CALC: 108 mg/dL — AB (ref 0–99)
NonHDL: 135.97
Total CHOL/HDL Ratio: 5
Triglycerides: 140 mg/dL (ref 0.0–149.0)
VLDL: 28 mg/dL (ref 0.0–40.0)

## 2016-02-09 LAB — TESTOSTERONE: TESTOSTERONE: 240.98 ng/dL — AB (ref 300.00–890.00)

## 2016-02-09 MED ORDER — TESTOSTERONE CYPIONATE 200 MG/ML IM SOLN
200.0000 mg | Freq: Once | INTRAMUSCULAR | Status: AC
  Administered 2016-02-09: 200 mg via INTRAMUSCULAR

## 2016-02-09 NOTE — Addendum Note (Signed)
Addended by: Marchia Bond on: 02/09/2016 09:11 AM   Modules accepted: Orders

## 2016-02-23 ENCOUNTER — Ambulatory Visit: Payer: BLUE CROSS/BLUE SHIELD

## 2016-02-24 ENCOUNTER — Ambulatory Visit: Payer: BLUE CROSS/BLUE SHIELD

## 2016-02-24 ENCOUNTER — Ambulatory Visit: Payer: Self-pay | Admitting: Urology

## 2016-02-24 ENCOUNTER — Other Ambulatory Visit: Payer: Self-pay | Admitting: Internal Medicine

## 2016-02-25 ENCOUNTER — Ambulatory Visit: Payer: BLUE CROSS/BLUE SHIELD

## 2016-03-02 ENCOUNTER — Ambulatory Visit (INDEPENDENT_AMBULATORY_CARE_PROVIDER_SITE_OTHER): Payer: BLUE CROSS/BLUE SHIELD | Admitting: Urology

## 2016-03-02 ENCOUNTER — Encounter: Payer: Self-pay | Admitting: Urology

## 2016-03-02 VITALS — BP 150/95 | HR 71 | Ht 71.0 in | Wt 276.2 lb

## 2016-03-02 DIAGNOSIS — N528 Other male erectile dysfunction: Secondary | ICD-10-CM | POA: Diagnosis not present

## 2016-03-02 DIAGNOSIS — N401 Enlarged prostate with lower urinary tract symptoms: Secondary | ICD-10-CM

## 2016-03-02 DIAGNOSIS — N138 Other obstructive and reflux uropathy: Secondary | ICD-10-CM

## 2016-03-02 DIAGNOSIS — E291 Testicular hypofunction: Secondary | ICD-10-CM | POA: Diagnosis not present

## 2016-03-02 DIAGNOSIS — N529 Male erectile dysfunction, unspecified: Secondary | ICD-10-CM

## 2016-03-02 MED ORDER — CLOMIPHENE CITRATE 50 MG PO TABS: ORAL_TABLET | ORAL | Status: DC

## 2016-03-02 NOTE — Progress Notes (Signed)
03/02/2016 4:26 PM   Kenneth Sherman February 07, 1971 [id number]  Referring provider: Jearld Fenton, NP [address] [place], [place] [address]  Chief Complaint  Patient presents with  . Hypogonadism    referred by [facility]    HPI: Patient is a 45 year old Caucasian male with hypogonadism that is not responding well to testosterone cypionate injections who is referred to [place] by his PCP, Kenneth Cork  NP, for further management.  Hypogonadism Patient is experiencing a decrease in libido, a lack of energy, a decrease in strength and a recent deterioration in an ability to play sports.   This is indicated by his responses to the ADAM questionnaire.  His pretreatment testosterone level was 240.98 ng/dL on 02/09/2016 and 207.88 ng/dL on 07/14/2015.   He is currently managing his hypogonadism with testosterone cypionate, 200 mg every two weeks without improvement in his symptoms or testosterone levels.          Androgen Deficiency in the Aging Male      03/02/16 1600       Androgen Deficiency in the Aging Male   Do you have a decrease in libido (sex drive) Yes     Do you have lack of energy Yes     Do you have a decrease in strength and/or endurance Yes     Have you lost height No     Have you noticed a decreased "enjoyment of life" No     Are you sad and/or grumpy No     Are your erections less strong No     Have you noticed a recent deterioration in your ability to play sports Yes     Are you falling asleep after dinner No     Has there been a recent deterioration in your work performance No        Erectile dysfunction His SHIM score is 23, which is no erectile dysfunction.   He does complaint of a mild difficulty with achieving erections.  His libido is diminished.   His risk factors for ED are hypogonadism, HTN and increased BMI .  He denies any painful erections or curvatures with his erections.   He has tried sildenafil in the past with good  results.     SHIM      03/02/16 1604       SHIM: Over the last 6 months:   How do you rate your confidence that you could get and keep an erection? Moderate     When you had erections with sexual stimulation, how often were your erections hard enough for penetration (entering your partner)? Almost Always or Always     During sexual intercourse, how often were you able to maintain your erection after you had penetrated (entered) your partner? Not Difficult     During sexual intercourse, how difficult was it to maintain your erection to completion of intercourse? Not Difficult     When you attempted sexual intercourse, how often was it satisfactory for you? Not Difficult     SHIM Total Score   SHIM 23        Score: 1-7 Severe ED 8-11 Moderate ED 12-16 Mild-Moderate ED 17-21 Mild ED 22-25 No ED  BPH WITH LUTS His IPSS score today is 2, which is mild lower urinary tract symptomatology. He is mostly satisfied with his quality life due to his urinary symptoms.  He denies any dysuria, hematuria or suprapubic pain.   He  also denies any recent fevers, chills, nausea or vomiting.  He does not have a family history of PCa.     IPSS      03/02/16 1600       International Prostate Symptom Score   How often have you had the sensation of not emptying your bladder? Not at All     How often have you had to urinate less than every two hours? Less than 1 in 5 times     How often have you found you stopped and started again several times when you urinated? Not at All     How often have you found it difficult to postpone urination? Not at All     How often have you had a weak urinary stream? Not at All     How often have you had to strain to start urination? Not at All     How many times did you typically get up at night to urinate? 1 Time     Total IPSS Score 2     Quality of Life due to urinary symptoms   If you were to spend the rest of your life with your urinary condition just the way it  is now how would you feel about that? Mostly Satisfied        Score:  1-7 Mild 8-19 Moderate 20-35 Severe  PMH: Past Medical History  Diagnosis Date  . Right knee DJD   . Hypertension     no meds  . Hypogonadism in male   . Heartburn   . Arthritis   . HLD (hyperlipidemia)     Surgical History: Past Surgical History  Procedure Laterality Date  . Knee arthroscopy w/ meniscectomy  2008    left  . Knee arthroscopy w/ meniscectomy  2011    right  . Partial knee arthroplasty  11/29/2012    Procedure: UNICOMPARTMENTAL KNEE;  Surgeon: Lorn Junes, MD;  Location: [place];  Service: Orthopedics;  Laterality: Right;  . Biceps tendon repair  05/2015    Home Medications:    Medication List       This list is accurate as of: 03/02/16  4:26 PM.  Always use your most recent med list.               atorvastatin 10 MG tablet  Commonly known as:  LIPITOR  Take 1 tablet (10 mg total) by mouth daily at 6 PM.     citalopram 20 MG tablet  Commonly known as:  CELEXA  Take 1 tablet (20 mg total) by mouth daily. NEED TO SCHEDULE ANNUAL PHYSICAL FOR FURTHER REFILLS     clomiPHENE 50 MG tablet  Commonly known as:  CLOMID  Take 1/2 tablet daily     nortriptyline 10 MG capsule  Commonly known as:  PAMELOR  TAKE 2 CAPSULES (20 MG TOTAL) BY MOUTH AT BEDTIME.     omeprazole 20 MG capsule  Commonly known as:  PRILOSEC  Take 1 capsule (20 mg total) by mouth daily.     sildenafil 20 MG tablet  Commonly known as:  REVATIO  Take 2-5 tablets as needed to obtain desired effect     testosterone cypionate 200 MG/ML injection  Commonly known as:  DEPOTESTOSTERONE CYPIONATE  Inject 1 mL (200 mg total) into the muscle every 14 (fourteen) days.     VIAGRA 25 MG tablet  Generic drug:  sildenafil        Allergies: No Known Allergies  Family  History: Family History  Problem Relation Age of Onset  . Heart attack Mother   . Hypertension Mother   . Hypertension Father   .  Hypertension Brother   . Heart attack Father   . Kidney disease Neg Hx   . Prostate cancer Neg Hx     Social History:  reports that he has never smoked. He has never used smokeless tobacco. He reports that he drinks about 1.2 oz of alcohol per week. He reports that he does not use illicit drugs.  ROS: UROLOGY Frequent Urination?: No Hard to postpone urination?: No Burning/pain with urination?: No Get up at night to urinate?: No Leakage of urine?: No Urine stream starts and stops?: No Trouble starting stream?: No Do you have to strain to urinate?: No Blood in urine?: No Urinary tract infection?: No Sexually transmitted disease?: No Injury to kidneys or bladder?: No Painful intercourse?: No Weak stream?: No Erection problems?: No Penile pain?: No  Gastrointestinal Nausea?: No Vomiting?: No Indigestion/heartburn?: No Diarrhea?: No Constipation?: No  Constitutional Fever: No Night sweats?: No Weight loss?: No Fatigue?: No  Skin Skin rash/lesions?: No Itching?: No  Eyes Blurred vision?: No Double vision?: No  Ears/Nose/Throat Sore throat?: No Sinus problems?: No  Hematologic/Lymphatic Swollen glands?: No Easy bruising?: No  Cardiovascular Leg swelling?: No Chest pain?: No  Respiratory Cough?: No Shortness of breath?: No  Endocrine Excessive thirst?: No  Musculoskeletal Back pain?: No Joint pain?: No  Neurological Headaches?: No Dizziness?: No  Psychologic Depression?: No Anxiety?: No  Physical Exam: BP 150/95 mmHg  Pulse 71  Ht 5\' 11"  (1.803 m)  Wt 276 lb 3.2 oz (125.283 kg)  BMI 38.54 kg/m2  Constitutional: Well nourished. Alert and oriented, No acute distress. HEENT: [place] AT, moist mucus membranes. Trachea midline, no masses. Cardiovascular: No clubbing, cyanosis, or edema. Respiratory: Normal respiratory effort, no increased work of breathing. GI: Abdomen is soft, non tender, non distended, no abdominal masses. Liver and spleen  not palpable.  No hernias appreciated.  Stool sample for occult testing is not indicated.   GU: No CVA tenderness.  No bladder fullness or masses.  Patient with circumcised  phallus.   Urethral meatus is patent.  No penile discharge. No penile lesions or rashes. Scrotum without lesions, cysts, rashes and/or edema.  Testicles are located scrotally bilaterally. No masses are appreciated in the testicles. Left and right epididymis are normal. Rectal: Patient with  normal sphincter tone. Anus and perineum without scarring or rashes. No rectal masses are appreciated. Prostate is approximately 50 grams, no nodules are appreciated. Seminal vesicles are normal. Skin: No rashes, bruises or suspicious lesions. Lymph: No cervical or inguinal adenopathy. Neurologic: Grossly intact, no focal deficits, moving all 4 extremities. Psychiatric: Normal mood and affect.  Laboratory Data: Lab Results  Component Value Date   WBC 9.0 11/24/2015   HGB 15.5 11/24/2015   HCT 47.4 11/24/2015   MCV 81.9 11/24/2015   PLT 320.0 11/24/2015   Lab Results  Component Value Date   CREATININE 1.24 11/24/2015   Lab Results  Component Value Date   PSA 2.24 11/14/2012   Lab Results  Component Value Date   TESTOSTERONE 240.98* 02/09/2016   Lab Results  Component Value Date   HGBA1C 6.2 11/24/2015      Component Value Date/Time   CHOL 171 02/09/2016 0913   HDL 34.60* 02/09/2016 0913   CHOLHDL 5 02/09/2016 0913   VLDL 28.0 02/09/2016 0913   LDLCALC 108* 02/09/2016 0913   Lab Results  Component Value Date   AST 22 11/24/2015   Lab Results  Component Value Date   ALT 27 11/24/2015     Assessment & Plan:    1. Hypogonadism in male:   I reiterated with the patient the side effects of testosterone therapy, such as: enlargement of the prostate gland that may in turn cause LUTS, possible increased risk of PCa, DVT's and/or PE's, possible increased risk of heart attack or stroke, lower sperm count, swelling of  the ankles, feet, or body, with or without heart failure, enlarged or painful breasts, have problems breathing while you sleep (sleep apnea), increased prostate specific antigen, mood swings, hypertension and increased red blood cell count.  He is not interested in a topical formula due to having two young sons at home and the risk of transference.    I also discussed that some men have had success using clomid for hypogonadism.  It does seem to be more successful in younger men, but there are incidences of good results in middle-aged men.  I explained that it is used in male infertility to stimulate the testicles to make more testosterone/sperm.  There has been no long term data on side effects, but some urologists has been having success with this medication.   He is interested in starting Clomid at this time.  His LFT's are normal.  I have sent a prescription in for the Clomid 50 mg, 1/2 tablet daily, #30 tablets with three refills.    He will return in one month before 9 AM for his a serum testosterone draw.  - Estradiol  2. Erectile dysfunction:   SHIM score is 23.  He is having good results with sildenafil.  We will continue to monitor as testosterone therapy can affect erections.    - TSH  3. BPH with LUTS:   IPSS score is 2/2.  We will continue to monitor as testosterone therapy can cause an increase in prostate size and a worsening of LUTS.  He also has a history of a PSA of 2.24 ng/mL in 2013.  This is high for a man in his age group.  I will recheck the PSA today.  If it returns at 2.5 or higher, we will need to pursue a biopsy if he wants to continue testosterone therapy.    - PSA  Return in about 1 month (around 04/02/2016) for morning serum testosterone before 9 AM.  These notes generated with voice recognition software. I apologize for typographical errors.  Zara Council, [facility] [address] [place], [place] [address] [phone]

## 2016-03-03 DIAGNOSIS — N401 Enlarged prostate with lower urinary tract symptoms: Secondary | ICD-10-CM

## 2016-03-03 DIAGNOSIS — N529 Male erectile dysfunction, unspecified: Secondary | ICD-10-CM | POA: Insufficient documentation

## 2016-03-03 DIAGNOSIS — E291 Testicular hypofunction: Secondary | ICD-10-CM | POA: Insufficient documentation

## 2016-03-03 DIAGNOSIS — N138 Other obstructive and reflux uropathy: Secondary | ICD-10-CM | POA: Insufficient documentation

## 2016-03-03 LAB — TSH: TSH: 5.61 u[IU]/mL — ABNORMAL HIGH (ref 0.450–4.500)

## 2016-03-03 LAB — PSA: Prostate Specific Ag, Serum: 1.6 ng/mL (ref 0.0–4.0)

## 2016-03-03 LAB — ESTRADIOL: ESTRADIOL: 25.7 pg/mL (ref 7.6–42.6)

## 2016-03-06 ENCOUNTER — Telehealth: Payer: Self-pay

## 2016-03-06 NOTE — Telephone Encounter (Signed)
-----   Message from Nori Riis, PA-C sent at 03/03/2016 10:10 AM EDT ----- Would you add a FSH/LH and prolactin level to his blood work?

## 2016-03-06 NOTE — Telephone Encounter (Signed)
Spoke with Tabitha at [facility] and test were added.

## 2016-03-07 ENCOUNTER — Telehealth: Payer: Self-pay | Admitting: Urology

## 2016-03-07 ENCOUNTER — Other Ambulatory Visit: Payer: Self-pay | Admitting: Urology

## 2016-03-07 DIAGNOSIS — E221 Hyperprolactinemia: Secondary | ICD-10-CM

## 2016-03-07 NOTE — Telephone Encounter (Signed)
-----   Message from [place]el sent at 03/07/2016  2:36 PM EDT -----   ----- Message -----    From: Nori Riis, PA-C    Sent: 03/07/2016   8:04 AM      To: Orlene Erm, CMA  I left word with the patient to call me back regarding his labs.  They indicate that he may have a problem with his pituitary gland.  We will have to obtain an MRI of the gland.  He had a CT in 2015, but this would not pick up abnormalities with the pituitary gland.  I haven't placed the order.  I he has questions, I will be gland to speak with him.

## 2016-03-07 NOTE — Telephone Encounter (Signed)
Spoke with pt who stated he has previously spoken with Larene Beach and Sharyn Lull and everything has been taken care of.

## 2016-03-07 NOTE — Telephone Encounter (Signed)
I have gotten this approved and I called the patient and gave him the number to call to get this schd. They are out atleast 2-3 weeks with MRI's but told him to see if they can get him in before the 1st.   Sharyn Lull

## 2016-03-08 LAB — SPECIMEN STATUS REPORT

## 2016-03-08 LAB — FSH/LH

## 2016-03-08 LAB — PROLACTIN: Prolactin: 24.8 ng/mL — ABNORMAL HIGH (ref 4.0–15.2)

## 2016-03-09 ENCOUNTER — Ambulatory Visit (HOSPITAL_COMMUNITY)
Admission: RE | Admit: 2016-03-09 | Discharge: 2016-03-09 | Disposition: A | Discharge status: Home / Self Care | Payer: BLUE CROSS/BLUE SHIELD | Source: Ambulatory Visit | Attending: Urology | Admitting: Urology

## 2016-03-09 DIAGNOSIS — E221 Hyperprolactinemia: Secondary | ICD-10-CM | POA: Diagnosis not present

## 2016-03-09 DIAGNOSIS — I739 Peripheral vascular disease, unspecified: Secondary | ICD-10-CM | POA: Diagnosis not present

## 2016-03-09 LAB — CREATININE, SERUM
Creatinine, Ser: 1.28 mg/dL — ABNORMAL HIGH (ref 0.61–1.24)
GFR calc non Af Amer: >60 mL/min (ref >60)

## 2016-03-09 MED ORDER — GADOBENATE DIMEGLUMINE 529 MG/ML IV SOLN
10.0000 mL | Freq: Once | INTRAVENOUS | Status: AC | PRN
  Administered 2016-03-09: 10 mL via INTRAVENOUS

## 2016-03-11 ENCOUNTER — Other Ambulatory Visit: Payer: Self-pay | Admitting: Internal Medicine

## 2016-03-14 NOTE — Telephone Encounter (Signed)
Ok to refill if he wants

## 2016-03-14 NOTE — Telephone Encounter (Signed)
This has not been filled since 08/2015---I sent mychart msg to pt to see if he requested refill- if pt wants refill please advise if okay to refill--last CPE OV 11/2015

## 2016-04-04 ENCOUNTER — Other Ambulatory Visit: Payer: BLUE CROSS/BLUE SHIELD

## 2016-04-05 ENCOUNTER — Other Ambulatory Visit: Payer: BLUE CROSS/BLUE SHIELD

## 2016-04-05 DIAGNOSIS — E291 Testicular hypofunction: Secondary | ICD-10-CM

## 2016-04-06 LAB — TESTOSTERONE: TESTOSTERONE: 340 ng/dL — AB (ref 348–1197)

## 2016-04-10 ENCOUNTER — Telehealth: Payer: Self-pay

## 2016-04-10 DIAGNOSIS — E291 Testicular hypofunction: Secondary | ICD-10-CM

## 2016-04-10 NOTE — Telephone Encounter (Signed)
-----   Message from Nori Riis, PA-C sent at 04/06/2016  8:46 PM EDT ----- His testosterone level is not therapeutic at this time. I recommend aching a whole tablet of Clomid and repeating his morning testosterone before 9 AM and 1 month.

## 2016-04-10 NOTE — Telephone Encounter (Signed)
Mailbox full

## 2016-04-11 NOTE — Telephone Encounter (Signed)
LMOM

## 2016-04-13 NOTE — Telephone Encounter (Signed)
LMOM

## 2016-04-13 NOTE — Telephone Encounter (Signed)
Spoke with pt in reference to testosterone results. Pt voiced understanding. Lab appt made. Orders placed.

## 2016-04-23 ENCOUNTER — Other Ambulatory Visit: Payer: Self-pay | Admitting: Internal Medicine

## 2016-05-15 ENCOUNTER — Other Ambulatory Visit: Payer: BLUE CROSS/BLUE SHIELD

## 2016-05-15 DIAGNOSIS — E291 Testicular hypofunction: Secondary | ICD-10-CM

## 2016-05-16 ENCOUNTER — Telehealth: Payer: Self-pay

## 2016-05-16 DIAGNOSIS — N400 Enlarged prostate without lower urinary tract symptoms: Secondary | ICD-10-CM

## 2016-05-16 DIAGNOSIS — E291 Testicular hypofunction: Secondary | ICD-10-CM

## 2016-05-16 LAB — TESTOSTERONE: TESTOSTERONE: 480 ng/dL (ref 348–1197)

## 2016-05-16 NOTE — Telephone Encounter (Signed)
Spoke with pt in reference to lab results. Pt was transferred to the front to make f/u appts. Orders placed.

## 2016-05-16 NOTE — Telephone Encounter (Signed)
LMOM

## 2016-05-16 NOTE — Telephone Encounter (Signed)
-----   Message from Nori Riis, PA-C sent at 05/16/2016  7:57 AM EDT ----- Patient's testosterone is normal.  I would like to see him in 6 months.  He needs a testosterone before 10 AM, HCT and PSA.

## 2016-08-04 ENCOUNTER — Ambulatory Visit (INDEPENDENT_AMBULATORY_CARE_PROVIDER_SITE_OTHER): Payer: BLUE CROSS/BLUE SHIELD | Admitting: Internal Medicine

## 2016-08-04 ENCOUNTER — Encounter: Payer: Self-pay | Admitting: Internal Medicine

## 2016-08-04 VITALS — BP 158/100 | HR 78 | Temp 98.5°F | Wt 271.0 lb

## 2016-08-04 DIAGNOSIS — J029 Acute pharyngitis, unspecified: Secondary | ICD-10-CM | POA: Diagnosis not present

## 2016-08-04 DIAGNOSIS — I10 Essential (primary) hypertension: Secondary | ICD-10-CM | POA: Diagnosis not present

## 2016-08-04 MED ORDER — LISINOPRIL-HYDROCHLOROTHIAZIDE 10-12.5 MG PO TABS: 1.0000 | ORAL_TABLET | Freq: Every day | ORAL | 1 refills | Status: DC

## 2016-08-04 NOTE — Patient Instructions (Signed)
Hypertension Hypertension, commonly called high blood pressure, is when the force of blood pumping through your arteries is too strong. Your arteries are the blood vessels that carry blood from your heart throughout your body. A blood pressure reading consists of a higher number over a lower number, such as 110/72. The higher number (systolic) is the pressure inside your arteries when your heart pumps. The lower number (diastolic) is the pressure inside your arteries when your heart relaxes. Ideally you want your blood pressure below 120/80. Hypertension forces your heart to work harder to pump blood. Your arteries may become narrow or stiff. Having untreated or uncontrolled hypertension can cause heart attack, stroke, kidney disease, and other problems. RISK FACTORS Some risk factors for high blood pressure are controllable. Others are not.  Risk factors you cannot control include:   Race. You may be at higher risk if you are African American.  Age. Risk increases with age.  Gender. Men are at higher risk than women before age 45 years. After age 65, women are at higher risk than men. Risk factors you can control include:  Not getting enough exercise or physical activity.  Being overweight.  Getting too much fat, sugar, calories, or salt in your diet.  Drinking too much alcohol. SIGNS AND SYMPTOMS Hypertension does not usually cause signs or symptoms. Extremely high blood pressure (hypertensive crisis) may cause headache, anxiety, shortness of breath, and nosebleed. DIAGNOSIS To check if you have hypertension, your health care provider will measure your blood pressure while you are seated, with your arm held at the level of your heart. It should be measured at least twice using the same arm. Certain conditions can cause a difference in blood pressure between your right and left arms. A blood pressure reading that is higher than normal on one occasion does not mean that you need treatment. If  it is not clear whether you have high blood pressure, you may be asked to return on a different day to have your blood pressure checked again. Or, you may be asked to monitor your blood pressure at home for 1 or more weeks. TREATMENT Treating high blood pressure includes making lifestyle changes and possibly taking medicine. Living a healthy lifestyle can help lower high blood pressure. You may need to change some of your habits. Lifestyle changes may include:  Following the DASH diet. This diet is high in fruits, vegetables, and whole grains. It is low in salt, red meat, and added sugars.  Keep your sodium intake below 2,300 mg per day.  Getting at least 30-45 minutes of aerobic exercise at least 4 times per week.  Losing weight if necessary.  Not smoking.  Limiting alcoholic beverages.  Learning ways to reduce stress. Your health care provider may prescribe medicine if lifestyle changes are not enough to get your blood pressure under control, and if one of the following is true:  You are 18-59 years of age and your systolic blood pressure is above 140.  You are 60 years of age or older, and your systolic blood pressure is above 150.  Your diastolic blood pressure is above 90.  You have diabetes, and your systolic blood pressure is over 140 or your diastolic blood pressure is over 90.  You have kidney disease and your blood pressure is above 140/90.  You have heart disease and your blood pressure is above 140/90. Your personal target blood pressure may vary depending on your medical conditions, your age, and other factors. HOME CARE INSTRUCTIONS    Have your blood pressure rechecked as directed by your health care provider.   Take medicines only as directed by your health care provider. Follow the directions carefully. Blood pressure medicines must be taken as prescribed. The medicine does not work as well when you skip doses. Skipping doses also puts you at risk for  problems.  Do not smoke.   Monitor your blood pressure at home as directed by your health care provider. SEEK MEDICAL CARE IF:   You think you are having a reaction to medicines taken.  You have recurrent headaches or feel dizzy.  You have swelling in your ankles.  You have trouble with your vision. SEEK IMMEDIATE MEDICAL CARE IF:  You develop a severe headache or confusion.  You have unusual weakness, numbness, or feel faint.  You have severe chest or abdominal pain.  You vomit repeatedly.  You have trouble breathing. MAKE SURE YOU:   Understand these instructions.  Will watch your condition.  Will get help right away if you are not doing well or get worse.   This information is not intended to replace advice given to you by your health care provider. Make sure you discuss any questions you have with your health care provider.   Document Released: 11/27/2005 Document Revised: 04/13/2015 Document Reviewed: 09/19/2013 Elsevier Interactive Patient Education 2016 Elsevier Inc.  

## 2016-08-04 NOTE — Progress Notes (Signed)
Pre visit review using our clinic review tool, if applicable. No additional management support is needed unless otherwise documented below in the visit note. 

## 2016-08-04 NOTE — Progress Notes (Signed)
Subjective:    Patient ID: Kenneth Sherman, male    DOB: Jan 04, 1971, 45 y.o.   MRN: [id number]  HPI  Pt presents to the clinic today with c/o scratchy throat. He reports this started 1-2 days ago. He denies difficulty swallowing. It seems worse when he leans forward. At that time, he feels like something is in his throat, and he gags. He denies runny nose, nasal congestion, ear fullness or cough. He has not tried anything OTC for this.  Of note, his BP today was 158/100. His BP at his last visit was 150/95. He has been having intermittent headaches over the last 2 weeks, none today. He denies chest pain, chest tightness or shortness of breath.  Review of Systems      Past Medical History:  Diagnosis Date  . Arthritis   . Heartburn   . HLD (hyperlipidemia)   . Hypertension    no meds  . Hypogonadism in male   . Right knee DJD     Current Outpatient Prescriptions  Medication Sig Dispense Refill  . atorvastatin (LIPITOR) 10 MG tablet Take 1 tablet (10 mg total) by mouth daily at 6 PM. 30 tablet 5  . citalopram (CELEXA) 20 MG tablet Take 1 tablet (20 mg total) by mouth daily. 30 tablet 5  . clomiPHENE (CLOMID) 50 MG tablet Take 1/2 tablet daily 30 tablet 3  . nortriptyline (PAMELOR) 10 MG capsule TAKE 2 CAPSULES (20 MG TOTAL) BY MOUTH AT BEDTIME. 30 capsule 5  . omeprazole (PRILOSEC) 20 MG capsule TAKE 1 CAPSULE (20 MG TOTAL) BY MOUTH DAILY. 30 capsule 5  . lisinopril-hydrochlorothiazide (PRINZIDE,ZESTORETIC) 10-12.5 MG tablet Take 1 tablet by mouth daily. 30 tablet 1   No current facility-administered medications for this visit.     No Known Allergies  Family History  Problem Relation Age of Onset  . Heart attack Mother   . Hypertension Mother   . Hypertension Father   . Heart attack Father   . Hypertension Brother   . Kidney disease Neg Hx   . Prostate cancer Neg Hx     Social History   Social History  . Marital status: Married    Spouse name: N/A  . Number of  children: N/A  . Years of education: 31   Occupational History  . CAD Designer [employer]   Social History Main Topics  . Smoking status: Never Smoker  . Smokeless tobacco: Never Used  . Alcohol use 1.2 oz/week    1 Glasses of wine, 1 Cans of beer per week     Comment: occasionally  . Drug use: No  . Sexual activity: Yes    Birth control/ protection: Condom   Other Topics Concern  . Not on file   Social History Narrative   Regular exercise-no   Caffeine Use-yes     Constitutional: Denies fever, malaise, fatigue, headache or abrupt weight changes.  HEENT: Pt reports sore throat. Denies eye pain, eye redness, ear pain, ringing in the ears, wax buildup, runny nose, nasal congestion, bloody nose. Respiratory: Denies difficulty breathing, shortness of breath, cough or sputum production.   Cardiovascular: Denies chest pain, chest tightness, palpitations or swelling in the hands or feet.  Neurological: Denies dizziness, difficulty with memory, difficulty with speech or problems with balance and coordination.   No other specific complaints in a complete review of systems (except as listed in HPI above).  Objective:   Physical Exam   BP (!) 158/100   Pulse 78  Temp 98.5 F (36.9 C) (Oral)   Wt 271 lb (122.9 kg)   SpO2 98%   BMI 37.80 kg/m  Wt Readings from Last 3 Encounters:  08/04/16 271 lb (122.9 kg)  03/02/16 276 lb 3.2 oz (125.3 kg)  11/24/15 261 lb (118.4 kg)    General: Appears his stated age, obese in NAD. HEENT: Head: normal shape and size; Throat/Mouth: Teeth present, mucosa pink and moist, no exudate, lesions or ulcerations noted.  Neck:  No adenopathy noted. Cardiovascular: Normal rate and rhythm. S1,S2 noted.  No murmur, rubs or gallops noted.  Pulmonary/Chest: Normal effort and positive vesicular breath sounds. No respiratory distress. No wheezes, rales or ronchi noted.  Neurological: Alert and oriented.   BMET    Component Value Date/Time   NA 139  11/24/2015 1553   K 4.1 11/24/2015 1553   CL 101 11/24/2015 1553   CO2 32 11/24/2015 1553   GLUCOSE 97 11/24/2015 1553   BUN 11 11/24/2015 1553   CREATININE 1.28 (H) 03/09/2016 1621   CALCIUM 9.4 11/24/2015 1553   GFRNONAA >60 03/09/2016 1621   GFRAA >60 03/09/2016 1621    Lipid Panel     Component Value Date/Time   CHOL 171 02/09/2016 0913   TRIG 140.0 02/09/2016 0913   HDL 34.60 (L) 02/09/2016 0913   CHOLHDL 5 02/09/2016 0913   VLDL 28.0 02/09/2016 0913   LDLCALC 108 (H) 02/09/2016 0913    CBC    Component Value Date/Time   WBC 9.0 11/24/2015 1553   RBC 5.78 11/24/2015 1553   HGB 15.5 11/24/2015 1553   HCT 47.4 11/24/2015 1553   PLT 320.0 11/24/2015 1553   MCV 81.9 11/24/2015 1553   MCH 28.5 12/02/2012 2009   MCHC 32.8 11/24/2015 1553   RDW 14.7 11/24/2015 1553   LYMPHSABS 0.7 12/02/2012 2009   MONOABS 0.5 12/02/2012 2009   EOSABS 0.0 12/02/2012 2009   BASOSABS 0.0 12/02/2012 2009    Hgb A1C Lab Results  Component Value Date   HGBA1C 6.2 11/24/2015           Assessment & Plan:   Sore throat:  Exam benign Try Zyrtec 10 mg daily Let me know if it does not improve  HTN:  eRx for Lisinopril-HCT 10-12.5 mg Encouraged weight loss and consume a low salt diet  RTC in 3 weeks to follow up BP Kato Wieczorek, NP

## 2016-08-05 LAB — BASIC METABOLIC PANEL
BUN: 11 mg/dL (ref 7–25)
CO2: 27 mmol/L (ref 20–31)
Calcium: 9.1 mg/dL (ref 8.6–10.3)
Chloride: 104 mmol/L (ref 98–110)
Creat: 1.22 mg/dL (ref 0.60–1.35)
Glucose, Bld: 111 mg/dL — ABNORMAL HIGH (ref 65–99)
POTASSIUM: 4.6 mmol/L (ref 3.5–5.3)
SODIUM: 144 mmol/L (ref 135–146)

## 2016-08-05 LAB — TSH: TSH: 5.57 mIU/L — ABNORMAL HIGH (ref 0.40–4.50)

## 2016-08-07 ENCOUNTER — Encounter: Payer: Self-pay | Admitting: Internal Medicine

## 2016-08-08 ENCOUNTER — Other Ambulatory Visit: Payer: Self-pay | Admitting: Internal Medicine

## 2016-08-08 MED ORDER — LEVOTHYROXINE SODIUM 25 MCG PO TABS: 25.0000 ug | ORAL_TABLET | Freq: Every day | ORAL | 1 refills | Status: DC

## 2016-08-14 ENCOUNTER — Other Ambulatory Visit: Payer: Self-pay | Admitting: Internal Medicine

## 2016-08-25 ENCOUNTER — Encounter: Payer: Self-pay | Admitting: Internal Medicine

## 2016-08-25 ENCOUNTER — Ambulatory Visit (INDEPENDENT_AMBULATORY_CARE_PROVIDER_SITE_OTHER): Payer: BLUE CROSS/BLUE SHIELD | Admitting: Internal Medicine

## 2016-08-25 VITALS — BP 128/72 | HR 97 | Temp 98.1°F | Wt 268.0 lb

## 2016-08-25 DIAGNOSIS — I10 Essential (primary) hypertension: Secondary | ICD-10-CM | POA: Diagnosis not present

## 2016-08-25 DIAGNOSIS — R51 Headache: Secondary | ICD-10-CM | POA: Diagnosis not present

## 2016-08-25 DIAGNOSIS — R519 Headache, unspecified: Secondary | ICD-10-CM

## 2016-08-25 MED ORDER — NORTRIPTYLINE HCL 50 MG PO CAPS: 50.0000 mg | ORAL_CAPSULE | Freq: Every day | ORAL | 2 refills | Status: DC

## 2016-08-25 MED ORDER — LISINOPRIL-HYDROCHLOROTHIAZIDE 10-12.5 MG PO TABS: 1.0000 | ORAL_TABLET | Freq: Every day | ORAL | 5 refills | Status: DC

## 2016-08-25 NOTE — Progress Notes (Signed)
Subjective:    Patient ID: Kenneth Sherman, male    DOB: 1971-03-01, 45 y.o.   MRN: [id number]  HPI  Pt presents to the clinic today for 3 week follow up of HTN. At his last visit, his BP was 158/100. He was started on Lisinopril-HCTZ. He has been taking the medication as prescribed and denies adverse effects. His BP today is 128/72 . ECG from 11/2012 reviewed.  He also reports ongoing headaches. This started 5 weeks ago. We thought it was related to the high blood pressure, but now the blood pressure is controlled and he continues to have the headaches. They are located in his forehead. He describes the headache as a constant pressure. He describes the pain as a 4-5/10. He denies changes in vision. He has tried Ibuprofen without any relief. He is taking Nortriptyline as prescribed by neurology but does not feel like it is helping any longer.  Review of Systems      Past Medical History:  Diagnosis Date  . Arthritis   . Heartburn   . HLD (hyperlipidemia)   . Hypertension    no meds  . Hypogonadism in male   . Right knee DJD     Current Outpatient Prescriptions  Medication Sig Dispense Refill  . atorvastatin (LIPITOR) 10 MG tablet Take 1 tablet (10 mg total) by mouth daily at 6 PM. 30 tablet 5  . levothyroxine (SYNTHROID, LEVOTHROID) 25 MCG tablet Take 1 tablet (25 mcg total) by mouth daily before breakfast. 30 tablet 1  . lisinopril-hydrochlorothiazide (PRINZIDE,ZESTORETIC) 10-12.5 MG tablet Take 1 tablet by mouth daily. 30 tablet 1  . nortriptyline (PAMELOR) 10 MG capsule TAKE 2 CAPSULES (20 MG TOTAL) BY MOUTH AT BEDTIME. 30 capsule 4  . omeprazole (PRILOSEC) 20 MG capsule TAKE 1 CAPSULE (20 MG TOTAL) BY MOUTH DAILY. 30 capsule 5  . clomiPHENE (CLOMID) 50 MG tablet Take 1/2 tablet daily (Patient taking differently: Take 50 mg by mouth daily. ) 30 tablet 3   No current facility-administered medications for this visit.     No Known Allergies  Family History  Problem Relation Age  of Onset  . Heart attack Mother   . Hypertension Mother   . Hypertension Father   . Heart attack Father   . Hypertension Brother   . Kidney disease Neg Hx   . Prostate cancer Neg Hx     Social History   Social History  . Marital status: Married    Spouse name: N/A  . Number of children: N/A  . Years of education: 59   Occupational History  . 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 Smoking status: Never Smoker  . Smokeless tobacco: Never Used  . Alcohol use 1.2 oz/week    1 Glasses of wine, 1 Cans of beer per week     Comment: occasionally  . Drug use: No  . Sexual activity: Yes    Birth control/ protection: Condom   Other Topics Concern  . Not on file   Social History Narrative   Regular exercise-no   Caffeine Use-yes     Constitutional: Pt reports headache. Denies fever, malaise, fatigue, or abrupt weight changes.  HEENT: Denies eye pain, eye redness, ear pain, ringing in the ears, wax buildup, runny nose, nasal congestion, bloody nose, or sore throat. Respiratory: Denies difficulty breathing, shortness of breath, cough or sputum production.   Cardiovascular: Denies chest pain, chest tightness, palpitations or swelling in the hands or feet.  Neurological: Denies dizziness, difficulty with memory, difficulty with speech or problems with balance and coordination.    No other specific complaints in a complete review of systems (except as listed in HPI above).  Objective:   Physical Exam   BP 128/72   Pulse 97   Temp 98.1 F (36.7 C) (Oral)   Wt 268 lb (121.6 kg)   SpO2 97%   BMI 37.38 kg/m  Wt Readings from Last 3 Encounters:  08/25/16 268 lb (121.6 kg)  08/04/16 271 lb (122.9 kg)  03/02/16 276 lb 3.2 oz (125.3 kg)    General: Appears his stated age, obese in NAD. HEENT: Head: normal shape and size; Eyes: sclera white, no icterus, conjunctiva pink, PERRLA and EOMs intact;  Cardiovascular: Normal rate and rhythm. S1,S2 noted.  No  murmur, rubs or gallops noted. No JVD or BLE edema.  Pulmonary/Chest: Normal effort and positive vesicular breath sounds. No respiratory distress. No wheezes, rales or ronchi noted.  Neurological: Alert and oriented.  Coordination normal.   BMET    Component Value Date/Time   NA 144 08/04/2016 1525   K 4.6 08/04/2016 1525   CL 104 08/04/2016 1525   CO2 27 08/04/2016 1525   GLUCOSE 111 (H) 08/04/2016 1525   BUN 11 08/04/2016 1525   CREATININE 1.22 08/04/2016 1525   CALCIUM 9.1 08/04/2016 1525   GFRNONAA >60 03/09/2016 1621   GFRAA >60 03/09/2016 1621    Lipid Panel     Component Value Date/Time   CHOL 171 02/09/2016 0913   TRIG 140.0 02/09/2016 0913   HDL 34.60 (L) 02/09/2016 0913   CHOLHDL 5 02/09/2016 0913   VLDL 28.0 02/09/2016 0913   LDLCALC 108 (H) 02/09/2016 0913    CBC    Component Value Date/Time   WBC 9.0 11/24/2015 1553   RBC 5.78 11/24/2015 1553   HGB 15.5 11/24/2015 1553   HCT 47.4 11/24/2015 1553   PLT 320.0 11/24/2015 1553   MCV 81.9 11/24/2015 1553   MCH 28.5 12/02/2012 2009   MCHC 32.8 11/24/2015 1553   RDW 14.7 11/24/2015 1553   LYMPHSABS 0.7 12/02/2012 2009   MONOABS 0.5 12/02/2012 2009   EOSABS 0.0 12/02/2012 2009   BASOSABS 0.0 12/02/2012 2009    Hgb A1C Lab Results  Component Value Date   HGBA1C 6.2 11/24/2015       Assessment & Plan:   Headaches:  ? Tension Increase Nortriptyline to 50 mg daily If no improvement, call your neurologist  RTC in 3 months for your annual exam Webb Silversmith, NP

## 2016-08-25 NOTE — Patient Instructions (Signed)

## 2016-08-25 NOTE — Assessment & Plan Note (Signed)
At goal, will continue Lisinopril- HCTZ Will order future BMET to be checked in 2 weeks

## 2016-08-25 NOTE — Progress Notes (Signed)
Subjective:    Patient ID: Kenneth Sherman, male    DOB: [birth year], 45 y.o.   MRN: [id number]  HPI  71yoM with a history of HTN, hypothyroidism, hypogonadism, migraines, and arthritis comes to the office today for a follow up for recently diagnosed HTN, starting Zestoretic 10-12.5mg  3 weeks ago. BP stable today 127/72.  Additionally, he has been having 4-5 weeks of a constant frontal headache, described as a "pressure headache", unrelieved by Ibuprofen 2 tabs per day. Pt was referred approximately 1.66yrs ago to neurology for the same type of headaches and was prescribed nortriptyline 20mg  PO QD with good relief until recently. 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 Plans at that time were to follow up in 6 months  for another scan to evaluate size.            Review of Systems  Past Medical History:  Diagnosis Date  . Arthritis   . Heartburn   . HLD (hyperlipidemia)   . Hypertension    no meds  . Hypogonadism in male   . Right knee DJD     Current Outpatient Prescriptions  Medication Sig Dispense Refill  . atorvastatin (LIPITOR) 10 MG tablet Take 1 tablet (10 mg total) by mouth daily at 6 PM. 30 tablet 5  . levothyroxine (SYNTHROID, LEVOTHROID) 25 MCG tablet Take 1 tablet (25 mcg total) by mouth daily before breakfast. 30 tablet 1  . lisinopril-hydrochlorothiazide (PRINZIDE,ZESTORETIC) 10-12.5 MG tablet Take 1 tablet by mouth daily. 30 tablet 5  . omeprazole (PRILOSEC) 20 MG capsule TAKE 1 CAPSULE (20 MG TOTAL) BY MOUTH DAILY. 30 capsule 5  . clomiPHENE (CLOMID) 50 MG tablet Take 1/2 tablet daily (Patient taking differently: Take 50 mg by mouth daily. ) 30 tablet 3  . nortriptyline (PAMELOR) 50 MG capsule Take 1 capsule (50 mg total) by mouth at bedtime. 30 capsule 2   No current facility-administered medications for this visit.     No Known Allergies  Family History  Problem Relation Age of Onset  . Heart attack Mother   .  Hypertension Mother   . Hypertension Father   . Heart attack Father   . Hypertension Brother   . Kidney disease Neg Hx   . Prostate cancer Neg Hx     Social History   Social History  . Marital status: Married    Spouse name: N/A  . Number of children: N/A  . Years of education: 45   Occupational History  . 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 Smoking status: Never Smoker  . Smokeless tobacco: Never Used  . Alcohol use 1.2 oz/week    1 Glasses of wine, 1 Cans of beer per week     Comment: occasionally  . Drug use: No  . Sexual activity: Yes    Birth control/ protection: Condom   Other Topics Concern  . Not on file   Social History Narrative   Regular exercise-no   Caffeine Use-yes     Constitutional: Denies fever, malaise, fatigue or abrupt weight changes. Admits to ongoing frontal headaches HEENT: Denies eye pain, eye redness, ear pain, ringing in the ears, wax buildup, runny nose, nasal congestion, bloody nose, or sore throat. Respiratory: Denies difficulty breathing, shortness of breath, cough or sputum production.   Cardiovascular: Denies chest pain, chest tightness, palpitations or swelling in the hands or feet.  Gastrointestinal: Denies abdominal pain, bloating, constipation, diarrhea or blood  in the stool.  GU: Denies urgency, frequency, pain with urination, burning sensation, blood in urine, odor or discharge. Musculoskeletal: Denies decrease in range of motion, difficulty with gait, muscle pain or joint pain and swelling.  Skin: Denies redness, rashes, lesions or ulcercations.  Neurological: Denies dizziness, difficulty with memory, difficulty with speech or problems with balance and coordination.  Psych: Denies anxiety, depression, SI/HI.  No other specific complaints in a complete review of systems (except as listed in HPI above).     Objective:   Physical Exam  BP 128/72   Pulse 97   Temp 98.1 F (36.7 C) (Oral)   Wt 268 lb  (121.6 kg)   SpO2 97%   BMI 37.38 kg/m  Wt Readings from Last 3 Encounters:  08/25/16 268 lb (121.6 kg)  08/04/16 271 lb (122.9 kg)  03/02/16 276 lb 3.2 oz (125.3 kg)    General: Appears his stated age, well developed, well nourished in NAD. HEENT: Head: normal shape and size; Eyes: sclera white, no icterus, conjunctiva pink, PERRLA and EOMs intact; Ears: Tm's gray and intact, normal light reflex; Nose: mucosa pink and moist, septum midline; Throat/Mouth: Teeth present, mucosa pink and moist, no exudate, lesions or ulcerations noted.  Cardiovascular: Normal rate and rhythm. S1,S2 noted.  No murmur, rubs or gallops noted. No JVD or BLE edema. No carotid bruits noted. Pulmonary/Chest: Normal effort and positive vesicular breath sounds. No respiratory distress. No wheezes, rales or ronchi noted.  Abdomen: Soft and nontender. Normal bowel sounds. No distention or masses noted. Liver, spleen and kidneys non palpable. Musculoskeletal: Normal range of motion. No signs of joint swelling. No difficulty with gait.  Neurological: Alert and oriented. Cranial nerves II-XII grossly intact. Coordination normal.  Psychiatric: Mood and affect normal. Behavior is normal. Judgment and thought content normal.   EKG:  BMET    Component Value Date/Time   NA 144 08/04/2016 1525   K 4.6 08/04/2016 1525   CL 104 08/04/2016 1525   CO2 27 08/04/2016 1525   GLUCOSE 111 (H) 08/04/2016 1525   BUN 11 08/04/2016 1525   CREATININE 1.22 08/04/2016 1525   CALCIUM 9.1 08/04/2016 1525   GFRNONAA >60 03/09/2016 1621   GFRAA >60 03/09/2016 1621    Lipid Panel     Component Value Date/Time   CHOL 171 02/09/2016 0913   TRIG 140.0 02/09/2016 0913   HDL 34.60 (L) 02/09/2016 0913   CHOLHDL 5 02/09/2016 0913   VLDL 28.0 02/09/2016 0913   LDLCALC 108 (H) 02/09/2016 0913    CBC    Component Value Date/Time   WBC 9.0 11/24/2015 1553   RBC 5.78 11/24/2015 1553   HGB 15.5 11/24/2015 1553   HCT 47.4 11/24/2015  1553   PLT 320.0 11/24/2015 1553   MCV 81.9 11/24/2015 1553   MCH 28.5 12/02/2012 2009   MCHC 32.8 11/24/2015 1553   RDW 14.7 11/24/2015 1553   LYMPHSABS 0.7 12/02/2012 2009   MONOABS 0.5 12/02/2012 2009   EOSABS 0.0 12/02/2012 2009   BASOSABS 0.0 12/02/2012 2009    Hgb A1C Lab Results  Component Value Date   HGBA1C 6.2 11/24/2015            Assessment & Plan:  Assessment:  -HTN -Headaches   Plan:  -Continue Zestoretic 10-12.5mg  PO QD for HTN. BP controlled. Will assess BP at next visit.  -Will increase nortriptyline dose from 20mg  to 50mg  PO QHS for increased frequency of headaches. Encouraged to follow up for the 6 month appointment  for cyst assessment. Pt is to follow up for a lab appointment with [place] in 3 weeks, to call if headaches have worsened or have not become better at that point. May need to refer back to neurology.

## 2016-09-01 ENCOUNTER — Telehealth: Payer: Self-pay | Admitting: Urology

## 2016-09-01 DIAGNOSIS — E291 Testicular hypofunction: Secondary | ICD-10-CM

## 2016-09-01 MED ORDER — CLOMIPHENE CITRATE 50 MG PO TABS: 50.0000 mg | ORAL_TABLET | Freq: Every day | ORAL | 3 refills | Status: DC

## 2016-09-01 NOTE — Telephone Encounter (Signed)
LMOM- new script sent to pharmacy to reflect one whole tablet daily

## 2016-09-01 NOTE — Telephone Encounter (Signed)
Pt would like his Clomid RX to take 1 tablet per day.  Right now it is still 1/2 day.  He needs to have this changed.  He uses [facility] on [address]  He is almost out.  Please call pt and let him know that this has been done.  They are questioning him about dosage at pharmacy.  [phone]

## 2016-09-05 ENCOUNTER — Other Ambulatory Visit: Payer: Self-pay | Admitting: Internal Medicine

## 2016-09-07 ENCOUNTER — Other Ambulatory Visit: Payer: Self-pay | Admitting: Urology

## 2016-09-07 DIAGNOSIS — D352 Benign neoplasm of pituitary gland: Secondary | ICD-10-CM

## 2016-09-07 NOTE — Progress Notes (Signed)
It's time for the patient to have his follow up MRI.  I have placed the orders.

## 2016-09-18 ENCOUNTER — Other Ambulatory Visit (INDEPENDENT_AMBULATORY_CARE_PROVIDER_SITE_OTHER): Payer: BLUE CROSS/BLUE SHIELD

## 2016-09-18 DIAGNOSIS — R7303 Prediabetes: Secondary | ICD-10-CM

## 2016-09-18 DIAGNOSIS — I10 Essential (primary) hypertension: Secondary | ICD-10-CM

## 2016-09-18 LAB — T4, FREE: Free T4: 0.65 ng/dL (ref 0.60–1.60)

## 2016-09-18 LAB — HEMOGLOBIN A1C: Hgb A1c MFr Bld: 6.3 % (ref 4.6–6.5)

## 2016-09-18 LAB — BASIC METABOLIC PANEL
BUN: 14 mg/dL (ref 6–23)
CALCIUM: 8.8 mg/dL (ref 8.4–10.5)
CO2: 29 meq/L (ref 19–32)
CREATININE: 1.19 mg/dL (ref 0.40–1.50)
Chloride: 108 mEq/L (ref 96–112)
GFR: 69.9 mL/min (ref >60.00)
GLUCOSE: 111 mg/dL — AB (ref 70–99)
Potassium: 4.4 mEq/L (ref 3.5–5.1)
SODIUM: 141 meq/L (ref 135–145)

## 2016-09-18 LAB — TSH: TSH: 4.56 u[IU]/mL — ABNORMAL HIGH (ref 0.35–4.50)

## 2016-09-21 ENCOUNTER — Encounter: Payer: Self-pay | Admitting: Internal Medicine

## 2016-09-27 ENCOUNTER — Other Ambulatory Visit: Payer: Self-pay | Admitting: Urology

## 2016-09-27 DIAGNOSIS — E291 Testicular hypofunction: Secondary | ICD-10-CM

## 2016-09-27 NOTE — Telephone Encounter (Signed)
Patient is requesting a refill on his Clomid. He is overdue for a repeat MRI of his pituitary gland. This needs to be completed before refill is given.

## 2016-10-02 ENCOUNTER — Encounter: Payer: Self-pay | Admitting: Urology

## 2016-10-02 ENCOUNTER — Other Ambulatory Visit: Payer: Self-pay | Admitting: Urology

## 2016-10-02 MED ORDER — DIAZEPAM 10 MG PO TABS: ORAL_TABLET | ORAL | 0 refills | Status: DC

## 2016-10-02 NOTE — Progress Notes (Signed)
Valium given to take prior to MRI.

## 2016-10-06 ENCOUNTER — Ambulatory Visit
Admission: RE | Admit: 2016-10-06 | Discharge: 2016-10-06 | Disposition: A | Discharge status: Home / Self Care | Payer: BLUE CROSS/BLUE SHIELD | Source: Ambulatory Visit | Attending: Urology | Admitting: Urology

## 2016-10-06 ENCOUNTER — Telehealth: Payer: Self-pay

## 2016-10-06 DIAGNOSIS — D352 Benign neoplasm of pituitary gland: Secondary | ICD-10-CM | POA: Diagnosis not present

## 2016-10-06 MED ORDER — GADOBENATE DIMEGLUMINE 529 MG/ML IV SOLN
10.0000 mL | Freq: Once | INTRAVENOUS | Status: AC | PRN
  Administered 2016-10-06: 10 mL via INTRAVENOUS

## 2016-10-06 NOTE — Telephone Encounter (Signed)
Spoke with pt in reference to MRI results. Pt voiced understanding. Referral placed.

## 2016-10-06 NOTE — Telephone Encounter (Signed)
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  I suggest at this time that we refer the patient to endocrinology for further workup.  These lesions may be benign or they could be functional lesions causing hormonal imbalances. An endocrinologist would be the practitioner to further evaluate his condition.  They are not cancerous.

## 2016-10-06 NOTE — Telephone Encounter (Signed)
LMOM

## 2016-10-11 ENCOUNTER — Ambulatory Visit: Payer: BLUE CROSS/BLUE SHIELD | Admitting: Urology

## 2016-10-18 ENCOUNTER — Ambulatory Visit (INDEPENDENT_AMBULATORY_CARE_PROVIDER_SITE_OTHER): Payer: BLUE CROSS/BLUE SHIELD | Admitting: Endocrinology

## 2016-10-18 ENCOUNTER — Encounter: Payer: Self-pay | Admitting: Endocrinology

## 2016-10-18 VITALS — BP 142/100 | HR 104 | Ht 71.0 in | Wt 275.0 lb

## 2016-10-18 DIAGNOSIS — E221 Hyperprolactinemia: Secondary | ICD-10-CM | POA: Diagnosis not present

## 2016-10-18 DIAGNOSIS — E230 Hypopituitarism: Secondary | ICD-10-CM | POA: Diagnosis not present

## 2016-10-18 NOTE — Patient Instructions (Signed)
Double Thyroid pills

## 2016-10-18 NOTE — Progress Notes (Signed)
Patient ID: Kenneth Sherman, male   DOB: 04-Feb-1971, 45 y.o.   MRN: [id number]           Chief complaint: Low testosterone and other problems  Referring physician: Garnette Gunner  Reason for consultation: "Prolactinoma"  History of Present Illness  Hypogonadismwas diagnosed in 2014 or so  He has had complaints offatigue and decreased libido initially and was told to have a low testosterone  There is no history of the following: Hot flushes, sweats, breast enlargement, long term anabolic steroid use, history of testicular injury mumps in childhood.No history of osteopenia or low impact fracture  Baseline testosterone levels are not available. He says that he was treated by his primary care physician with testosterone gel for some time and apparently because of lack of efficacy he was started on Depo-Testosterone injections which he did for about a year and a half Was apparently having still relatively low levels on the injections and was referred to the urologist for further management  The urologist started him on clomiphene half tablet daily and since his levels were not normal in April he subsequently taking a whole tablet daily He says within a month of starting the clomiphene he started feeling better with his energy level and is now about 75% better. Also has Prior lab results improved libido. Has not had a testosterone level done since June  More recenttestosterone levels :  Lab Results  Component Value Date   TESTOSTERONE 480 05/15/2016   TESTOSTERONE 340 (L) 04/05/2016   TESTOSTERONE 240.98 (L) 02/09/2016   TESTOSTERONE 207.88 (L) 07/14/2015    Prolactin level: 24.8 done in 3/17  No results found for: [place]  LH level in 3/17 was <0.2  He had an MRI of his pituitary gland because of the high prolactin level and this showed a possible microadenoma but this was atypical location and very small size of 3 mm only. The enhancing areas are unchanged on a recent MRI Other  findings:  Partial empty sella. There is flattening of the gland against the floor due to excess cerebrospinal fluid which is herniated downward. Slight osseous expansion of the sella. The stalk is stretched but is in the midline. No suprasellar masses or chiasmatic compression.  Within the substance of the gland, there is a 3 x 1 x 2 mm (R-L x A-P x C-C) area of differential hypoenhancement in the midline, an atypical location for a prolactinoma.      HYPOTHYROIDISM:  He has been on levothyroxine because of high TSH in March, this has not been changed recently Complains of fatigue and occasional cold intolerance  Lab Results  Component Value Date   TSH 4.56 (H) 09/18/2016   TSH 5.57 (H) 08/04/2016   TSH 5.610 (H) 03/02/2016   FREET4 0.65 09/18/2016       Medication List       Accurate as of 10/18/16  1:56 PM. Always use your most recent med list.          atorvastatin 10 MG tablet Commonly known as:  LIPITOR Take 1 tablet (10 mg total) by mouth daily at 6 PM.   clomiPHENE 50 MG tablet Commonly known as:  CLOMID Take 1 tablet (50 mg total) by mouth daily.   diazepam 10 MG tablet Commonly known as:  VALIUM Take 30 minutes prior to the MRI   levothyroxine 25 MCG tablet Commonly known as:  SYNTHROID, LEVOTHROID TAKE 1 TABLET (25 MCG TOTAL) BY MOUTH DAILY BEFORE BREAKFAST.   lisinopril-hydrochlorothiazide 10-12.5 MG tablet  Commonly known as:  PRINZIDE,ZESTORETIC Take 1 tablet by mouth daily.   nortriptyline 50 MG capsule Commonly known as:  PAMELOR Take 1 capsule (50 mg total) by mouth at bedtime.   omeprazole 20 MG capsule Commonly known as:  PRILOSEC TAKE 1 CAPSULE (20 MG TOTAL) BY MOUTH DAILY.       Allergies: No Known Allergies  Past Medical History:  Diagnosis Date  . Arthritis   . Heartburn   . HLD (hyperlipidemia)   . Hypertension    no meds  . Hypogonadism in male   . Right knee DJD     Past Surgical History:  Procedure  Laterality Date  . BICEPS TENDON REPAIR  05/2015  . KNEE ARTHROSCOPY W/ MENISCECTOMY  2008   left  . KNEE ARTHROSCOPY W/ MENISCECTOMY  2011   right  . PARTIAL KNEE ARTHROPLASTY  11/29/2012   Procedure: UNICOMPARTMENTAL KNEE;  Surgeon: Lorn Junes, MD;  Location: [facility];  Service: Orthopedics;  Laterality: Right;    Family History  Problem Relation Age of Onset  . Heart attack Mother   . Hypertension Mother   . Hypertension Father   . Heart attack Father   . Hypertension Brother   . Kidney disease Neg Hx   . Prostate cancer Neg Hx     Social History:  reports that he has never smoked. He has never used smokeless tobacco. He reports that he drinks about 1.2 oz of alcohol per week . He reports that he does not use drugs.  Review of Systems  HENT: Positive for headaches.        Has headaches in the back of his head for the last year and a half and these are apparently better with nortriptyline  Eyes: Negative for blurred vision.  Respiratory: Negative for daytime sleepiness.   Cardiovascular: Negative for leg swelling.  Gastrointestinal: Negative for abdominal pain.  Endocrine: Positive for decreased libido. Negative for cold intolerance.  Genitourinary: Negative for frequency.  Musculoskeletal: Negative for joint pain.  Skin: Negative for rash.  Neurological: Negative for weakness.  Psychiatric/Behavioral: Negative for depressed mood.     HYPERTENSION:  BP Readings from Last 3 Encounters:  10/18/16 (!) 142/100  08/25/16 128/72  08/04/16 (!) 158/100   OBESITY:  Wt Readings from Last 3 Encounters:  10/18/16 275 lb (124.7 kg)  08/25/16 268 lb (121.6 kg)  08/04/16 271 lb (122.9 kg)   PREDIABETES: His fasting glucose was 111 recently  Lab Results  Component Value Date   HGBA1C 6.3 09/18/2016   HGBA1C 6.2 11/24/2015   HGBA1C 5.9 11/14/2012   Lab Results  Component Value Date   LDLCALC 108 (H) 02/09/2016   CREATININE 1.19 09/18/2016     General Examination:    BP (!) 142/100   Pulse (!) 104   Ht 5\' 11"  (1.803 m)   Wt 275 lb (124.7 kg)   SpO2 97%   BMI 38.35 kg/m   GENERAL APPEARANCE generalized obesity.  SKIN:normal, no rash or pigmentation.  HEENT:Oral mucosa normal.  Relatively small oropharyngeal opening.  EYES:normal external appearance of eyes, fundus exam not indicated NECK:no lymphadenopathy, no thyromegaly.  CHEST: Gynecomastia present, mild bilaterally  LUNGS:clear to auscultation bilaterally, no wheezes, rhonchi, rales.   HEART:normal S1 And S2, no S3, S4, murmur or click.  ABDOMEN:no hepatosplenomegaly, no masses palpated, soft and not tender.   MALE GENITOURINARY: Testicles are normal in size in texture  MUSCULOSKELETALNo enlargement or deformity of joints.  EXTREMITIES:no clubbing, no edema.  NEUROLOGIC EXAM:  Biceps reflexes normal (2+) bilaterally.   Assessment/ Plan:  1. Hypogonadotropic hypogonadism: This is either related to empty sella syndrome or insulin resistance syndrome, may also be a combination of both He appears to be responding to clomiphene with improved subjective symptoms and normal total testosterone level. However since he is taking a relatively high dose of clomiphene and has not had a level since June he needs to have follow-up level along with repeat LH level  2.  Empty sella syndrome: This is likely asymptomatic except for effects on the pituitary function.  He probably does have multiple PITUITARY hormone deficits including secondary hypothyroidism with recent low free T4 level Needs assessment of his IGF-I level also.  3.  Hyperprolactinemia: This may be secondary to stalk compression with his empty sella syndrome but he may also have a small mildly active prolactinoma.  Baseline prolactin is only mildly increased and likely to be insignificant.  This needs to be repeated  4.  Prediabetes: His A1c is 6.3 and his fasting glucose  is 111.  Patient was not aware of this.  Also has gained weight and discussed that he does have prediabetes and needs to significantly work on his weight loss.  May also benefit from consultation with dietitian  5.  HYPOTHYROIDISM:Although he has been told to have mild primary hypothyroidism with mild increase in TSH he probably has also secondary hypothyroidism with his empty sella syndrome. He does have a low normal free T4 currently with his dose of 25 g levothyroxine. Continues to have some fatigue and most likely needs to continue increasing his dose to get therapeutic free T4 level. For now he will increase the dose to 50 g  Consultation note to PCP  [facility] 10/18/2016, 1:56 PM

## 2016-10-19 ENCOUNTER — Other Ambulatory Visit (INDEPENDENT_AMBULATORY_CARE_PROVIDER_SITE_OTHER): Payer: BLUE CROSS/BLUE SHIELD

## 2016-10-19 DIAGNOSIS — E230 Hypopituitarism: Secondary | ICD-10-CM | POA: Diagnosis not present

## 2016-10-19 DIAGNOSIS — E221 Hyperprolactinemia: Secondary | ICD-10-CM

## 2016-10-19 LAB — CBC WITH DIFFERENTIAL/PLATELET
BASOS ABS: 0 10*3/uL (ref 0.0–0.1)
Basophils Relative: 0.5 % (ref 0.0–3.0)
Eosinophils Absolute: 0.2 10*3/uL (ref 0.0–0.7)
Eosinophils Relative: 2.6 % (ref 0.0–5.0)
HCT: 43.9 % (ref 39.0–52.0)
Hemoglobin: 14.7 g/dL (ref 13.0–17.0)
LYMPHS ABS: 2.7 10*3/uL (ref 0.7–4.0)
Lymphocytes Relative: 38.3 % (ref 12.0–46.0)
MCHC: 33.4 g/dL (ref 30.0–36.0)
MCV: 81 fl (ref 78.0–100.0)
MONO ABS: 0.5 10*3/uL (ref 0.1–1.0)
MONOS PCT: 7.7 % (ref 3.0–12.0)
NEUTROS ABS: 3.6 10*3/uL (ref 1.4–7.7)
NEUTROS PCT: 50.9 % (ref 43.0–77.0)
PLATELETS: 265 10*3/uL (ref 150.0–400.0)
RBC: 5.42 Mil/uL (ref 4.22–5.81)
RDW: 15.1 % (ref 11.5–15.5)
WBC: 7.1 10*3/uL (ref 4.0–10.5)

## 2016-10-19 LAB — LUTEINIZING HORMONE: LH: 4.7 m[IU]/mL (ref 1.50–9.30)

## 2016-10-21 LAB — TESTOSTERONE, FREE, TOTAL, SHBG
Sex Hormone Binding: 23.6 nmol/L (ref 16.5–55.9)
Testosterone, Free: 26.3 pg/mL — ABNORMAL HIGH (ref 6.8–21.5)
Testosterone: 644 ng/dL (ref 264–916)

## 2016-10-21 LAB — PROLACTIN: Prolactin: 10.7 ng/mL (ref 4.0–15.2)

## 2016-10-21 LAB — INSULIN-LIKE GROWTH FACTOR: INSULIN LIKE GF 1: 100 ng/mL (ref 67–205)

## 2016-11-13 ENCOUNTER — Other Ambulatory Visit: Payer: BLUE CROSS/BLUE SHIELD

## 2016-11-13 DIAGNOSIS — N400 Enlarged prostate without lower urinary tract symptoms: Secondary | ICD-10-CM

## 2016-11-13 DIAGNOSIS — E291 Testicular hypofunction: Secondary | ICD-10-CM

## 2016-11-14 LAB — TESTOSTERONE: Testosterone: 408 ng/dL (ref 264–916)

## 2016-11-14 LAB — PSA: Prostate Specific Ag, Serum: 2.2 ng/mL (ref 0.0–4.0)

## 2016-11-14 LAB — HEMATOCRIT: HEMATOCRIT: 43 % (ref 37.5–51.0)

## 2016-11-15 ENCOUNTER — Encounter: Payer: Self-pay | Admitting: Urology

## 2016-11-15 ENCOUNTER — Ambulatory Visit (INDEPENDENT_AMBULATORY_CARE_PROVIDER_SITE_OTHER): Payer: BLUE CROSS/BLUE SHIELD | Admitting: Urology

## 2016-11-15 VITALS — BP 153/87 | HR 112 | Ht 71.0 in | Wt 274.4 lb

## 2016-11-15 DIAGNOSIS — N138 Other obstructive and reflux uropathy: Secondary | ICD-10-CM

## 2016-11-15 DIAGNOSIS — N529 Male erectile dysfunction, unspecified: Secondary | ICD-10-CM

## 2016-11-15 DIAGNOSIS — E291 Testicular hypofunction: Secondary | ICD-10-CM | POA: Diagnosis not present

## 2016-11-15 DIAGNOSIS — N401 Enlarged prostate with lower urinary tract symptoms: Secondary | ICD-10-CM

## 2016-11-15 MED ORDER — SILDENAFIL CITRATE 20 MG PO TABS: ORAL_TABLET | ORAL | 3 refills | Status: DC

## 2016-11-15 NOTE — Progress Notes (Signed)
11/15/2016 3:49 PM   Kenneth Sherman 1971-06-07 [id number]  Referring provider: Jearld Fenton, NP [address] [place], [place] [address]  Chief Complaint  Patient presents with  . Hypogonadism    8 month follow up    HPI: Patient is a 45 year old Caucasian male with hypogonadism who presents today for a 6 month follow up.   Hypogonadism Patient is experiencing a decrease in libido, a lack of energy, a decrease in strength and erections being less strong.  This is indicated by his responses to the ADAM questionnaire.  He is still having spontaneous erections at night.   He does not have sleep apnea.  His current testosterone level is 408 ng/dL on 11/13/2016.  He is currently managing his hypogonadism with Clomid 50 mg, 1/2 tablet daily.  He has had a pituitary MRI on 10/06/2016 and the findings were 1 x 2 mm hypo enhancing lesion anterior pituitary unchanged the prior study. Separate 2 mm hypo enhancing lesion in the posterior pituitary . 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  Mild chronic white matter changes are stable since the prior study.  He is being referred to endocrinology.        Androgen Deficiency in the Aging Male    [place] Name 11/15/16 1500         Androgen Deficiency in the Aging Male   Do you have a decrease in libido (sex drive) Yes     Do you have lack of energy Yes     Do you have a decrease in strength and/or endurance Yes     Have you lost height No     Have you noticed a decreased "enjoyment of life" No     Are you sad and/or grumpy No     Are your erections less strong Yes     Have you noticed a recent deterioration in your ability to play sports No     Are you falling asleep after dinner No     Has there been a recent deterioration in your work performance No        Erectile dysfunction His SHIM score is 17, which is mild erectile dysfunction.   His previous SHIM was 23.  He does complaint of a mild difficulty  with achieving erections.  His libido is diminished.   His risk factors for ED are hypogonadism, HTN and increased BMI .  He denies any painful erections or curvatures with his erections.   He has tried sildenafil in the past with good results.     SHIM    Row Name 11/15/16 1543         SHIM: Over the last 6 months:   How do you rate your confidence that you could get and keep an erection? Moderate     When you had erections with sexual stimulation, how often were your erections hard enough for penetration (entering your partner)? Most Times (much more than half the time)     During sexual intercourse, how often were you able to maintain your erection after you had penetrated (entered) your partner? Slightly Difficult     During sexual intercourse, how difficult was it to maintain your erection to completion of intercourse? Difficult     When you attempted sexual intercourse, how often was it satisfactory for you? Difficult       SHIM Total Score   SHIM 17        Score: 1-7  Severe ED 8-11 Moderate ED 12-16 Mild-Moderate ED 17-21 Mild ED 22-25 No ED  BPH WITH LUTS His IPSS score today is 4, which is mild lower urinary tract symptomatology. He is pleased with his quality life due to his urinary symptoms.  His previous IPSS score was 2/2.  He is having frequency and nocturia x 1.  He denies any dysuria, hematuria or suprapubic pain.   He also denies any recent fevers, chills, nausea or vomiting.  He does not have a family history of PCa.     IPSS    Row Name 11/15/16 1500         International Prostate Symptom Score   How often have you had the sensation of not emptying your bladder? Not at All     How often have you had to urinate less than every two hours? About half the time     How often have you found you stopped and started again several times when you urinated? Not at All     How often have you found it difficult to postpone urination? Not at All     How often have you  had a weak urinary stream? Not at All     How often have you had to strain to start urination? Not at All     How many times did you typically get up at night to urinate? 1 Time     Total IPSS Score 4       Quality of Life due to urinary symptoms   If you were to spend the rest of your life with your urinary condition just the way it is now how would you feel about that? Pleased        Score:  1-7 Mild 8-19 Moderate 20-35 Severe  PMH: Past Medical History:  Diagnosis Date  . Arthritis   . Heartburn   . HLD (hyperlipidemia)   . Hypertension    no meds  . Hypogonadism in male   . Right knee DJD     Surgical History: Past Surgical History:  Procedure Laterality Date  . BICEPS TENDON REPAIR  05/2015  . KNEE ARTHROSCOPY W/ MENISCECTOMY  2008   left  . KNEE ARTHROSCOPY W/ MENISCECTOMY  2011   right  . PARTIAL KNEE ARTHROPLASTY  11/29/2012   Procedure: UNICOMPARTMENTAL KNEE;  Surgeon: Lorn Junes, MD;  Location: [facility];  Service: Orthopedics;  Laterality: Right;    Home Medications:    Medication List       Accurate as of 11/15/16  3:49 PM. Always use your most recent med list.          atorvastatin 10 MG tablet Commonly known as:  LIPITOR Take 1 tablet (10 mg total) by mouth daily at 6 PM.   clomiPHENE 50 MG tablet Commonly known as:  CLOMID Take 1 tablet (50 mg total) by mouth daily.   diazepam 10 MG tablet Commonly known as:  VALIUM Take 30 minutes prior to the MRI   levothyroxine 25 MCG tablet Commonly known as:  SYNTHROID, LEVOTHROID TAKE 1 TABLET (25 MCG TOTAL) BY MOUTH DAILY BEFORE BREAKFAST.   lisinopril-hydrochlorothiazide 10-12.5 MG tablet Commonly known as:  PRINZIDE,ZESTORETIC Take 1 tablet by mouth daily.   nortriptyline 10 MG capsule Commonly known as:  PAMELOR Take by mouth.   nortriptyline 50 MG capsule Commonly known as:  PAMELOR Take 1 capsule (50 mg total) by mouth at bedtime.   omeprazole 20 MG capsule Commonly known as:  PRILOSEC TAKE 1 CAPSULE (20 MG TOTAL) BY MOUTH DAILY.       Allergies: No Known Allergies  Family History: Family History  Problem Relation Age of Onset  . Heart attack Mother   . Hypertension Mother   . Hypertension Father   . Heart attack Father   . Hypertension Brother   . Kidney disease Neg Hx   . Prostate cancer Neg Hx   . Bladder Cancer Neg Hx     Social History:  reports that he has never smoked. He has never used smokeless tobacco. He reports that he drinks about 1.2 oz of alcohol per week . He reports that he does not use drugs.  ROS: UROLOGY Frequent Urination?: No Hard to postpone urination?: No Burning/pain with urination?: No Get up at night to urinate?: No Leakage of urine?: No Urine stream starts and stops?: No Trouble starting stream?: No Do you have to strain to urinate?: No Blood in urine?: No Urinary tract infection?: No Sexually transmitted disease?: No Injury to kidneys or bladder?: No Painful intercourse?: No Weak stream?: No Erection problems?: No Penile pain?: No  Gastrointestinal Nausea?: No Vomiting?: No Indigestion/heartburn?: No Diarrhea?: No Constipation?: No  Constitutional Fever: No Night sweats?: No Weight loss?: No Fatigue?: No  Skin Skin rash/lesions?: No Itching?: No  Eyes Blurred vision?: No Double vision?: No  Ears/Nose/Throat Sore throat?: No Sinus problems?: No  Hematologic/Lymphatic Swollen glands?: No Easy bruising?: No  Cardiovascular Leg swelling?: No Chest pain?: No  Respiratory Cough?: No Shortness of breath?: No  Endocrine Excessive thirst?: No  Musculoskeletal Back pain?: No Joint pain?: No  Neurological Headaches?: No Dizziness?: No  Psychologic Depression?: No Anxiety?: No  Physical Exam: BP (!) 153/87   Pulse (!) 112   Ht 5\' 11"  (1.803 m)   Wt 274 lb 6.4 oz (124.5 kg)   BMI 38.27 kg/m   Constitutional: Well nourished. Alert and oriented, No acute distress. HEENT: [place]  AT, moist mucus membranes. Trachea midline, no masses. Cardiovascular: No clubbing, cyanosis, or edema. Respiratory: Normal respiratory effort, no increased work of breathing. GI: Abdomen is soft, non tender, non distended, no abdominal masses. Liver and spleen not palpable.  No hernias appreciated.  Stool sample for occult testing is not indicated.   GU: No CVA tenderness.  No bladder fullness or masses.  Patient with circumcised  phallus.   Urethral meatus is patent.  No penile discharge. No penile lesions or rashes. Scrotum without lesions, cysts, rashes and/or edema.  Testicles are located scrotally bilaterally. No masses are appreciated in the testicles. Left and right epididymis are normal. Rectal: Patient with  normal sphincter tone. Anus and perineum without scarring or rashes. No rectal masses are appreciated. Prostate is approximately 50 grams, no nodules are appreciated. Seminal vesicles are normal. Skin: No rashes, bruises or suspicious lesions. Lymph: No cervical or inguinal adenopathy. Neurologic: Grossly intact, no focal deficits, moving all 4 extremities. Psychiatric: Normal mood and affect.  Laboratory Data: Lab Results  Component Value Date   WBC 7.1 10/19/2016   HGB 14.7 10/19/2016   HCT 43.0 11/13/2016   MCV 81.0 10/19/2016   PLT 265.0 10/19/2016   Lab Results  Component Value Date   CREATININE 1.19 09/18/2016   Lab Results  Component Value Date   PSA 2.24 11/14/2012       PSA                        1.6  8 months ago      PSA                        2.2                                                                  11/13/2016 Lab Results  Component Value Date   TESTOSTERONE 408 11/13/2016   Lab Results  Component Value Date   HGBA1C 6.3 09/18/2016      Component Value Date/Time   CHOL 171 02/09/2016 0913   HDL 34.60 (L) 02/09/2016 0913   CHOLHDL 5 02/09/2016 0913   VLDL 28.0 02/09/2016 0913    LDLCALC 108 (H) 02/09/2016 0913   Lab Results  Component Value Date   AST 22 11/24/2015   Lab Results  Component Value Date   ALT 27 11/24/2015     Assessment & Plan:    1. Hypogonadism:     -most recent testosterone level is 408 ng/dL on 11/13/2016  -continue Clomid 50 mg, 1/2 tablet daily; refills given  -RTC in 6 months for HCT, testosterone, PSA, LFT's, ADAM and exam  2. BPH with LUTS  - IPSS score is 4/1, it is worsening  - Continue conservative management, avoiding bladder irritants and timed voiding's  - RTC in 6 months for IPSS, PSA, PVR and exam, as testosterone therapy can cause prostate enlargement and worsen LUTS  3. Erectile dysfunction:     -SHIM score is 17  -continue sildenafil 20 mg, increase to 2 to 3 tablets before intercourse  -RTC in 6 months for SHIM score and exam, as testosterone therapy can affect erections    These notes generated with voice recognition software. I apologize for typographical errors.  Zara Council, [facility] [address] [place], [place] [address] [phone]

## 2016-11-19 ENCOUNTER — Other Ambulatory Visit: Payer: Self-pay | Admitting: Internal Medicine

## 2016-11-20 ENCOUNTER — Telehealth: Payer: Self-pay

## 2016-11-20 NOTE — Telephone Encounter (Signed)
Yes, send to Dr. Dwyane Dee if he changed dose

## 2016-11-20 NOTE — Telephone Encounter (Signed)
PA for sildenafil DENIED!  

## 2016-11-20 NOTE — Telephone Encounter (Signed)
Looks like pt saw Dr Dwyane Dee 11/207 and was instructed to increase to 41mcg--should I forward to Dr Dwyane Dee to manage? They did not d/c 76mcg from OV but is in OV note--please advise

## 2016-11-20 NOTE — Telephone Encounter (Signed)
Looks like pt was last seen by you, and you mentioned to change synthroid to 1mcg from 56mcg--please advise as PCP advised to forward to you

## 2016-11-21 ENCOUNTER — Other Ambulatory Visit: Payer: Self-pay | Admitting: Internal Medicine

## 2016-11-22 ENCOUNTER — Other Ambulatory Visit: Payer: Self-pay | Admitting: Internal Medicine

## 2016-11-27 ENCOUNTER — Encounter: Payer: BLUE CROSS/BLUE SHIELD | Admitting: Internal Medicine

## 2016-11-27 ENCOUNTER — Other Ambulatory Visit: Payer: Self-pay | Admitting: Internal Medicine

## 2016-11-27 ENCOUNTER — Encounter: Payer: Self-pay | Admitting: Internal Medicine

## 2016-11-29 ENCOUNTER — Encounter: Payer: Self-pay | Admitting: Internal Medicine

## 2016-11-29 ENCOUNTER — Ambulatory Visit (INDEPENDENT_AMBULATORY_CARE_PROVIDER_SITE_OTHER): Payer: BLUE CROSS/BLUE SHIELD | Admitting: Internal Medicine

## 2016-11-29 VITALS — BP 120/90 | HR 99 | Ht 71.0 in | Wt 276.0 lb

## 2016-11-29 DIAGNOSIS — K219 Gastro-esophageal reflux disease without esophagitis: Secondary | ICD-10-CM | POA: Diagnosis not present

## 2016-11-29 DIAGNOSIS — E039 Hypothyroidism, unspecified: Secondary | ICD-10-CM

## 2016-11-29 DIAGNOSIS — Z0001 Encounter for general adult medical examination with abnormal findings: Secondary | ICD-10-CM

## 2016-11-29 DIAGNOSIS — E7800 Pure hypercholesterolemia, unspecified: Secondary | ICD-10-CM

## 2016-11-29 DIAGNOSIS — N529 Male erectile dysfunction, unspecified: Secondary | ICD-10-CM

## 2016-11-29 DIAGNOSIS — F411 Generalized anxiety disorder: Secondary | ICD-10-CM

## 2016-11-29 DIAGNOSIS — I10 Essential (primary) hypertension: Secondary | ICD-10-CM | POA: Diagnosis not present

## 2016-11-29 DIAGNOSIS — E349 Endocrine disorder, unspecified: Secondary | ICD-10-CM

## 2016-11-29 DIAGNOSIS — H6123 Impacted cerumen, bilateral: Secondary | ICD-10-CM

## 2016-11-29 NOTE — Assessment & Plan Note (Signed)
Discussed avoiding triggers Discussed how weight loss could help improve his reflux Continue Prilosec for now

## 2016-11-29 NOTE — Assessment & Plan Note (Signed)
Controlled on Lisinopril HCT CMET today 

## 2016-11-29 NOTE — Assessment & Plan Note (Signed)
Continue Viagra prn 

## 2016-11-29 NOTE — Assessment & Plan Note (Signed)
Resolved  Will monitor

## 2016-11-29 NOTE — Patient Instructions (Signed)

## 2016-11-29 NOTE — Progress Notes (Signed)
Subjective:    Patient ID: Kenneth Sherman, male    DOB: [birth year], 45 y.o.   MRN: [id number]  HPI  Pt presents to the clinic today for his annual exam. He is also due for follow up of chronic conditions.  Frequent Headaches: Mostly tension related. He saw neurology for evaluation but is no longer following with them. He takes Nortriptyline with good relief.  Erectile Dysfunction: He is taking Viagra as needed. He reports it works well for him but insurance does not cover it.  GAD: Controlled off meds. He denies any current issues at this time.   HTN: BP controlled on Lisinopril-HCT. His BP today is  120/90. ECG from 11/2012 reviewed.  Hypotestosteronism: He is following with endocrinology Dr. Dwyane Dee. He is now taking Clomid. Testosterone levels from 10/2016 reviewed.  HLD: His last LDL was 108, 02/2016. He is taking Lipitor as prescribed. He does try to consume a low fat diet.  GERD: Triggered by spicy foods. He is taking Prilosec as prescribed. He denies breakthrough symptoms.  Hypothyroidism: Recently diagnosed this year. He has been taking Synthroid as prescribed. He reports Dr. Dwyane Dee recently increased his Synthroid but is not sure why. He has noticed some weight gain, but denies depression, cold intolerance, constipation or dry skin.  Flu: never Tetanus: 2013 Vision Screening: yearly Dentis: annually  Diet: He does consume meat. He consumes fruits and veggies a few times per week. He tries to avoid fried foods. He does drink a lot of water. Exercise: He plays softball during the Spring, no exercise currently.  Review of Systems      Past Medical History:  Diagnosis Date  . Arthritis   . Heartburn   . HLD (hyperlipidemia)   . Hypertension    no meds  . Hypogonadism in male   . Right knee DJD     Current Outpatient Prescriptions  Medication Sig Dispense Refill  . atorvastatin (LIPITOR) 10 MG tablet Take 1 tablet (10 mg total) by mouth daily at 6 PM. 30 tablet 5  .  clomiPHENE (CLOMID) 50 MG tablet Take 1 tablet (50 mg total) by mouth daily. 30 tablet 3  . levothyroxine (SYNTHROID, LEVOTHROID) 50 MCG tablet Take 1 tablet (50 mcg total) by mouth daily before breakfast. 30 tablet 2  . lisinopril-hydrochlorothiazide (PRINZIDE,ZESTORETIC) 10-12.5 MG tablet Take 1 tablet by mouth daily. 30 tablet 5  . nortriptyline (PAMELOR) 50 MG capsule TAKE 1 CAPSULE (50 MG TOTAL) BY MOUTH AT BEDTIME. 30 capsule 0  . omeprazole (PRILOSEC) 20 MG capsule Take 1 capsule (20 mg total) by mouth daily. MUST SCHEDULE ANNUAL PHYSICAL EXAM 30 capsule 1  . sildenafil (REVATIO) 20 MG tablet Take 3 to 5 tablets two hours before intercouse on an empty stomach.  Do not take with nitrates. 50 tablet 3   No current facility-administered medications for this visit.     No Known Allergies  Family History  Problem Relation Age of Onset  . Heart attack Mother   . Hypertension Mother   . Hypertension Father   . Heart attack Father   . Hypertension Brother   . Kidney disease Neg Hx   . Prostate cancer Neg Hx   . Bladder Cancer Neg Hx     Social History   Social History  . Marital status: Married    Spouse name: N/A  . Number of children: N/A  . Years of education: 44   Occupational History  . CAD Designer [employer]   Social History  Main Topics  . Smoking status: Never Smoker  . Smokeless tobacco: Never Used  . Alcohol use 1.2 oz/week    1 Glasses of wine, 1 Cans of beer per week     Comment: occasionally  . Drug use: No  . Sexual activity: Yes    Birth control/ protection: Condom   Other Topics Concern  . Not on file   Social History Narrative   Regular exercise-no   Caffeine Use-yes     Constitutional: Pt reports weight gain .Denies fever, malaise, fatigue, headache.  HEENT: Denies eye pain, eye redness, ear pain, ringing in the ears, wax buildup, runny nose, nasal congestion, bloody nose, or sore throat. Respiratory: Denies difficulty breathing, shortness  of breath, cough or sputum production.   Cardiovascular: Denies chest pain, chest tightness, palpitations or swelling in the hands or feet.  Gastrointestinal: Denies abdominal pain, bloating, constipation, diarrhea or blood in the stool.  GU: Denies urgency, frequency, pain with urination, burning sensation, blood in urine, odor or discharge. Musculoskeletal: Denies decrease in range of motion, difficulty with gait, muscle pain or joint pain and swelling.  Skin: Denies redness, rashes, lesions or ulcercations.  Neurological: Denies dizziness, difficulty with memory, difficulty with speech or problems with balance and coordination.  Psych: Denies anxiety, depression, SI/HI.  No other specific complaints in a complete review of systems (except as listed in HPI above).  Objective:   Physical Exam   BP 120/90   Pulse 99   Ht 5\' 11"  (1.803 m)   Wt 276 lb (125.2 kg)   SpO2 94%   BMI 38.49 kg/m   Wt Readings from Last 3 Encounters:  11/29/16 276 lb (125.2 kg)  11/15/16 274 lb 6.4 oz (124.5 kg)  10/18/16 275 lb (124.7 kg)    General: Appears his stated age, obese in NAD. Skin: Warm, dry and intact.  HEENT: Head: normal shape and size; Eyes: sclera white, no icterus, conjunctiva pink, PERRLA and EOMs intact; Ears: Bilateral cerumen impaction; Throat/Mouth: Teeth present, mucosa pink and moist, no exudate, lesions or ulcerations noted.  Neck:  Neck supple, trachea midline. No masses, lumps or thyromegaly present.  Cardiovascular: Normal rate and rhythm. S1,S2 noted.  No murmur, rubs or gallops noted. No BLE edema. No carotid bruits noted. Pulmonary/Chest: Normal effort and positive vesicular breath sounds. No respiratory distress. No wheezes, rales or ronchi noted.  Abdomen: Soft and nontender. Normal bowel sounds. No distention or masses noted. Liver, spleen and kidneys non palpable. Musculoskeletal: Strength 5/5 BUE/BLE.  No difficulty with gait.  Neurological: Alert and oriented.  Cranial nerves II-XII grossly intact. Coordination normal.  Psychiatric: Mood and affect normal. Behavior is normal. Judgment and thought content normal.    BMET    Component Value Date/Time   NA 141 09/18/2016 0914   K 4.4 09/18/2016 0914   CL 108 09/18/2016 0914   CO2 29 09/18/2016 0914   GLUCOSE 111 (H) 09/18/2016 0914   BUN 14 09/18/2016 0914   CREATININE 1.19 09/18/2016 0914   CREATININE 1.22 08/04/2016 1525   CALCIUM 8.8 09/18/2016 0914   GFRNONAA >60 03/09/2016 1621   GFRAA >60 03/09/2016 1621    Lipid Panel     Component Value Date/Time   CHOL 171 02/09/2016 0913   TRIG 140.0 02/09/2016 0913   HDL 34.60 (L) 02/09/2016 0913   CHOLHDL 5 02/09/2016 0913   VLDL 28.0 02/09/2016 0913   LDLCALC 108 (H) 02/09/2016 0913    CBC    Component Value Date/Time  WBC 7.1 10/19/2016 0929   RBC 5.42 10/19/2016 0929   HGB 14.7 10/19/2016 0929   HCT 43.0 11/13/2016 0823   PLT 265.0 10/19/2016 0929   MCV 81.0 10/19/2016 0929   MCH 28.5 12/02/2012 2009   MCHC 33.4 10/19/2016 0929   RDW 15.1 10/19/2016 0929   LYMPHSABS 2.7 10/19/2016 0929   MONOABS 0.5 10/19/2016 0929   EOSABS 0.2 10/19/2016 0929   BASOSABS 0.0 10/19/2016 0929    Hgb A1C Lab Results  Component Value Date   HGBA1C 6.3 09/18/2016        Assessment & Plan:   Preventative Health Maintenance:  He declines flu shot today Tetanus UTD Encouraged him to consume a balanced diet and exercise regimen Advised him to see an eye doctor and dentist annually Will check CMET, Lipid profile today  Bilateral Cerumen Impaction:  Manual Lavage by CMA Advised him to try Debrox OTC 2 x week to prevent wax buildup  RTC in 1 year or sooner if needed

## 2016-11-29 NOTE — Assessment & Plan Note (Signed)
Lipid profile and CMET today Encouraged him to consume a low fat diet Continue Lipitor for now 

## 2016-11-29 NOTE — Assessment & Plan Note (Signed)
He will continue Clomid for now He will follow with Dr. Dwyane Dee

## 2016-11-30 LAB — COMPREHENSIVE METABOLIC PANEL
ALK PHOS: 50 U/L (ref 39–117)
ALT: 21 U/L (ref 0–53)
AST: 22 U/L (ref 0–37)
Albumin: 4.1 g/dL (ref 3.5–5.2)
BILIRUBIN TOTAL: 0.3 mg/dL (ref 0.2–1.2)
BUN: 13 mg/dL (ref 6–23)
CALCIUM: 9 mg/dL (ref 8.4–10.5)
CO2: 33 mEq/L — ABNORMAL HIGH (ref 19–32)
Chloride: 102 mEq/L (ref 96–112)
Creatinine, Ser: 1.48 mg/dL (ref 0.40–1.50)
GFR: 54.3 mL/min — AB (ref >60.00)
Glucose, Bld: 109 mg/dL — ABNORMAL HIGH (ref 70–99)
Potassium: 4.4 mEq/L (ref 3.5–5.1)
Sodium: 140 mEq/L (ref 135–145)
TOTAL PROTEIN: 7.1 g/dL (ref 6.0–8.3)

## 2016-11-30 LAB — TSH: TSH: 3.13 u[IU]/mL (ref 0.35–4.50)

## 2016-11-30 LAB — T4, FREE: FREE T4: 0.74 ng/dL (ref 0.60–1.60)

## 2016-11-30 LAB — LIPID PANEL
CHOLESTEROL: 212 mg/dL — AB (ref 0–200)
HDL: 36.2 mg/dL — ABNORMAL LOW (ref >39.00)
NonHDL: 175.66
Total CHOL/HDL Ratio: 6
Triglycerides: 203 mg/dL — ABNORMAL HIGH (ref 0.0–149.0)
VLDL: 40.6 mg/dL — ABNORMAL HIGH (ref 0.0–40.0)

## 2016-11-30 LAB — LDL CHOLESTEROL, DIRECT: Direct LDL: 152 mg/dL

## 2016-12-02 ENCOUNTER — Encounter: Payer: Self-pay | Admitting: Internal Medicine

## 2016-12-02 MED ORDER — ATORVASTATIN CALCIUM 20 MG PO TABS: 20.0000 mg | ORAL_TABLET | Freq: Every day | ORAL | 5 refills | Status: DC

## 2016-12-06 ENCOUNTER — Other Ambulatory Visit: Payer: Self-pay | Admitting: Internal Medicine

## 2016-12-06 ENCOUNTER — Encounter: Payer: Self-pay | Admitting: Endocrinology

## 2016-12-06 MED ORDER — NORTRIPTYLINE HCL 50 MG PO CAPS: 50.0000 mg | ORAL_CAPSULE | Freq: Every day | ORAL | 5 refills | Status: DC

## 2017-01-16 ENCOUNTER — Other Ambulatory Visit: Payer: Self-pay | Admitting: Internal Medicine

## 2017-01-18 ENCOUNTER — Ambulatory Visit: Payer: BLUE CROSS/BLUE SHIELD | Admitting: Endocrinology

## 2017-01-19 ENCOUNTER — Other Ambulatory Visit: Payer: Self-pay | Admitting: Internal Medicine

## 2017-01-19 DIAGNOSIS — N529 Male erectile dysfunction, unspecified: Secondary | ICD-10-CM

## 2017-01-25 ENCOUNTER — Other Ambulatory Visit: Payer: Self-pay | Admitting: Urology

## 2017-01-25 DIAGNOSIS — E291 Testicular hypofunction: Secondary | ICD-10-CM

## 2017-02-14 ENCOUNTER — Other Ambulatory Visit: Payer: Self-pay | Admitting: Endocrinology

## 2017-03-20 ENCOUNTER — Other Ambulatory Visit: Payer: Self-pay | Admitting: Internal Medicine

## 2017-04-03 ENCOUNTER — Encounter: Payer: Self-pay | Admitting: Internal Medicine

## 2017-04-03 MED ORDER — ONDANSETRON HCL 4 MG PO TABS: 4.0000 mg | ORAL_TABLET | Freq: Three times a day (TID) | ORAL | 0 refills | Status: DC | PRN

## 2017-05-10 ENCOUNTER — Other Ambulatory Visit: Payer: Self-pay

## 2017-05-10 DIAGNOSIS — E291 Testicular hypofunction: Secondary | ICD-10-CM

## 2017-05-13 ENCOUNTER — Other Ambulatory Visit: Payer: Self-pay | Admitting: Endocrinology

## 2017-05-14 ENCOUNTER — Other Ambulatory Visit: Payer: BLUE CROSS/BLUE SHIELD

## 2017-05-14 DIAGNOSIS — E291 Testicular hypofunction: Secondary | ICD-10-CM

## 2017-05-14 NOTE — Progress Notes (Signed)
05/16/2017 4:11 PM   Kenneth Sherman 01-May-1971 [id number]  Referring provider: Jearld Fenton, NP [address] [place], [place] [address]  Chief Complaint  Patient presents with  . Hypogonadism    6 month follow up  . Benign Prostatic Hypertrophy    HPI: Patient is a 46 year old Caucasian male with testosterone deficiency who presents today for a 6 month follow up.   Testosterone deficiency Patient is experiencing a decrease in libido, a lack of energy, a decrease in strength, and a recent deterioration in his ability to play sports. and erections being less strong.  This is indicated by his responses to the ADAM questionnaire.  He is still having spontaneous erections at night.   He does not have sleep apnea.  His current testosterone level is 420 ng/dL on 05/14/2017.  He is currently managing his hypogonadism with Clomid 50 mg, 1/2 tablet daily.  He has had a pituitary MRI on 10/06/2016 and the findings were 1 x 2 mm hypo enhancing lesion anterior pituitary unchanged the prior study. Separate 2 mm hypo enhancing lesion in the posterior pituitary . 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  Mild chronic white matter changes are stable since the prior study.  He is being referred to endocrinology.        Androgen Deficiency in the Aging Male    [place] Name 05/16/17 1500         Androgen Deficiency in the Aging Male   Do you have a decrease in libido (sex drive) Yes     Do you have lack of energy Yes     Do you have a decrease in strength and/or endurance Yes     Have you lost height No     Have you noticed a decreased "enjoyment of life" No     Are you sad and/or grumpy No     Are your erections less strong Yes     Have you noticed a recent deterioration in your ability to play sports Yes     Are you falling asleep after dinner No     Has there been a recent deterioration in your work performance No        Erectile dysfunction His SHIM  score is 18, which is mild erectile dysfunction.   His previous SHIM was 17.  He does complaint of a mild difficulty with achieving erections.  His libido is diminished.   His risk factors for ED are hypogonadism, HTN and increased BMI .  He denies any painful erections or curvatures with his erections.   He has tried sildenafil in the past with good results.     SHIM    Row Name 05/16/17 1550         SHIM: Over the last 6 months:   How do you rate your confidence that you could get and keep an erection? Low     When you had erections with sexual stimulation, how often were your erections hard enough for penetration (entering your partner)? Most Times (much more than half the time)     During sexual intercourse, how often were you able to maintain your erection after you had penetrated (entered) your partner? Most Times (much more than half the time)     During sexual intercourse, how difficult was it to maintain your erection to completion of intercourse? Slightly Difficult     When you attempted sexual intercourse, how often was it satisfactory for  you? Most Times (much more than half the time)       SHIM Total Score   SHIM 18        Score: 1-7 Severe ED 8-11 Moderate ED 12-16 Mild-Moderate ED 17-21 Mild ED 22-25 No ED  BPH WITH LUTS His IPSS score today is 3, which is mild lower urinary tract symptomatology. He is mostly satisfied with his quality life due to his urinary symptoms.  His previous IPSS score was 4/0.  He is having frequency and nocturia x 1.  He denies any dysuria, hematuria or suprapubic pain.   He also denies any recent fevers, chills, nausea or vomiting.  He does not have a family history of PCa.     IPSS    Row Name 05/16/17 1500         International Prostate Symptom Score   How often have you had the sensation of not emptying your bladder? Less than 1 in 5     How often have you had to urinate less than every two hours? Not at All     How often have you  found you stopped and started again several times when you urinated? Not at All     How often have you found it difficult to postpone urination? Not at All     How often have you had a weak urinary stream? Less than 1 in 5 times     How often have you had to strain to start urination? Not at All     How many times did you typically get up at night to urinate? 1 Time     Total IPSS Score 3       Quality of Life due to urinary symptoms   If you were to spend the rest of your life with your urinary condition just the way it is now how would you feel about that? Mostly Satisfied        Score:  1-7 Mild 8-19 Moderate 20-35 Severe  PMH: Past Medical History:  Diagnosis Date  . Arthritis   . Heartburn   . HLD (hyperlipidemia)   . Hypertension    no meds  . Hypogonadism in male   . Right knee DJD   . Thyroid disease     Surgical History: Past Surgical History:  Procedure Laterality Date  . BICEPS TENDON REPAIR  05/2015  . KNEE ARTHROSCOPY W/ MENISCECTOMY  2008   left  . KNEE ARTHROSCOPY W/ MENISCECTOMY  2011   right  . PARTIAL KNEE ARTHROPLASTY  11/29/2012   Procedure: UNICOMPARTMENTAL KNEE;  Surgeon: Lorn Junes, MD;  Location: [facility];  Service: Orthopedics;  Laterality: Right;    Home Medications:  Allergies as of 05/16/2017   No Known Allergies     Medication List       Accurate as of 05/16/17  4:11 PM. Always use your most recent med list.          atorvastatin 20 MG tablet Commonly known as:  LIPITOR Take 1 tablet (20 mg total) by mouth daily.   clomiPHENE 50 MG tablet Commonly known as:  CLOMID TAKE 1 TABLET (50 MG TOTAL) BY MOUTH DAILY.   levothyroxine 50 MCG tablet Commonly known as:  SYNTHROID, LEVOTHROID TAKE 1 TABLET (50 MCG TOTAL) BY MOUTH DAILY BEFORE BREAKFAST.   lisinopril-hydrochlorothiazide 10-12.5 MG tablet Commonly known as:  PRINZIDE,ZESTORETIC Take 1 tablet by mouth daily.   nortriptyline 50 MG capsule Commonly known as:   PAMELOR Take  1 capsule (50 mg total) by mouth at bedtime.   omeprazole 20 MG capsule Commonly known as:  PRILOSEC Take 1 capsule (20 mg total) by mouth daily.   ondansetron 4 MG tablet Commonly known as:  ZOFRAN Take 1 tablet (4 mg total) by mouth every 8 (eight) hours as needed for nausea or vomiting.   sildenafil 20 MG tablet Commonly known as:  REVATIO Take 3 to 5 tablets two hours before intercouse on an empty stomach.  Do not take with nitrates.       Allergies: No Known Allergies  Family History: Family History  Problem Relation Age of Onset  . Heart attack Mother   . Hypertension Mother   . Hypertension Father   . Heart attack Father   . Hypertension Brother   . Kidney disease Neg Hx   . Prostate cancer Neg Hx   . Bladder Cancer Neg Hx     Social History:  reports that he has never smoked. He has never used smokeless tobacco. He reports that he drinks about 1.2 oz of alcohol per week . He reports that he does not use drugs.  ROS: UROLOGY Frequent Urination?: No Hard to postpone urination?: No Burning/pain with urination?: No Get up at night to urinate?: No Leakage of urine?: No Urine stream starts and stops?: No Trouble starting stream?: No Do you have to strain to urinate?: No Blood in urine?: No Urinary tract infection?: No Sexually transmitted disease?: No Injury to kidneys or bladder?: No Painful intercourse?: No Weak stream?: No Erection problems?: No Penile pain?: No  Gastrointestinal Nausea?: No Vomiting?: No Indigestion/heartburn?: No Diarrhea?: No Constipation?: No  Constitutional Fever: No Night sweats?: No Weight loss?: No Fatigue?: No  Skin Skin rash/lesions?: No Itching?: No  Eyes Blurred vision?: No Double vision?: No  Ears/Nose/Throat Sore throat?: No Sinus problems?: No  Hematologic/Lymphatic Swollen glands?: No Easy bruising?: No  Cardiovascular Leg swelling?: No Chest pain?: No  Respiratory Cough?:  No Shortness of breath?: No  Endocrine Excessive thirst?: No  Musculoskeletal Back pain?: No Joint pain?: No  Neurological Headaches?: No Dizziness?: No  Psychologic Depression?: No Anxiety?: No  Physical Exam: BP (!) 155/116   Pulse (!) 111   Ht 5\' 11"  (1.803 m)   Wt 287 lb 14.4 oz (130.6 kg)   BMI 40.15 kg/m   Constitutional: Well nourished. Alert and oriented, No acute distress. HEENT: [place] AT, moist mucus membranes. Trachea midline, no masses. Cardiovascular: No clubbing, cyanosis, or edema. Respiratory: Normal respiratory effort, no increased work of breathing. GI: Abdomen is soft, non tender, non distended, no abdominal masses. Liver and spleen not palpable.  No hernias appreciated.  Stool sample for occult testing is not indicated.   GU: No CVA tenderness.  No bladder fullness or masses.  Patient with circumcised  phallus.   Urethral meatus is patent.  No penile discharge. No penile lesions or rashes. Scrotum without lesions, cysts, rashes and/or edema.  Testicles are located scrotally bilaterally. No masses are appreciated in the testicles. Left and right epididymis are normal. Rectal: Patient with  normal sphincter tone. Anus and perineum without scarring or rashes. No rectal masses are appreciated. Prostate is approximately 50 grams, no nodules are appreciated. Seminal vesicles are normal. Skin: No rashes, bruises or suspicious lesions. Lymph: No cervical or inguinal adenopathy. Neurologic: Grossly intact, no focal deficits, moving all 4 extremities. Psychiatric: Normal mood and affect.  Laboratory Data: Lab Results  Component Value Date   WBC 7.1 10/19/2016   HGB  14.7 10/19/2016   HCT 41.2 05/14/2017   MCV 81.0 10/19/2016   PLT 265.0 10/19/2016   Lab Results  Component Value Date   CREATININE 1.48 11/29/2016   Lab Results  Component Value Date   PSA 2.24 11/14/2012       PSA                        1.6                                                                   8 months ago      PSA                        2.2                                                                  11/13/2016      PSA                        1.8                                                                   05/14/2017 Lab Results  Component Value Date   TESTOSTERONE 420 05/14/2017   Lab Results  Component Value Date   HGBA1C 6.3 09/18/2016      Component Value Date/Time   CHOL 212 (H) 11/29/2016 1626   HDL 36.20 (L) 11/29/2016 1626   CHOLHDL 6 11/29/2016 1626   VLDL 40.6 (H) 11/29/2016 1626   LDLCALC 108 (H) 02/09/2016 0913   Lab Results  Component Value Date   AST 21 05/14/2017   Lab Results  Component Value Date   ALT 23 05/14/2017     Assessment & Plan:    1. Testosterone deficiency  -most recent testosterone level is 420 ng/dL on 05/14/2017  -continue Clomid 50 mg, increase to 1 tablet daily; recheck testosterone level in one month  -RTC in 6 months for HCT, testosterone, PSA, LFT's, ADAM and exam  2. BPH with LUTS  - IPSS score is 3/2, it is improving  - Continue conservative management, avoiding bladder irritants and timed voiding's  - RTC in 6 months for IPSS, PSA, PVR and exam, as testosterone therapy can cause prostate enlargement and worsen LUTS  3. Erectile dysfunction:     -SHIM score is 18, it is improved  -continue sildenafil 20 mg, increase to 2 to 3 tablets before intercourse  -RTC in 6 months for SHIM score and exam, as testosterone therapy can affect erections   These notes generated with voice recognition software. I apologize for typographical errors.  Zara Council, [facility] [address] [place], [place]  [address] [phone]   

## 2017-05-15 LAB — HEPATIC FUNCTION PANEL
ALT: 23 IU/L (ref 0–44)
AST: 21 IU/L (ref 0–40)
Albumin: 4.2 g/dL (ref 3.5–5.5)
Alkaline Phosphatase: 75 IU/L (ref 39–117)
Bilirubin Total: 0.2 mg/dL (ref 0.0–1.2)
Bilirubin, Direct: 0.09 mg/dL (ref 0.00–0.40)
Total Protein: 7 g/dL (ref 6.0–8.5)

## 2017-05-15 LAB — PSA: PROSTATE SPECIFIC AG, SERUM: 1.8 ng/mL (ref 0.0–4.0)

## 2017-05-15 LAB — HEMATOCRIT: HEMATOCRIT: 41.2 % (ref 37.5–51.0)

## 2017-05-15 LAB — TESTOSTERONE: Testosterone: 420 ng/dL (ref 264–916)

## 2017-05-16 ENCOUNTER — Encounter: Payer: Self-pay | Admitting: Urology

## 2017-05-16 ENCOUNTER — Ambulatory Visit (INDEPENDENT_AMBULATORY_CARE_PROVIDER_SITE_OTHER): Payer: BLUE CROSS/BLUE SHIELD | Admitting: Urology

## 2017-05-16 VITALS — BP 155/116 | HR 111 | Ht 71.0 in | Wt 287.9 lb

## 2017-05-16 DIAGNOSIS — E349 Endocrine disorder, unspecified: Secondary | ICD-10-CM | POA: Diagnosis not present

## 2017-05-16 DIAGNOSIS — N401 Enlarged prostate with lower urinary tract symptoms: Secondary | ICD-10-CM

## 2017-05-16 DIAGNOSIS — N529 Male erectile dysfunction, unspecified: Secondary | ICD-10-CM

## 2017-05-16 DIAGNOSIS — N138 Other obstructive and reflux uropathy: Secondary | ICD-10-CM

## 2017-05-24 ENCOUNTER — Other Ambulatory Visit: Payer: Self-pay | Admitting: Internal Medicine

## 2017-06-11 ENCOUNTER — Other Ambulatory Visit: Payer: Self-pay | Admitting: Internal Medicine

## 2017-06-14 ENCOUNTER — Other Ambulatory Visit: Payer: BLUE CROSS/BLUE SHIELD

## 2017-06-14 DIAGNOSIS — E291 Testicular hypofunction: Secondary | ICD-10-CM

## 2017-06-15 ENCOUNTER — Telehealth: Payer: Self-pay

## 2017-06-15 DIAGNOSIS — E291 Testicular hypofunction: Secondary | ICD-10-CM

## 2017-06-15 LAB — TESTOSTERONE: Testosterone: 423 ng/dL (ref 264–916)

## 2017-06-15 LAB — PROLACTIN: PROLACTIN: 10.4 ng/mL (ref 4.0–15.2)

## 2017-06-15 NOTE — Telephone Encounter (Signed)
Patient notified and scheduled for 70months and lab orders placed

## 2017-06-15 NOTE — Telephone Encounter (Signed)
-----   Message from Nori Riis, PA-C sent at 06/15/2017  8:17 AM EDT ----- Please let Mr. Thielen know that his testosterone level and prolactin levels did not change with the increase of Clomid.  He can continue the Clomid 50 mg daily.  I will need to see him in 6 months for an ADAM, SHIM, I PSS and exam.  He will need a morning testosterone (before 10 AM), LFT's, HCT/HBG, PSA to be drawn before appointment

## 2017-09-01 ENCOUNTER — Other Ambulatory Visit: Payer: Self-pay | Admitting: Urology

## 2017-09-01 DIAGNOSIS — E291 Testicular hypofunction: Secondary | ICD-10-CM

## 2017-09-16 ENCOUNTER — Other Ambulatory Visit: Payer: Self-pay | Admitting: Internal Medicine

## 2017-10-15 ENCOUNTER — Other Ambulatory Visit: Payer: Self-pay | Admitting: Internal Medicine

## 2017-10-31 ENCOUNTER — Encounter: Payer: Self-pay | Admitting: Internal Medicine

## 2017-11-16 ENCOUNTER — Other Ambulatory Visit: Payer: Self-pay | Admitting: Internal Medicine

## 2017-12-07 ENCOUNTER — Other Ambulatory Visit: Payer: BLUE CROSS/BLUE SHIELD

## 2017-12-07 DIAGNOSIS — E291 Testicular hypofunction: Secondary | ICD-10-CM

## 2017-12-08 LAB — HEPATIC FUNCTION PANEL
ALT: 26 IU/L (ref 0–44)
AST: 22 IU/L (ref 0–40)
Albumin: 4.5 g/dL (ref 3.5–5.5)
Alkaline Phosphatase: 83 IU/L (ref 39–117)
BILIRUBIN, DIRECT: 0.08 mg/dL (ref 0.00–0.40)
Total Protein: 7.5 g/dL (ref 6.0–8.5)

## 2017-12-08 LAB — HEMOGLOBIN AND HEMATOCRIT, BLOOD
HEMATOCRIT: 43.4 % (ref 37.5–51.0)
Hemoglobin: 14.8 g/dL (ref 13.0–17.7)

## 2017-12-08 LAB — PSA: PROSTATE SPECIFIC AG, SERUM: 2 ng/mL (ref 0.0–4.0)

## 2017-12-08 LAB — TESTOSTERONE: Testosterone: 340 ng/dL (ref 264–916)

## 2017-12-16 NOTE — Progress Notes (Signed)
12/17/2017 9:46 AM   Kenneth Sherman August 30, 1971 [id number]  Referring provider: Jearld Fenton, NP [address] [place], [place] [address]  Chief Complaint  Patient presents with  . Hypogonadism    HPI: Patient is a 47 year old Caucasian male with testosterone deficiency who presents today for a 6 month follow up.   Testosterone deficiency Patient is experiencing a decrease in libido, lack of energy, decrease in strength and/or endurance, erections less strong and recent deterioration in his ability to play sports.    This is indicated by his responses to the ADAM questionnaire.  He is still having spontaneous erections at night.   He does not have sleep apnea.  His current testosterone level is 340 ng/dL on 11/2017.  He is currently managing his hypogonadism with Clomid 50 mg, 1 tablet daily.  He has had a pituitary MRI on 10/06/2016 and the findings were 1 x 2 mm hypo enhancing lesion anterior pituitary unchanged the prior study. Separate 2 mm hypo enhancing lesion in the posterior pituitary . 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  Mild chronic white matter changes are stable since the prior study.  He was seen by Dr. Dwyane Dee, but he missed his follow up appointment in 01/2017.      Androgen Deficiency in the Aging Male    [place] Name 12/17/17 0900         Androgen Deficiency in the Aging Male   Do you have a decrease in libido (sex drive)  Yes     Do you have lack of energy  Yes     Do you have a decrease in strength and/or endurance  Yes     Have you lost height  No     Have you noticed a decreased "enjoyment of life"  No     Are you sad and/or grumpy  No     Are your erections less strong  Yes     Have you noticed a recent deterioration in your ability to play sports  Yes     Are you falling asleep after dinner  No     Has there been a recent deterioration in your work performance  No        Erectile dysfunction His SHIM score is 14,  which is mild to moderate erectile dysfunction.   His previous SHIM was 18.  He does complaint of a mild difficulty with achieving erections.  His libido is diminished.   His risk factors for ED are testosterone definciency, HTN and increased BMI .  He denies any painful erections or curvatures with his erections.   He has tried sildenafil in the past with good results. SHIM    Row Name 12/17/17 0907         SHIM: Over the last 6 months:   How do you rate your confidence that you could get and keep an erection?  Low     When you had erections with sexual stimulation, how often were your erections hard enough for penetration (entering your partner)?  Sometimes (about half the time)     During sexual intercourse, how often were you able to maintain your erection after you had penetrated (entered) your partner?  Sometimes (about half the time)     During sexual intercourse, how difficult was it to maintain your erection to completion of intercourse?  Difficult     When you attempted sexual intercourse, how often was it satisfactory for  you?  Sometimes (about half the time)       SHIM Total Score   SHIM  14        Score: 1-7 Severe ED 8-11 Moderate ED 12-16 Mild-Moderate ED 17-21 Mild ED 22-25 No ED  BPH WITH LUTS His IPSS score today is 4, which is mild lower urinary tract symptomatology. He is pleased with his quality life due to his urinary symptoms.  His previous IPSS score was 3/2.  His major complaints today is nocturia x 1.  He denies any dysuria, hematuria or suprapubic pain.   He also denies any recent fevers, chills, nausea or vomiting.  He does not have a family history of PCa. IPSS    Row Name 12/17/17 0900         International Prostate Symptom Score   How often have you had the sensation of not emptying your bladder?  Not at All     How often have you had to urinate less than every two hours?  Less than 1 in 5 times     How often have you found you stopped and started  again several times when you urinated?  Less than 1 in 5 times     How often have you found it difficult to postpone urination?  Not at All     How often have you had a weak urinary stream?  Less than 1 in 5 times     How often have you had to strain to start urination?  Not at All     How many times did you typically get up at night to urinate?  1 Time     Total IPSS Score  4       Quality of Life due to urinary symptoms   If you were to spend the rest of your life with your urinary condition just the way it is now how would you feel about that?  Pleased        Score:  1-7 Mild 8-19 Moderate 20-35 Severe  PMH: Past Medical History:  Diagnosis Date  . Arthritis   . Heartburn   . HLD (hyperlipidemia)   . Hypertension    no meds  . Hypogonadism in male   . Right knee DJD   . Thyroid disease     Surgical History: Past Surgical History:  Procedure Laterality Date  . BICEPS TENDON REPAIR  05/2015  . KNEE ARTHROSCOPY W/ MENISCECTOMY  2008   left  . KNEE ARTHROSCOPY W/ MENISCECTOMY  2011   right  . PARTIAL KNEE ARTHROPLASTY  11/29/2012   Procedure: UNICOMPARTMENTAL KNEE;  Surgeon: Lorn Junes, MD;  Location: [facility];  Service: Orthopedics;  Laterality: Right;    Home Medications:  Allergies as of 12/17/2017   No Known Allergies     Medication List        Accurate as of 12/17/17  9:46 AM. Always use your most recent med list.          atorvastatin 20 MG tablet Commonly known as:  LIPITOR TAKE 1 TABLET (20 MG TOTAL) BY MOUTH DAILY.   clomiPHENE 50 MG tablet Commonly known as:  CLOMID TAKE 1 TABLET (50 MG TOTAL) BY MOUTH DAILY.   levothyroxine 50 MCG tablet Commonly known as:  SYNTHROID, LEVOTHROID TAKE 1 TABLET (50 MCG TOTAL) BY MOUTH DAILY BEFORE BREAKFAST.   lisinopril-hydrochlorothiazide 10-12.5 MG tablet Commonly known as:  PRINZIDE,ZESTORETIC TAKE 1 TABLET BY MOUTH DAILY.   nortriptyline 50 MG capsule  Commonly known as:  PAMELOR TAKE 1 CAPSULE (50 MG  TOTAL) BY MOUTH AT BEDTIME.   omeprazole 20 MG capsule Commonly known as:  PRILOSEC TAKE 1 CAPSULE (20 MG TOTAL) BY MOUTH DAILY.   sildenafil 20 MG tablet Commonly known as:  REVATIO Take 3 to 5 tablets two hours before intercouse on an empty stomach.  Do not take with nitrates.   Testosterone 5.5 MG/ACT Gel Commonly known as:  NATESTO Place 1 Pump into the nose 2 (two) times daily.       Allergies: No Known Allergies  Family History: Family History  Problem Relation Age of Onset  . Heart attack Mother   . Hypertension Mother   . Hypertension Father   . Heart attack Father   . Hypertension Brother   . Kidney disease Neg Hx   . Prostate cancer Neg Hx   . Bladder Cancer Neg Hx     Social History:  reports that  has never smoked. he has never used smokeless tobacco. He reports that he drinks about 1.2 oz of alcohol per week. He reports that he does not use drugs.  ROS: UROLOGY Frequent Urination?: No Hard to postpone urination?: No Burning/pain with urination?: No Get up at night to urinate?: Yes Leakage of urine?: No Urine stream starts and stops?: No Trouble starting stream?: No Do you have to strain to urinate?: No Blood in urine?: No Urinary tract infection?: No Sexually transmitted disease?: No Injury to kidneys or bladder?: No Painful intercourse?: No Weak stream?: No Erection problems?: Yes Penile pain?: No  Gastrointestinal Nausea?: No Vomiting?: No Indigestion/heartburn?: No Diarrhea?: No Constipation?: No  Constitutional Fever: No Night sweats?: No Weight loss?: No Fatigue?: Yes  Skin Skin rash/lesions?: No Itching?: No  Eyes Blurred vision?: No Double vision?: No  Ears/Nose/Throat Sore throat?: No Sinus problems?: No  Hematologic/Lymphatic Swollen glands?: No Easy bruising?: No  Cardiovascular Leg swelling?: No Chest pain?: No  Respiratory Cough?: No Shortness of breath?: No  Endocrine Excessive thirst?:  No  Musculoskeletal Back pain?: No Joint pain?: No  Neurological Headaches?: No Dizziness?: No  Psychologic Depression?: No Anxiety?: No  Physical Exam: BP 137/89 (BP Location: Right Arm, Patient Position: Sitting, Cuff Size: Large)   Pulse 99   Ht 5\' 11"  (1.803 m)   Wt 288 lb 8 oz (130.9 kg)   BMI 40.24 kg/m   Constitutional: Well nourished. Alert and oriented, No acute distress. HEENT: [place] AT, moist mucus membranes. Trachea midline, no masses. Cardiovascular: No clubbing, cyanosis, or edema. Respiratory: Normal respiratory effort, no increased work of breathing. GI: Abdomen is soft, non tender, non distended, no abdominal masses. Liver and spleen not palpable.  No hernias appreciated.  Stool sample for occult testing is not indicated.   GU: No CVA tenderness.  No bladder fullness or masses.  Patient with circumcised phallus.    Urethral meatus is patent.  No penile discharge. No penile lesions or rashes. Scrotum without lesions, cysts, rashes and/or edema.  Testicles are located scrotally bilaterally. No masses are appreciated in the testicles. Left and right epididymis are normal. Rectal: Patient with  normal sphincter tone. Anus and perineum without scarring or rashes. No rectal masses are appreciated. Prostate is approximately 50 grams, no nodules are appreciated. Seminal vesicles are normal. Skin: No rashes, bruises or suspicious lesions. Lymph: No cervical or inguinal adenopathy. Neurologic: Grossly intact, no focal deficits, moving all 4 extremities. Psychiatric: Normal mood and affect.   Laboratory Data: Lab Results  Component Value Date  PSA 2.24 11/14/2012       PSA                        1.6                                                                  8 months ago      PSA                        2.2                                                                  11/13/2016      PSA                        1.8                                                                    05/14/2017      PSA                        2.0                                                                   11/2017  Lab Results  Component Value Date   TESTOSTERONE 340 12/07/2017   Lab Results  Component Value Date   AST 22 12/07/2017   Lab Results  Component Value Date   ALT 26 12/07/2017   I have reviewed the labs  Assessment & Plan:    1. Testosterone deficiency  -most recent testosterone level is 340 ng/dL on 11/2017 (therapeutic levels 450-600)  - change to [place], 1 pump into each nostril bid - recheck testosterone in one month  -RTC in 6 months for HCT, testosterone, PSA, ADAM and exam  2. BPH with LUTS  - IPSS score is 4/1, it is worsening  - Continue conservative management, avoiding bladder irritants and timed voiding's  - RTC in 6 months for IPSS, PSA and exam, as testosterone therapy can cause prostate enlargement and worsen LUTS  3. Erectile dysfunction:     -SHIM score is 14, it is slightly worse  -continue sildenafil 20 mg, increase to 2 to 3 tablets before intercourse  -RTC in 6 months for SHIM score and exam, as testosterone therapy can affect erections   These notes generated with voice recognition software. I apologize for typographical errors.  Kanna Dafoe, PA-C  [facility] [address], [place], [place] [address] [phone]

## 2017-12-17 ENCOUNTER — Encounter: Payer: Self-pay | Admitting: Urology

## 2017-12-17 ENCOUNTER — Ambulatory Visit (INDEPENDENT_AMBULATORY_CARE_PROVIDER_SITE_OTHER): Payer: BLUE CROSS/BLUE SHIELD | Admitting: Urology

## 2017-12-17 VITALS — BP 137/89 | HR 99 | Ht 71.0 in | Wt 288.5 lb

## 2017-12-17 DIAGNOSIS — N138 Other obstructive and reflux uropathy: Secondary | ICD-10-CM | POA: Diagnosis not present

## 2017-12-17 DIAGNOSIS — N401 Enlarged prostate with lower urinary tract symptoms: Secondary | ICD-10-CM

## 2017-12-17 DIAGNOSIS — N529 Male erectile dysfunction, unspecified: Secondary | ICD-10-CM | POA: Diagnosis not present

## 2017-12-17 DIAGNOSIS — E349 Endocrine disorder, unspecified: Secondary | ICD-10-CM

## 2017-12-17 MED ORDER — TESTOSTERONE 5.5 MG/ACT NA GEL: 1.0000 | Freq: Two times a day (BID) | NASAL | 5 refills | Status: DC

## 2017-12-24 ENCOUNTER — Encounter: Payer: Self-pay | Admitting: Urology

## 2018-01-04 ENCOUNTER — Encounter: Payer: Self-pay | Admitting: Urology

## 2018-01-14 ENCOUNTER — Encounter: Payer: Self-pay | Admitting: Urology

## 2018-01-16 ENCOUNTER — Other Ambulatory Visit: Payer: Self-pay | Admitting: Urology

## 2018-01-18 ENCOUNTER — Other Ambulatory Visit: Payer: BLUE CROSS/BLUE SHIELD

## 2018-01-28 ENCOUNTER — Other Ambulatory Visit: Payer: Self-pay | Admitting: Internal Medicine

## 2018-02-18 ENCOUNTER — Encounter: Payer: Self-pay | Admitting: Urology

## 2018-02-25 ENCOUNTER — Other Ambulatory Visit: Payer: Self-pay | Admitting: Internal Medicine

## 2018-04-02 ENCOUNTER — Other Ambulatory Visit: Payer: Self-pay | Admitting: Internal Medicine

## 2018-04-03 ENCOUNTER — Encounter: Payer: Self-pay | Admitting: Internal Medicine

## 2018-04-28 ENCOUNTER — Other Ambulatory Visit: Payer: Self-pay | Admitting: Internal Medicine

## 2018-06-04 ENCOUNTER — Encounter: Payer: Self-pay | Admitting: Internal Medicine

## 2018-06-04 ENCOUNTER — Ambulatory Visit (INDEPENDENT_AMBULATORY_CARE_PROVIDER_SITE_OTHER): Payer: 59 | Admitting: Internal Medicine

## 2018-06-04 VITALS — BP 134/92 | HR 102 | Temp 98.1°F | Ht 71.0 in | Wt 289.0 lb

## 2018-06-04 DIAGNOSIS — F411 Generalized anxiety disorder: Secondary | ICD-10-CM | POA: Diagnosis not present

## 2018-06-04 DIAGNOSIS — E349 Endocrine disorder, unspecified: Secondary | ICD-10-CM | POA: Diagnosis not present

## 2018-06-04 DIAGNOSIS — R51 Headache: Secondary | ICD-10-CM

## 2018-06-04 DIAGNOSIS — K219 Gastro-esophageal reflux disease without esophagitis: Secondary | ICD-10-CM

## 2018-06-04 DIAGNOSIS — N529 Male erectile dysfunction, unspecified: Secondary | ICD-10-CM | POA: Diagnosis not present

## 2018-06-04 DIAGNOSIS — I10 Essential (primary) hypertension: Secondary | ICD-10-CM | POA: Diagnosis not present

## 2018-06-04 DIAGNOSIS — E7800 Pure hypercholesterolemia, unspecified: Secondary | ICD-10-CM | POA: Diagnosis not present

## 2018-06-04 DIAGNOSIS — Z0000 Encounter for general adult medical examination without abnormal findings: Secondary | ICD-10-CM

## 2018-06-04 DIAGNOSIS — M1711 Unilateral primary osteoarthritis, right knee: Secondary | ICD-10-CM | POA: Diagnosis not present

## 2018-06-04 DIAGNOSIS — R519 Headache, unspecified: Secondary | ICD-10-CM

## 2018-06-04 MED ORDER — OMEPRAZOLE 20 MG PO CPDR: 20.0000 mg | DELAYED_RELEASE_CAPSULE | Freq: Every day | ORAL | 1 refills | Status: DC

## 2018-06-04 NOTE — Progress Notes (Signed)
Subjective:    Patient ID: Kenneth Sherman, male    DOB: January 02, 1971, 47 y.o.   MRN: [id number]  HPI  Pt presents to the clinic today for his annual exam. He is also due to follow up chronic conditions.  HLD: His last LDL was 152, 11/2016. He denies myalgias on Atorvastatin. He does eat some fried food.  Hpothyroidism: He denies any issues on his current dose of Synthroid. He is due for repeat labs today.  HTN: His BP today is 134/92. He is taking Lisinopril HCT as prescribed. ECG from 11/2012 reviewed.   GERD: He is having breakthrough symptoms since he stopped his Omeprazole. He is considering going back on this.  ED: He takes Sidenafil as prescribed.  Frequent Headaches: Asymptomatic on Nortriptyline. He is not interested in weaning this medication.  Hypotestosteronisim: He is not longer taking injections due to biceps tendon rupture. He was taking Clomid prescribed by urology. He is now seeing a new specialist, taking Testosterone gel.  GAD: Currently not an issue. Not medicated.  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 He takes Ibuprofen as needed with good relief.    Flu: never Tetanus: 2013 Vision Screening: annually Dentist: annually  Diet: He deoes eat meat. He consumes fruits and veggies daily. He does eat some fried food. He drinks mostly soda. Exercise: Walking  Review of Systems      Past Medical History:  Diagnosis Date  . Arthritis   . Heartburn   . HLD (hyperlipidemia)   . Hypertension    no meds  . Hypogonadism in male   . Right knee DJD   . Thyroid disease     Current Outpatient Medications  Medication Sig Dispense Refill  . atorvastatin (LIPITOR) 20 MG tablet TAKE 1 TABLET (20 MG TOTAL) BY MOUTH DAILY. 30 tablet 1  . clomiPHENE (CLOMID) 50 MG tablet TAKE 1 TABLET (50 MG TOTAL) BY MOUTH DAILY. 30 tablet 3  . levothyroxine (SYNTHROID, LEVOTHROID) 50 MCG tablet TAKE 1 TABLET (50 MCG TOTAL) BY MOUTH DAILY BEFORE BREAKFAST. 30 tablet 2  .  lisinopril-hydrochlorothiazide (PRINZIDE,ZESTORETIC) 10-12.5 MG tablet TAKE 1 TABLET BY MOUTH DAILY. 30 tablet 1  . nortriptyline (PAMELOR) 50 MG capsule TAKE 1 CAPSULE (50 MG TOTAL) BY MOUTH AT BEDTIME. 30 capsule 1  . omeprazole (PRILOSEC) 20 MG capsule TAKE 1 CAPSULE (20 MG TOTAL) BY MOUTH DAILY. 30 capsule 1  . sildenafil (REVATIO) 20 MG tablet TAKE 3 TO 5 TABLETS TWO HOURS BEFORE INTERCOUSE ON AN EMPTY STOMACH. DO NOT TAKE WITH NITRATES. 50 tablet 0  . Testosterone (NATESTO) 5.5 MG/ACT GEL Place 1 Pump into the nose 2 (two) times daily. 7.32 g 5   No current facility-administered medications for this visit.     No Known Allergies  Family History  Problem Relation Age of Onset  . Heart attack Mother   . Hypertension Mother   . Hypertension Father   . Heart attack Father   . Hypertension Brother   . Kidney disease Neg Hx   . Prostate cancer Neg Hx   . Bladder Cancer Neg Hx     Social History   Socioeconomic History  . Marital status: Married    Spouse name: Not on file  . Number of children: Not on file  . Years of education: 31  . Highest education level: Not on file  Occupational History  . Occupation: CAD Games developer: [employer]  . Financial resource strain: Not on file  .  Food insecurity:    Worry: Not on file    Inability: Not on file  . Transportation needs:    Medical: Not on file    Non-medical: Not on file  Tobacco Use  . Smoking status: Never Smoker  . Smokeless tobacco: Never Used  Substance and Sexual Activity  . Alcohol use: Yes    Alcohol/week: 1.2 oz    Types: 1 Glasses of wine, 1 Cans of beer per week    Comment: occasionally  . Drug use: No  . Sexual activity: Yes    Birth control/protection: Condom  Lifestyle  . Physical activity:    Days per week: Not on file    Minutes per session: Not on file  . Stress: Not on file  Relationships  . Social connections:    Talks on phone: Not on file    Gets together: Not on  file    Attends religious service: Not on file    Active member of club or organization: Not on file    Attends meetings of clubs or organizations: Not on file    Relationship status: Not on file  . Intimate partner violence:    Fear of current or ex partner: Not on file    Emotionally abused: Not on file    Physically abused: Not on file    Forced sexual activity: Not on file  Other Topics Concern  . Not on file  Social History Narrative   Regular exercise-no   Caffeine Use-yes     Constitutional: Pt reports weight gain. Denies fever, malaise, fatigue, headache.  HEENT: Denies eye pain, eye redness, ear pain, ringing in the ears, wax buildup, runny nose, nasal congestion, bloody nose, or sore throat. Respiratory: Denies difficulty breathing, shortness of breath, cough or sputum production.   Cardiovascular: Denies chest pain, chest tightness, palpitations or swelling in the hands or feet.  Gastrointestinal: Pt reports reflux. Denies abdominal pain, bloating, constipation, diarrhea or blood in the stool.  GU: Pt reports erectile dysfunction. Denies urgency, frequency, pain with urination, burning sensation, blood in urine, odor or discharge. Musculoskeletal: Pt reports intermittent knee pain. Denies decrease in range of motion, difficulty with gait, muscle pain or joint swelling.  Skin: Denies redness, rashes, lesions or ulcercations.  Neurological: Denies dizziness, difficulty with memory, difficulty with speech or problems with balance and coordination.  Psych: Pt has a history of anxiety. Denies  depression, SI/HI.  No other specific complaints in a complete review of systems (except as listed in HPI above).  Objective:   Physical Exam   BP (!) 134/92   Pulse (!) 102   Temp 98.1 F (36.7 C) (Oral)   Ht 5\' 11"  (1.803 m)   Wt 289 lb (131.1 kg)   SpO2 98%   BMI 40.31 kg/m   Wt Readings from Last 3 Encounters:  12/17/17 288 lb 8 oz (130.9 kg)  05/16/17 287 lb 14.4 oz  (130.6 kg)  11/29/16 276 lb (125.2 kg)    General: Appears his stated age, obese in NAD. Skin: Warm, dry and intact.  HEENT: Head: normal shape and size; Eyes: sclera white, no icterus, conjunctiva pink, PERRLA and EOMs intact; Ears: Tm's gray and intact, normal light reflex;  Throat/Mouth: Teeth present, mucosa pink and moist, no exudate, lesions or ulcerations noted.  Neck:  Neck supple, trachea midline. No masses, lumps present.  Cardiovascular: Normal rate and rhythm. S1,S2 noted.  No murmur, rubs or gallops noted. No JVD or BLE edema.  Pulmonary/Chest: Normal  effort and positive vesicular breath sounds. No respiratory distress. No wheezes, rales or ronchi noted.  Abdomen: Soft and nontender. Normal bowel sounds. No distention or masses noted. Liver, spleen and kidneys non palpable. Musculoskeletal: Strength 5/5 BUE/BLE. No difficulty with gait.  Neurological: Alert and oriented. Cranial nerves II-XII grossly intact. Coordination normal.  Psychiatric: Mood and affect normal. Behavior is normal. Judgment and thought content normal.     BMET    Component Value Date/Time   NA 140 11/29/2016 1626   K 4.4 11/29/2016 1626   CL 102 11/29/2016 1626   CO2 33 (H) 11/29/2016 1626   GLUCOSE 109 (H) 11/29/2016 1626   BUN 13 11/29/2016 1626   CREATININE 1.48 11/29/2016 1626   CREATININE 1.22 08/04/2016 1525   CALCIUM 9.0 11/29/2016 1626   GFRNONAA >60 03/09/2016 1621   GFRAA >60 03/09/2016 1621    Lipid Panel     Component Value Date/Time   CHOL 212 (H) 11/29/2016 1626   TRIG 203.0 (H) 11/29/2016 1626   HDL 36.20 (L) 11/29/2016 1626   CHOLHDL 6 11/29/2016 1626   VLDL 40.6 (H) 11/29/2016 1626   LDLCALC 108 (H) 02/09/2016 0913    CBC    Component Value Date/Time   WBC 7.1 10/19/2016 0929   RBC 5.42 10/19/2016 0929   HGB 14.8 12/07/2017 0847   HCT 43.4 12/07/2017 0847   PLT 265.0 10/19/2016 0929   MCV 81.0 10/19/2016 0929   MCH 28.5 12/02/2012 2009   MCHC 33.4 10/19/2016 0929    RDW 15.1 10/19/2016 0929   LYMPHSABS 2.7 10/19/2016 0929   MONOABS 0.5 10/19/2016 0929   EOSABS 0.2 10/19/2016 0929   BASOSABS 0.0 10/19/2016 0929    Hgb A1C Lab Results  Component Value Date   HGBA1C 6.3 09/18/2016           Assessment & Plan:   Preventative Health Maintenance:  Encouraged him to get a flu shot in the fall Tetanus UTD Encouraged him to consume a balanced diet and exercise regimen Advised him to see an eye doctor and dentist annually Will check CBC, CMET, Lipid, TSH, Free T4 and A1C today  RTC in 1 year, sooner if needed Webb Silversmith, NP

## 2018-06-04 NOTE — Patient Instructions (Signed)

## 2018-06-05 LAB — COMPREHENSIVE METABOLIC PANEL
ALBUMIN: 4.5 g/dL (ref 3.5–5.2)
ALK PHOS: 83 U/L (ref 39–117)
ALT: 25 U/L (ref 0–53)
AST: 22 U/L (ref 0–37)
BUN: 10 mg/dL (ref 6–23)
CALCIUM: 9.8 mg/dL (ref 8.4–10.5)
CO2: 29 mEq/L (ref 19–32)
Chloride: 101 mEq/L (ref 96–112)
Creatinine, Ser: 1.25 mg/dL (ref 0.40–1.50)
GFR: 65.56 mL/min (ref >60.00)
Glucose, Bld: 142 mg/dL — ABNORMAL HIGH (ref 70–99)
POTASSIUM: 4.6 meq/L (ref 3.5–5.1)
Sodium: 140 mEq/L (ref 135–145)
TOTAL PROTEIN: 7.8 g/dL (ref 6.0–8.3)
Total Bilirubin: 0.3 mg/dL (ref 0.2–1.2)

## 2018-06-05 LAB — LIPID PANEL
CHOLESTEROL: 223 mg/dL — AB (ref 0–200)
HDL: 41.6 mg/dL (ref >39.00)
NonHDL: 181.72
TRIGLYCERIDES: 236 mg/dL — AB (ref 0.0–149.0)
Total CHOL/HDL Ratio: 5
VLDL: 47.2 mg/dL — ABNORMAL HIGH (ref 0.0–40.0)

## 2018-06-05 LAB — CBC
HCT: 43.9 % (ref 39.0–52.0)
HEMOGLOBIN: 14.7 g/dL (ref 13.0–17.0)
MCHC: 33.4 g/dL (ref 30.0–36.0)
MCV: 82.1 fl (ref 78.0–100.0)
PLATELETS: 357 10*3/uL (ref 150.0–400.0)
RBC: 5.36 Mil/uL (ref 4.22–5.81)
RDW: 14.7 % (ref 11.5–15.5)
WBC: 9.4 10*3/uL (ref 4.0–10.5)

## 2018-06-05 LAB — LDL CHOLESTEROL, DIRECT: Direct LDL: 157 mg/dL

## 2018-06-05 LAB — TSH: TSH: 3.88 u[IU]/mL (ref 0.35–4.50)

## 2018-06-05 LAB — HEMOGLOBIN A1C: Hgb A1c MFr Bld: 6.4 % (ref 4.6–6.5)

## 2018-06-05 LAB — T4, FREE: FREE T4: 0.73 ng/dL (ref 0.60–1.60)

## 2018-06-06 ENCOUNTER — Encounter: Payer: Self-pay | Admitting: Internal Medicine

## 2018-06-06 MED ORDER — ATORVASTATIN CALCIUM 40 MG PO TABS: 40.0000 mg | ORAL_TABLET | Freq: Every day | ORAL | 0 refills | Status: DC

## 2018-06-07 MED ORDER — LISINOPRIL-HYDROCHLOROTHIAZIDE 10-12.5 MG PO TABS: 1.0000 | ORAL_TABLET | Freq: Every day | ORAL | 5 refills | Status: DC

## 2018-06-10 ENCOUNTER — Encounter: Payer: Self-pay | Admitting: Internal Medicine

## 2018-06-10 DIAGNOSIS — R51 Headache: Secondary | ICD-10-CM

## 2018-06-10 NOTE — Assessment & Plan Note (Signed)
Stable off meds °Will monitor °

## 2018-06-10 NOTE — Assessment & Plan Note (Signed)
CMET and Lipid profile today Encouraged him to consume a low fat diet Continue Atorvastatin, will adjust if needed based on labs 

## 2018-06-10 NOTE — Assessment & Plan Note (Signed)
Encouraged weight loss Continue Ibuprofen as needed

## 2018-06-10 NOTE — Assessment & Plan Note (Signed)
Reasonable on Lisinopril HCT Reinforced DASH diet and exercise for weight loss CBC and CMET today

## 2018-06-10 NOTE — Assessment & Plan Note (Signed)
Restart Omeprazole, eRx sent to pharmacy Encouraged avoidance of foods that trigger reflux, exercise for weight loss

## 2018-06-10 NOTE — Assessment & Plan Note (Signed)
Continue Sildenafil prn 

## 2018-06-10 NOTE — Assessment & Plan Note (Signed)
He will continue to follow with urology, meds per urology

## 2018-06-10 NOTE — Assessment & Plan Note (Signed)
He does not want to wean off Nortriptyline at this time Will monitor

## 2018-06-11 ENCOUNTER — Ambulatory Visit
Admission: EM | Admit: 2018-06-11 | Discharge: 2018-06-11 | Disposition: A | Discharge status: Home / Self Care | Payer: 59 | Attending: Family Medicine | Admitting: Family Medicine

## 2018-06-11 ENCOUNTER — Other Ambulatory Visit: Payer: Self-pay

## 2018-06-11 ENCOUNTER — Ambulatory Visit (INDEPENDENT_AMBULATORY_CARE_PROVIDER_SITE_OTHER)
Admit: 2018-06-11 | Discharge: 2018-06-11 | Disposition: A | Discharge status: Home / Self Care | Payer: 59 | Attending: Family Medicine | Admitting: Family Medicine

## 2018-06-11 ENCOUNTER — Ambulatory Visit: Payer: Self-pay | Admitting: *Deleted

## 2018-06-11 DIAGNOSIS — S0081XA Abrasion of other part of head, initial encounter: Secondary | ICD-10-CM | POA: Diagnosis not present

## 2018-06-11 DIAGNOSIS — K219 Gastro-esophageal reflux disease without esophagitis: Secondary | ICD-10-CM | POA: Diagnosis not present

## 2018-06-11 DIAGNOSIS — R1031 Right lower quadrant pain: Secondary | ICD-10-CM | POA: Diagnosis not present

## 2018-06-11 DIAGNOSIS — N529 Male erectile dysfunction, unspecified: Secondary | ICD-10-CM | POA: Diagnosis not present

## 2018-06-11 DIAGNOSIS — F411 Generalized anxiety disorder: Secondary | ICD-10-CM | POA: Insufficient documentation

## 2018-06-11 DIAGNOSIS — R1030 Lower abdominal pain, unspecified: Secondary | ICD-10-CM | POA: Diagnosis not present

## 2018-06-11 DIAGNOSIS — Z79899 Other long term (current) drug therapy: Secondary | ICD-10-CM | POA: Diagnosis not present

## 2018-06-11 DIAGNOSIS — S0091XA Abrasion of unspecified part of head, initial encounter: Secondary | ICD-10-CM | POA: Diagnosis not present

## 2018-06-11 DIAGNOSIS — I10 Essential (primary) hypertension: Secondary | ICD-10-CM | POA: Diagnosis not present

## 2018-06-11 DIAGNOSIS — X58XXXA Exposure to other specified factors, initial encounter: Secondary | ICD-10-CM | POA: Diagnosis not present

## 2018-06-11 DIAGNOSIS — E785 Hyperlipidemia, unspecified: Secondary | ICD-10-CM | POA: Insufficient documentation

## 2018-06-11 DIAGNOSIS — N2889 Other specified disorders of kidney and ureter: Secondary | ICD-10-CM

## 2018-06-11 DIAGNOSIS — W19XXXA Unspecified fall, initial encounter: Secondary | ICD-10-CM | POA: Diagnosis not present

## 2018-06-11 LAB — CBC WITH DIFFERENTIAL/PLATELET
Basophils Absolute: 0 10*3/uL (ref 0–0.1)
Basophils Relative: 0 %
Eosinophils Absolute: 0.1 10*3/uL (ref 0–0.7)
Eosinophils Relative: 1 %
HEMATOCRIT: 43.7 % (ref 40.0–52.0)
HEMOGLOBIN: 14.7 g/dL (ref 13.0–18.0)
LYMPHS ABS: 2.2 10*3/uL (ref 1.0–3.6)
LYMPHS PCT: 19 %
MCH: 26.9 pg (ref 26.0–34.0)
MCHC: 33.7 g/dL (ref 32.0–36.0)
MCV: 80 fL (ref 80.0–100.0)
Monocytes Absolute: 0.8 10*3/uL (ref 0.2–1.0)
Monocytes Relative: 7 %
NEUTROS ABS: 8.4 10*3/uL — AB (ref 1.4–6.5)
NEUTROS PCT: 73 %
Platelets: 298 10*3/uL (ref 150–440)
RBC: 5.46 MIL/uL (ref 4.40–5.90)
RDW: 14.8 % — ABNORMAL HIGH (ref 11.5–14.5)
WBC: 11.6 10*3/uL — ABNORMAL HIGH (ref 3.8–10.6)

## 2018-06-11 LAB — COMPREHENSIVE METABOLIC PANEL
ALT: 42 U/L (ref 0–44)
ANION GAP: 11 (ref 5–15)
AST: 38 U/L (ref 15–41)
Albumin: 4.3 g/dL (ref 3.5–5.0)
Alkaline Phosphatase: 92 U/L (ref 38–126)
BILIRUBIN TOTAL: 0.5 mg/dL (ref 0.3–1.2)
BUN: 12 mg/dL (ref 6–20)
CALCIUM: 9.4 mg/dL (ref 8.9–10.3)
CHLORIDE: 100 mmol/L (ref 98–111)
CO2: 28 mmol/L (ref 22–32)
CREATININE: 1.31 mg/dL — AB (ref 0.61–1.24)
GFR calc non Af Amer: >60 mL/min (ref >60)
Glucose, Bld: 116 mg/dL — ABNORMAL HIGH (ref 70–99)
Potassium: 3.8 mmol/L (ref 3.5–5.1)
Sodium: 139 mmol/L (ref 135–145)
Total Protein: 8.3 g/dL — ABNORMAL HIGH (ref 6.5–8.1)

## 2018-06-11 LAB — URINALYSIS, COMPLETE (UACMP) WITH MICROSCOPIC
BILIRUBIN URINE: NEGATIVE
Bacteria, UA: NONE SEEN
Glucose, UA: NEGATIVE mg/dL
Hgb urine dipstick: NEGATIVE
Ketones, ur: NEGATIVE mg/dL
Leukocytes, UA: NEGATIVE
NITRITE: NEGATIVE
PH: 5.5 (ref 5.0–8.0)
Protein, ur: NEGATIVE mg/dL
SPECIFIC GRAVITY, URINE: 1.025 (ref 1.005–1.030)
Squamous Epithelial / LPF: NONE SEEN (ref 0–5)

## 2018-06-11 MED ORDER — IOPAMIDOL (ISOVUE-300) INJECTION 61%
150.0000 mL | Freq: Once | INTRAVENOUS | Status: AC | PRN
  Administered 2018-06-11: 115 mL via INTRAVENOUS

## 2018-06-11 NOTE — ED Notes (Signed)
Obtained prior authorization for CT [phone]. Approval #[id number] from [facility]

## 2018-06-11 NOTE — Discharge Instructions (Addendum)
Rest. Drink plenty of fluids. Gradual diet increase.   Follow up with Gastroenterology this week for continued pain.   Follow up with your primary care physician this week. Return to Urgent care or Emergency room for new or worsening concerns.

## 2018-06-11 NOTE — ED Provider Notes (Addendum)
[facility] ____________________________________________  Time seen: Approximately 10:32 AM  I have reviewed the triage vital signs and the nursing notes.  HISTORY  Chief Complaint Abdominal Pain  HPI Kenneth Sherman is a 47 y.o. male presenting for evaluation of lower abdominal pain with acute onset this morning.  Patient reports that he woke up at 4 AM with mid lower underneath bellybutton pain that he states was greater than 10.  States he felt like maybe need to have a bowel movement.  Reports he went to the bathroom, and states that the pain made him lean forward and fall off the toilet.  States that he did hit his head on the rug, but no loss of consciousness.  States that he then afterwards had a small bowel movement with some diarrhea.  States shortly after he then had another bowel movement and normal-appearing stool, but states it was a very minimal amount of pinkish blood on the tissue when wiping.  Denies any abnormal colored stool or dark stool.  Denies any other blood noted on tissue, and states no other bleeding or bruising.  No associated vomiting or nausea.  States walking through his hallway the pain then hit again and he fell to his knees.  States he then sat and  rested on the couch and the pain gradually slightly improved.  States pain is now a 5-6 and has been constant at that since.    Denies worsening or alleviating factors.  Has not eaten this morning.  Denies bulge, other trauma, penile or testicular pain or swelling.  Denies dysuria, flank pain, fevers, recent food changes, recent sickness.  Denies any previous abdominal surgeries.  States he did have kidney stones approximate 25 years ago but no other abdominal history.  Denies pain radiation.  No back pain, upper stomach pain, chest pain, shortness of breath, dysuria, rash.  Denies any headache, vision changes, dizziness, nausea, neck or back pain.  Denies recent sickness. Denies recent antibiotic use.  Reports  tetanus immunization up-to-date.  Jearld Fenton, NP: PCP   Past Medical History:  Diagnosis Date  . Arthritis   . Heartburn   . HLD (hyperlipidemia)   . Hypertension    no meds  . Hypogonadism in male   . Right knee DJD   . Thyroid disease     Patient Active Problem List   Diagnosis Date Noted  . Frequent headaches 06/10/2018  . GERD (gastroesophageal reflux disease) 11/24/2015  . Generalized anxiety disorder 11/28/2013  . ED (erectile dysfunction) 11/28/2013  . HLD (hyperlipidemia) 02/12/2013  . Hypotestosteronism 02/12/2013  . Hypertension 11/12/2012  . Right knee DJD 11/12/2012    Past Surgical History:  Procedure Laterality Date  . BICEPS TENDON REPAIR  05/2015  . KNEE ARTHROSCOPY W/ MENISCECTOMY  2008   left  . KNEE ARTHROSCOPY W/ MENISCECTOMY  2011   right  . PARTIAL KNEE ARTHROPLASTY  11/29/2012   Procedure: UNICOMPARTMENTAL KNEE;  Surgeon: Lorn Junes, MD;  Location: [place];  Service: Orthopedics;  Laterality: Right;     No current facility-administered medications for this encounter.   Current Outpatient Medications:  .  atorvastatin (LIPITOR) 40 MG tablet, Take 1 tablet (40 mg total) by mouth daily., Disp: 90 tablet, Rfl: 0 .  levothyroxine (SYNTHROID, LEVOTHROID) 50 MCG tablet, TAKE 1 TABLET (50 MCG TOTAL) BY MOUTH DAILY BEFORE BREAKFAST., Disp: 30 tablet, Rfl: 2 .  lisinopril-hydrochlorothiazide (PRINZIDE,ZESTORETIC) 10-12.5 MG tablet, Take 1 tablet by mouth daily., Disp: 30 tablet, Rfl: 5 .  nortriptyline (PAMELOR) 50 MG capsule, TAKE 1 CAPSULE (50 MG TOTAL) BY MOUTH AT BEDTIME., Disp: 30 capsule, Rfl: 1 .  omeprazole (PRILOSEC) 20 MG capsule, Take 1 capsule (20 mg total) by mouth daily., Disp: 30 capsule, Rfl: 1 .  sildenafil (REVATIO) 20 MG tablet, TAKE 3 TO 5 TABLETS TWO HOURS BEFORE INTERCOUSE ON AN EMPTY STOMACH. DO NOT TAKE WITH NITRATES., Disp: 50 tablet, Rfl: 0  Allergies Patient has no known allergies.  Family History  Problem Relation  Age of Onset  . Heart attack Mother   . Hypertension Mother   . Hypertension Father   . Heart attack Father   . Hypertension Brother   . Kidney disease Neg Hx   . Prostate cancer Neg Hx   . Bladder Cancer Neg Hx   Mother and father deceased.  Social History Social History   Tobacco Use  . Smoking status: Never Smoker  . Smokeless tobacco: Never Used  Substance Use Topics  . Alcohol use: Yes    Alcohol/week: 1.2 oz    Types: 1 Glasses of wine, 1 Cans of beer per week    Comment: occasionally  . Drug use: No    Review of Systems Constitutional: No fever/chills Cardiovascular: Denies chest pain. Respiratory: Denies shortness of breath. Gastrointestinal: AS above.  Genitourinary: Negative for dysuria. Musculoskeletal: Negative for back pain. Skin: Negative for rash. Neurological: Negative for headaches, focal weakness or numbness.   ____________________________________________   PHYSICAL EXAM:  VITAL SIGNS: ED Triage Vitals  Enc Vitals Group     BP 06/11/18 0945 136/90     Pulse Rate 06/11/18 0945 100     Resp 06/11/18 0945 18     Temp 06/11/18 0945 97.9 F (36.6 C)     Temp Source 06/11/18 0945 Oral     SpO2 06/11/18 0945 97 %     Weight 06/11/18 0943 289 lb (131.1 kg)     Height 06/11/18 0943 5\' 11"  (1.803 m)     Head Circumference --      Peak Flow --      Pain Score 06/11/18 0942 6     Pain Loc --      Pain Edu? --      Excl. in [place]? --     Constitutional: Alert and oriented. Well appearing and in no acute distress. Eyes: Conjunctivae are normal. PERRL. EOMI. ENT      Head: Normocephalic and atraumatic. Cardiovascular: Normal rate, regular rhythm. Grossly normal heart sounds.  Good peripheral circulation. Respiratory: Normal respiratory effort without tachypnea nor retractions. Breath sounds are clear and equal bilaterally. No wheezes, rales, rhonchi. Gastrointestinal: Obese abdomen.  Hypoactive bowel sounds.  Moderate midline lower abdominal  tenderness, mild left lower quadrant abdominal tenderness, mild to moderate right lower quadrant abdominal tenderness.  Non-guarding.  No CVA tenderness. Musculoskeletal:No midline cervical, thoracic or lumbar tenderness to palpation Neurologic:  Normal speech and language. No gross focal neurologic deficits are appreciated. Speech is normal. No gait instability.  Skin:  Skin is warm, dry. Mild superficial abrasion right forehead.  Psychiatric: Mood and affect are normal. Speech and behavior are normal. Patient exhibits appropriate insight and judgment   ___________________________________________   LABS (all labs ordered are listed, but only abnormal results are displayed)  Labs Reviewed  CBC WITH DIFFERENTIAL/PLATELET - Abnormal; Notable for the following components:      Result Value   WBC 11.6 (*)    RDW 14.8 (*)    Neutro Abs 8.4 (*)  All other components within normal limits  COMPREHENSIVE METABOLIC PANEL - Abnormal; Notable for the following components:   Glucose, Bld 116 (*)    Creatinine, Ser 1.31 (*)    Total Protein 8.3 (*)    All other components within normal limits  URINALYSIS, COMPLETE (UACMP) WITH MICROSCOPIC    RADIOLOGY  Ct Abdomen Pelvis W Contrast  Result Date: 06/11/2018 CLINICAL DATA:  Right lower quadrant pain since this morning. EXAM: CT ABDOMEN AND PELVIS WITH CONTRAST TECHNIQUE: Multidetector CT imaging of the abdomen and pelvis was performed using the standard protocol following bolus administration of intravenous contrast. CONTRAST:  142mL ISOVUE-300 IOPAMIDOL (ISOVUE-300) INJECTION 61% COMPARISON:  None. FINDINGS: Lower chest: No acute abnormality. Hepatobiliary: No focal liver abnormality is seen. Diffuse low attenuation of the liver as can be seen with hepatic steatosis. No gallstones, gallbladder wall thickening, or biliary dilatation. Pancreas: Unremarkable. No pancreatic ductal dilatation or surrounding inflammatory changes. Spleen: Normal in size  without focal abnormality. Adrenals/Urinary Tract: Adrenal glands are unremarkable. No urolithiasis or obstructive uropathy. 7 mm exophytic left interpolar renal mass measuring 77 Hounsfield units on the nephrographic phase and 52 Hounsfield units on delayed imaging. Normal bladder. Stomach/Bowel: Stomach is within normal limits. Appendix appears normal. No evidence of bowel wall thickening, distention, or inflammatory changes. Vascular/Lymphatic: No significant vascular findings are present. No enlarged abdominal or pelvic lymph nodes. Reproductive: Prostate is unremarkable. Other: No abdominal wall hernia or abnormality. No abdominopelvic ascites. Musculoskeletal: No acute or significant osseous findings. IMPRESSION: 1. No acute abdominal or pelvic pathology. 2. Hepatic steatosis. 3. Indeterminate 7 mm exophytic left interpolar renal mass. Recommend further evaluation with a renal mass protocol MRI or CT of the abdomen. Electronically Signed   By: Kathreen Devoid   On: 06/11/2018 12:53   ____________________________________________   PROCEDURES Procedures     INITIAL IMPRESSION / ASSESSMENT AND PLAN / ED COURSE  Pertinent labs & imaging results that were available during my care of the patient were reviewed by me and considered in my medical decision making (see chart for details).  Overall well-appearing patient.  Acute onset of severe abdominal pain this morning waking him from his sleep.  2 falls associated with pain this morning, no focal neurological deficit, suspect no other injury due to fall.  Abdominal pain continues.  Discussed multiple differentials including diverticulitis, viral illness, musculoskeletal strain, and appendicitis.  Will evaluate labs as well as CT abdomen and pelvis.  Recommend rectal and stool exam, patient declined rectal exam.  No bowel movement in urgent care. Discussed and reviewed lab results with patient in detail.  Awaiting CT.  CT result as above per radiologist.   Copy of CT report also given to patient.  CT per radiologist no acute abdominal or pelvic pathology, hepatic steatosis, 7 mm renal mass, and discussed in detail with patient to follow-up with his primary and urology.  However discussed with patient in detail unclear cause of abdominal pain.  Recommend rest and supportive care, gradual intake increase of food and fluids.  Encouraged PCP and GI follow-up this week.  Discussed strict follow-up and return parameters.Patient verbalized understanding and agreed to plan.   ____________________________________________   FINAL CLINICAL IMPRESSION(S) / ED DIAGNOSES  Final diagnoses:  Lower abdominal pain  Abrasion of forehead, initial encounter     ED Discharge Orders    None       Note: This dictation was prepared with Dragon dictation along with smaller phrase technology. Any transcriptional errors that result from this process  are unintentional.         Marylene Land, NP 06/11/18 1332

## 2018-06-11 NOTE — Telephone Encounter (Signed)
Patient is calling to report that he woke with severe abdominal pain this morning at 4:00am. He went to the bathroom at which time the pain was so bad he fell off the toilet and hit his he on the floor. He did have a BM. He was in the hall and had to go to the floor again with pain. He was able to sit on the couch and the pain became tolerable. He was able to take a shower- he did have another small BM and he noticed a small amount of blood on the tissue when he wiped.Patient is at work now in [employer]- PCP does not have morning appointment available patient advised UC/ED. He is going to UC in [place] and will follow up from there. Reason for Disposition . [1] MILD-MODERATE pain AND [2] constant AND [3] present > 2 hours  Answer Assessment - Initial Assessment Questions 1. LOCATION: "Where does it hurt?"      Below bellybutton 2. RADIATION: "Does the pain shoot anywhere else?" (e.g., chest, back)     No- centrally located 3. ONSET: "When did the pain begin?" (Minutes, hours or days ago)      4:00am this morning 4. SUDDEN: "Gradual or sudden onset?"     Sudden onset 5. PATTERN "Does the pain come and go, or is it constant?"    - If constant: "Is it getting better, staying the same, or worsening?"      (Note: Constant means the pain never goes away completely; most serious pain is constant and it progresses)     - If intermittent: "How long does it last?" "Do you have pain now?"     (Note: Intermittent means the pain goes away completely between bouts)     Constant- sharp pain when started- now more of dull/aching pain- not as sharp as it was at 4:00 am 6. SEVERITY: "How bad is the pain?"  (e.g., Scale 1-10; mild, moderate, or severe)    - MILD (1-3): doesn't interfere with normal activities, abdomen soft and not tender to touch     - MODERATE (4-7): interferes with normal activities or awakens from sleep, tender to touch     - SEVERE (8-10): excruciating pain, doubled over, unable to do any  normal activities       Now- 5-6 this morning it was 10+ 7. RECURRENT SYMPTOM: "Have you ever had this type of abdominal pain before?" If so, ask: "When was the last time?" and "What happened that time?"      no 8. CAUSE: "What do you think is causing the abdominal pain?"     no 9. RELIEVING/AGGRAVATING FACTORS: "What makes it better or worse?" (e.g., movement, antacids, bowel movement)     Sitting- waited it out- patient had clump of stool with watery discharge after.This morning patient felt he needed to have BM again and small amount came out- but when wiped he saw blood on tissue- very faint on tissue.  10. OTHER SYMPTOMS: "Has there been any vomiting, diarrhea, constipation, or urine problems?"       Pain was so severe that patient had falling episodes- he fell off toilet and hit head on floor and went to knees in hallway.  Answer Assessment - Initial Assessment Questions 1. MECHANISM: "How did the injury happen?" For falls, ask: "What height did you fall from?" and "What surface did you fall against?"      Patient fell from toilet during episode of pain- bathroom floor with rug on it 2.  ONSET: "When did the injury happen?" (Minutes or hours ago)      4:00 am 3. NEUROLOGIC SYMPTOMS: "Was there any loss of consciousness?" "Are there any other neurological symptoms?"      No/ no 4. MENTAL STATUS: "Does the person know who he is, who you are, and where he is?"      Patient is aware of who he is- he is presently at work 5. LOCATION: "What part of the head was hit?"      Forehead above R eye 6. SCALP APPEARANCE: "What does the scalp look like? Is it bleeding now?" If so, ask: "Is it difficult to stop?"      rugburn- scrape- no bleeding 7. SIZE: For cuts, bruises, or swelling, ask: "How large is it?" (e.g., inches or centimeters)      Size of quarter 8. PAIN: "Is there any pain?" If so, ask: "How bad is it?"  (e.g., Scale 1-10; or mild, moderate, severe)     Slight pain- 1 9. TETANUS: For  any breaks in the skin, ask: "When was the last tetanus booster?"     Yes- current 10. OTHER SYMPTOMS: "Do you have any other symptoms?" (e.g., neck pain, vomiting)       Neck is sore- no severe pain 11. PREGNANCY: "Is there any chance you are pregnant?" "When was your last menstrual period?"       n/a  Protocols used: ABDOMINAL PAIN - MALE-A-AH, HEAD INJURY-A-AH

## 2018-06-11 NOTE — ED Triage Notes (Signed)
Patient complains of abdominal pain that has been severe since am that had eased off some. Patient states that he did have a BM around 530 am and again at 545 am. Patient states that he constantly feels like he has to use the restroom. Patient states that he fell off the toilet and hit his head on the floor around 4. Patient states that pain is below his belly button. Denies nausea or vomiting.

## 2018-06-12 ENCOUNTER — Telehealth: Payer: Self-pay

## 2018-06-12 NOTE — Telephone Encounter (Signed)
Spoke to pt. He has an appt set up with regina already to discuss options.

## 2018-06-12 NOTE — Telephone Encounter (Signed)
I did review the CT scan and it didn't show any GI abnormalities. There was something on the kidney that may need further imaging (but not clearly was related to his pain). Not sure GI evaluation is really needed. Should probably set up here first----next week if can wait for [facility] or see if someone can see him on Friday---to decide what the next best steps are

## 2018-06-12 NOTE — Telephone Encounter (Signed)
Copied from [place] [id number]. Topic: Referral - Request >> Jun 12, 2018 10:49 AM Marja Kays F wrote: Pt tried to see Webb Silversmith on 06/12/18 and she was unavailable and a nurse recommend that he go to [facility] urgent care due to lower abdominal pain and a ct scan was done and they recommend that the patient  Get an appt with a gastrointerologist but he needs a referral from his PCP [facility] recommended Dr. Vicente Males at [facility] [phone]    Best number [phone]

## 2018-06-17 ENCOUNTER — Ambulatory Visit: Payer: 59 | Admitting: Internal Medicine

## 2018-06-18 ENCOUNTER — Encounter: Payer: Self-pay | Admitting: Internal Medicine

## 2018-06-18 ENCOUNTER — Ambulatory Visit (INDEPENDENT_AMBULATORY_CARE_PROVIDER_SITE_OTHER): Payer: 59 | Admitting: Internal Medicine

## 2018-06-18 VITALS — BP 130/84 | HR 94 | Temp 97.9°F | Wt 290.0 lb

## 2018-06-18 DIAGNOSIS — N2889 Other specified disorders of kidney and ureter: Secondary | ICD-10-CM | POA: Diagnosis not present

## 2018-06-18 DIAGNOSIS — R1031 Right lower quadrant pain: Secondary | ICD-10-CM | POA: Diagnosis not present

## 2018-06-18 DIAGNOSIS — K921 Melena: Secondary | ICD-10-CM

## 2018-06-18 LAB — COMPREHENSIVE METABOLIC PANEL
ALBUMIN: 4.1 g/dL (ref 3.5–5.2)
ALK PHOS: 74 U/L (ref 39–117)
ALT: 24 U/L (ref 0–53)
AST: 17 U/L (ref 0–37)
BUN: 10 mg/dL (ref 6–23)
CO2: 35 mEq/L — ABNORMAL HIGH (ref 19–32)
CREATININE: 1.18 mg/dL (ref 0.40–1.50)
Calcium: 9.3 mg/dL (ref 8.4–10.5)
Chloride: 99 mEq/L (ref 96–112)
GFR: 70.05 mL/min (ref >60.00)
GLUCOSE: 155 mg/dL — AB (ref 70–99)
Potassium: 4 mEq/L (ref 3.5–5.1)
SODIUM: 139 meq/L (ref 135–145)
TOTAL PROTEIN: 7.3 g/dL (ref 6.0–8.3)
Total Bilirubin: 0.3 mg/dL (ref 0.2–1.2)

## 2018-06-18 LAB — CBC
HCT: 38.4 % — ABNORMAL LOW (ref 39.0–52.0)
Hemoglobin: 13.3 g/dL (ref 13.0–17.0)
MCHC: 34.6 g/dL (ref 30.0–36.0)
MCV: 80.5 fl (ref 78.0–100.0)
Platelets: 313 10*3/uL (ref 150.0–400.0)
RBC: 4.77 Mil/uL (ref 4.22–5.81)
RDW: 14.2 % (ref 11.5–15.5)
WBC: 8.1 10*3/uL (ref 4.0–10.5)

## 2018-06-18 LAB — AMYLASE: AMYLASE: 57 U/L (ref 27–131)

## 2018-06-18 LAB — LIPASE: Lipase: 21 U/L (ref 11.0–59.0)

## 2018-06-18 NOTE — Patient Instructions (Signed)

## 2018-06-18 NOTE — Addendum Note (Signed)
Addended by: Ellamae Sia on: 06/18/2018 10:42 AM   Modules accepted: Orders

## 2018-06-18 NOTE — Progress Notes (Signed)
Subjective:    Patient ID: Kenneth Sherman, male    DOB: Nov 06, 1971, 47 y.o.   MRN: [id number]  HPI  Pt presents to the clinic today for ER follow up. He went to the ER 7/2 with c/o lower abdominal pain that woke him up out of his sleep. He reports subsequent bowel movements, with pink blood with wiping. He did fall and sustain an abrasion to his forehead. CT scan of the abdomen showed:  IMPRESSION: 1. No acute abdominal or pelvic pathology. 2. Hepatic steatosis. 3. Indeterminate 7 mm exophytic left interpolar renal mass. Recommend further evaluation with a renal mass protocol MRI or CT of the abdomen.  The cause of his abdominal pain was not identified. He was advised to consume a clear liquid/bland diet. If symptoms did not resolve to follow up with urology, GI and PCP.  Since that time, he continues to have RLQ pain. The pain worsens throughout the day. His appetite is decreased. His stools are soft, no diarrhea. He had blood in his stool for 3 days but has not seen any since that time. He denies nausea, vomiting. He has not taken anything OTC for his symptoms.  Review of Systems  Past Medical History:  Diagnosis Date  . Arthritis   . Heartburn   . HLD (hyperlipidemia)   . Hypertension    no meds  . Hypogonadism in male   . Right knee DJD   . Thyroid disease     Current Outpatient Medications  Medication Sig Dispense Refill  . atorvastatin (LIPITOR) 40 MG tablet Take 1 tablet (40 mg total) by mouth daily. 90 tablet 0  . levothyroxine (SYNTHROID, LEVOTHROID) 50 MCG tablet TAKE 1 TABLET (50 MCG TOTAL) BY MOUTH DAILY BEFORE BREAKFAST. 30 tablet 2  . lisinopril-hydrochlorothiazide (PRINZIDE,ZESTORETIC) 10-12.5 MG tablet Take 1 tablet by mouth daily. 30 tablet 5  . nortriptyline (PAMELOR) 50 MG capsule TAKE 1 CAPSULE (50 MG TOTAL) BY MOUTH AT BEDTIME. 30 capsule 1  . omeprazole (PRILOSEC) 20 MG capsule Take 1 capsule (20 mg total) by mouth daily. 30 capsule 1  . sildenafil  (REVATIO) 20 MG tablet TAKE 3 TO 5 TABLETS TWO HOURS BEFORE INTERCOUSE ON AN EMPTY STOMACH. DO NOT TAKE WITH NITRATES. 50 tablet 0   No current facility-administered medications for this visit.     No Known Allergies  Family History  Problem Relation Age of Onset  . Heart attack Mother   . Hypertension Mother   . Hypertension Father   . Heart attack Father   . Hypertension Brother   . Kidney disease Neg Hx   . Prostate cancer Neg Hx   . Bladder Cancer Neg Hx     Social History   Socioeconomic History  . Marital status: Married    Spouse name: Not on file  . Number of children: Not on file  . Years of education: 13  . Highest education level: Not on file  Occupational History  . Occupation: CAD Games developer: [employer]  . Financial resource strain: Not on file  . Food insecurity:    Worry: Not on file    Inability: Not on file  . Transportation needs:    Medical: Not on file    Non-medical: Not on file  Tobacco Use  . Smoking status: Never Smoker  . Smokeless tobacco: Never Used  Substance and Sexual Activity  . Alcohol use: Yes    Alcohol/week: 1.2 oz  Types: 1 Glasses of wine, 1 Cans of beer per week    Comment: occasionally  . Drug use: No  . Sexual activity: Yes    Birth control/protection: Condom  Lifestyle  . Physical activity:    Days per week: Not on file    Minutes per session: Not on file  . Stress: Not on file  Relationships  . Social connections:    Talks on phone: Not on file    Gets together: Not on file    Attends religious service: Not on file    Active member of club or organization: Not on file    Attends meetings of clubs or organizations: Not on file    Relationship status: Not on file  . Intimate partner violence:    Fear of current or ex partner: Not on file    Emotionally abused: Not on file    Physically abused: Not on file    Forced sexual activity: Not on file  Other Topics Concern  . Not on file    Social History Narrative   Regular exercise-no   Caffeine Use-yes     Constitutional: Denies fever, malaise, fatigue, headache or abrupt weight changes.  Respiratory: Denies difficulty breathing, shortness of breath, cough or sputum production.   Cardiovascular: Denies chest pain, chest tightness, palpitations or swelling in the hands or feet.  Gastrointestinal: Pt reports abdominal pain, soft stool. Denies bloating, constipation, diarrhea or blood in the stool.  GU: Denies urgency, frequency, pain with urination, burning sensation, blood in urine, odor or discharge. Musculoskeletal: Denies decrease in range of motion, difficulty with gait, muscle pain or joint pain and swelling.  Skin: Denies redness, rashes, lesions or ulcercations.    No other specific complaints in a complete review of systems (except as listed in HPI above).     Objective:   Physical Exam  BP 130/84   Pulse 94   Temp 97.9 F (36.6 C) (Oral)   Wt 290 lb (131.5 kg)   SpO2 98%   BMI 40.45 kg/m  Wt Readings from Last 3 Encounters:  06/18/18 290 lb (131.5 kg)  06/11/18 289 lb (131.1 kg)  06/04/18 289 lb (131.1 kg)    General: Appears his stated age, obese in NAD. Skin: Warm, dry and intact. No rashes noted. Abdomen: Soft and tender in the RLQ. Normal bowel sounds. No distention or masses noted. Neurological: Alert and oriented.   BMET    Component Value Date/Time   NA 139 06/11/2018 1018   K 3.8 06/11/2018 1018   CL 100 06/11/2018 1018   CO2 28 06/11/2018 1018   GLUCOSE 116 (H) 06/11/2018 1018   BUN 12 06/11/2018 1018   CREATININE 1.31 (H) 06/11/2018 1018   CREATININE 1.22 08/04/2016 1525   CALCIUM 9.4 06/11/2018 1018   GFRNONAA >60 06/11/2018 1018   GFRAA >60 06/11/2018 1018    Lipid Panel     Component Value Date/Time   CHOL 223 (H) 06/04/2018 1543   TRIG 236.0 (H) 06/04/2018 1543   HDL 41.60 06/04/2018 1543   CHOLHDL 5 06/04/2018 1543   VLDL 47.2 (H) 06/04/2018 1543   LDLCALC 108  (H) 02/09/2016 0913    CBC    Component Value Date/Time   WBC 11.6 (H) 06/11/2018 1018   RBC 5.46 06/11/2018 1018   HGB 14.7 06/11/2018 1018   HGB 14.8 12/07/2017 0847   HCT 43.7 06/11/2018 1018   HCT 43.4 12/07/2017 0847   PLT 298 06/11/2018 1018   MCV 80.0  06/11/2018 1018   MCH 26.9 06/11/2018 1018   MCHC 33.7 06/11/2018 1018   RDW 14.8 (H) 06/11/2018 1018   LYMPHSABS 2.2 06/11/2018 1018   MONOABS 0.8 06/11/2018 1018   EOSABS 0.1 06/11/2018 1018   BASOSABS 0.0 06/11/2018 1018    Hgb A1C Lab Results  Component Value Date   HGBA1C 6.4 06/04/2018            Assessment & Plan:   UC Follow Up for RLQ Pain, Blood in Stool., Left Renal Mass:  UC notes, labs and imaging reviewed Repeat CBC and CMET Add amylase, lipase, GI pathogen panel Continue bland diet for now Referral to GI placed for possible scope MRI abdomen ordered today  Will follow up after labs and xray, return precautions discussed Webb Silversmith, NP

## 2018-06-19 DIAGNOSIS — K921 Melena: Secondary | ICD-10-CM | POA: Diagnosis not present

## 2018-06-19 DIAGNOSIS — R1031 Right lower quadrant pain: Secondary | ICD-10-CM | POA: Diagnosis not present

## 2018-06-19 NOTE — Addendum Note (Signed)
Addended by: Lendon Collar on: 06/19/2018 12:51 PM   Modules accepted: Orders

## 2018-06-21 ENCOUNTER — Encounter: Payer: Self-pay | Admitting: Internal Medicine

## 2018-06-23 ENCOUNTER — Ambulatory Visit
Admission: RE | Admit: 2018-06-23 | Discharge: 2018-06-23 | Disposition: A | Discharge status: Home / Self Care | Payer: 59 | Source: Ambulatory Visit | Attending: Internal Medicine | Admitting: Internal Medicine

## 2018-06-23 DIAGNOSIS — N2889 Other specified disorders of kidney and ureter: Secondary | ICD-10-CM | POA: Diagnosis not present

## 2018-06-23 DIAGNOSIS — R1031 Right lower quadrant pain: Secondary | ICD-10-CM

## 2018-06-23 MED ORDER — GADOBENATE DIMEGLUMINE 529 MG/ML IV SOLN
20.0000 mL | Freq: Once | INTRAVENOUS | Status: AC | PRN
  Administered 2018-06-23: 20 mL via INTRAVENOUS

## 2018-06-24 ENCOUNTER — Ambulatory Visit: Payer: 59 | Admitting: Gastroenterology

## 2018-06-24 ENCOUNTER — Encounter: Payer: Self-pay | Admitting: Internal Medicine

## 2018-06-24 LAB — GASTROINTESTINAL PATHOGEN PANEL PCR
C. difficile Tox A/B, PCR: NOT DETECTED
Campylobacter, PCR: NOT DETECTED
Cryptosporidium, PCR: NOT DETECTED
E COLI (STEC) STX1/STX2, PCR: NOT DETECTED
E coli (ETEC) LT/ST PCR: NOT DETECTED
E coli 0157, PCR: NOT DETECTED
Giardia lamblia, PCR: NOT DETECTED
NOROVIRUS, PCR: NOT DETECTED
ROTAVIRUS, PCR: NOT DETECTED
SALMONELLA, PCR: NOT DETECTED
SHIGELLA, PCR: NOT DETECTED

## 2018-06-29 ENCOUNTER — Other Ambulatory Visit: Payer: Self-pay | Admitting: Internal Medicine

## 2018-07-02 NOTE — Telephone Encounter (Signed)
Last Rx 04/30/2018 #30 1R. Last OV 06/2018-acute. Pls advise

## 2018-08-05 ENCOUNTER — Other Ambulatory Visit: Payer: Self-pay | Admitting: Internal Medicine

## 2018-08-05 DIAGNOSIS — Z0000 Encounter for general adult medical examination without abnormal findings: Secondary | ICD-10-CM

## 2018-08-15 ENCOUNTER — Telehealth: Payer: Self-pay

## 2018-08-15 NOTE — Telephone Encounter (Signed)
Rx for Sildenafil received from [facility], denied until pt comes in for appointment.

## 2018-08-18 ENCOUNTER — Encounter: Payer: Self-pay | Admitting: Internal Medicine

## 2018-08-19 MED ORDER — SILDENAFIL CITRATE 20 MG PO TABS: ORAL_TABLET | ORAL | 2 refills | Status: DC

## 2018-09-05 ENCOUNTER — Other Ambulatory Visit: Payer: Self-pay | Admitting: Internal Medicine

## 2018-12-27 ENCOUNTER — Other Ambulatory Visit: Payer: Self-pay | Admitting: Internal Medicine

## 2018-12-27 DIAGNOSIS — Z0000 Encounter for general adult medical examination without abnormal findings: Secondary | ICD-10-CM

## 2019-01-02 ENCOUNTER — Encounter: Payer: Self-pay | Admitting: Internal Medicine

## 2019-01-30 ENCOUNTER — Other Ambulatory Visit: Payer: Self-pay | Admitting: Internal Medicine

## 2019-04-11 ENCOUNTER — Other Ambulatory Visit: Payer: Self-pay

## 2019-04-11 DIAGNOSIS — Z0000 Encounter for general adult medical examination without abnormal findings: Secondary | ICD-10-CM

## 2019-04-11 MED ORDER — OMEPRAZOLE 20 MG PO CPDR: DELAYED_RELEASE_CAPSULE | ORAL | 1 refills | Status: DC

## 2019-05-03 ENCOUNTER — Other Ambulatory Visit: Payer: Self-pay | Admitting: Internal Medicine

## 2019-05-03 DIAGNOSIS — Z0000 Encounter for general adult medical examination without abnormal findings: Secondary | ICD-10-CM

## 2019-05-26 ENCOUNTER — Encounter: Payer: Self-pay | Admitting: Internal Medicine

## 2019-05-28 ENCOUNTER — Encounter: Payer: Self-pay | Admitting: Internal Medicine

## 2019-05-28 ENCOUNTER — Other Ambulatory Visit: Payer: Self-pay

## 2019-05-28 ENCOUNTER — Ambulatory Visit (INDEPENDENT_AMBULATORY_CARE_PROVIDER_SITE_OTHER): Payer: 59 | Admitting: Internal Medicine

## 2019-05-28 DIAGNOSIS — J329 Chronic sinusitis, unspecified: Secondary | ICD-10-CM

## 2019-05-28 DIAGNOSIS — B9789 Other viral agents as the cause of diseases classified elsewhere: Secondary | ICD-10-CM | POA: Diagnosis not present

## 2019-05-28 NOTE — Progress Notes (Signed)
Virtual Visit via Video Note  I connected with Kenneth Sherman on 05/28/19 at  3:45 PM EDT by a video enabled telemedicine application and verified that I am speaking with the correct person using two identifiers.  Location: Patient: Work Provider: Office   I discussed the limitations of evaluation and management by telemedicine and the availability of in person appointments. The patient expressed understanding and agreed to proceed.  History of Present Illness:  Pt reports facial pressure, nasal congestion, and decreased hearing out of his left ear. He reports this started 3 days ago. He is not blowing anything out of his nose. He denies ear pain, drainage or ringing in the ear. He denies runny nose, ear pain, sore throat, cough or shortness of breath. He denies fever, chills or body aches. He has not taken anything OTC for this. He has not had sick contacts that he is aware of.  Past Medical History:  Diagnosis Date  . Arthritis   . Heartburn   . HLD (hyperlipidemia)   . Hypertension    no meds  . Hypogonadism in male   . Right knee DJD   . Thyroid disease     Current Outpatient Medications  Medication Sig Dispense Refill  . atorvastatin (LIPITOR) 40 MG tablet TAKE 1 TABLET BY MOUTH EVERY DAY 90 tablet 2  . levothyroxine (SYNTHROID, LEVOTHROID) 50 MCG tablet TAKE 1 TABLET (50 MCG TOTAL) BY MOUTH DAILY BEFORE BREAKFAST. 30 tablet 2  . lisinopril-hydrochlorothiazide (ZESTORETIC) 10-12.5 MG tablet Take 1 tablet by mouth daily. MUST SCHEDULE PHYSICAL EXAM 30 tablet 1  . nortriptyline (PAMELOR) 50 MG capsule TAKE 1 CAPSULE (50 MG TOTAL) BY MOUTH AT BEDTIME. 30 capsule 4  . omeprazole (PRILOSEC) 20 MG capsule TAKE 1 CAPSULE BY MOUTH EVERY DAY 30 capsule 1  . sildenafil (REVATIO) 20 MG tablet Take 2-5 tabs as needed for sexual intercourse 50 tablet 2   No current facility-administered medications for this visit.     No Known Allergies  Family History  Problem Relation Age of Onset   . Heart attack Mother   . Hypertension Mother   . Hypertension Father   . Heart attack Father   . Hypertension Brother   . Kidney disease Neg Hx   . Prostate cancer Neg Hx   . Bladder Cancer Neg Hx     Social History   Socioeconomic History  . Marital status: Married    Spouse name: Not on file  . Number of children: Not on file  . Years of education: 50  . Highest education level: Not on file  Occupational History  . Occupation: CAD Games developer: [employer]  . Financial resource strain: Not on file  . Food insecurity    Worry: Not on file    Inability: Not on file  . Transportation needs    Medical: Not on file    Non-medical: Not on file  Tobacco Use  . Smoking status: Never Smoker  . Smokeless tobacco: Never Used  Substance and Sexual Activity  . Alcohol use: Yes    Alcohol/week: 2.0 standard drinks    Types: 1 Glasses of wine, 1 Cans of beer per week    Comment: occasionally  . Drug use: No  . Sexual activity: Yes    Birth control/protection: Condom  Lifestyle  . Physical activity    Days per week: Not on file    Minutes per session: Not on file  . Stress: Not  on file  Relationships  . Social Herbalist on phone: Not on file    Gets together: Not on file    Attends religious service: Not on file    Active member of club or organization: Not on file    Attends meetings of clubs or organizations: Not on file    Relationship status: Not on file  . Intimate partner violence    Fear of current or ex partner: Not on file    Emotionally abused: Not on file    Physically abused: Not on file    Forced sexual activity: Not on file  Other Topics Concern  . Not on file  Social History Narrative   Regular exercise-no   Caffeine Use-yes     Constitutional: Denies fever, malaise, fatigue, headache or abrupt weight changes.  HEENT: Pt reports left ear fullness and nasal congestion. Denies eye pain, eye redness, ear pain,  ringing in the ears, wax buildup, runny nose,, bloody nose, or sore throat. Respiratory: Denies difficulty breathing, shortness of breath, cough or sputum production.   Cardiovascular: Denies chest pain, chest tightness, palpitations or swelling in the hands or feet.  Neurological: Denies dizziness, difficulty with memory, difficulty with speech or problems with balance and coordination.    No other specific complaints in a complete review of systems (except as listed in HPI above).   Wt Readings from Last 3 Encounters:  06/18/18 290 lb (131.5 kg)  06/11/18 289 lb (131.1 kg)  06/04/18 289 lb (131.1 kg)    General: Appears his stated age, obese, in NAD. HEENT: Head: normal shape and size; Eyes: sclera white and EOMs intact;  Pulmonary/Chest: Normal effort. No respiratory distress. Neurological: Alert and oriented.   BMET    Component Value Date/Time   NA 139 06/18/2018 1030   K 4.0 06/18/2018 1030   CL 99 06/18/2018 1030   CO2 35 (H) 06/18/2018 1030   GLUCOSE 155 (H) 06/18/2018 1030   BUN 10 06/18/2018 1030   CREATININE 1.18 06/18/2018 1030   CREATININE 1.22 08/04/2016 1525   CALCIUM 9.3 06/18/2018 1030   GFRNONAA >60 06/11/2018 1018   GFRAA >60 06/11/2018 1018    Lipid Panel     Component Value Date/Time   CHOL 223 (H) 06/04/2018 1543   TRIG 236.0 (H) 06/04/2018 1543   HDL 41.60 06/04/2018 1543   CHOLHDL 5 06/04/2018 1543   VLDL 47.2 (H) 06/04/2018 1543   LDLCALC 108 (H) 02/09/2016 0913    CBC    Component Value Date/Time   WBC 8.1 06/18/2018 1030   RBC 4.77 06/18/2018 1030   HGB 13.3 06/18/2018 1030   HGB 14.8 12/07/2017 0847   HCT 38.4 (L) 06/18/2018 1030   HCT 43.4 12/07/2017 0847   PLT 313.0 06/18/2018 1030   MCV 80.5 06/18/2018 1030   MCH 26.9 06/11/2018 1018   MCHC 34.6 06/18/2018 1030   RDW 14.2 06/18/2018 1030   LYMPHSABS 2.2 06/11/2018 1018   MONOABS 0.8 06/11/2018 1018   EOSABS 0.1 06/11/2018 1018   BASOSABS 0.0 06/11/2018 1018    Hgb  A1C Lab Results  Component Value Date   HGBA1C 6.4 06/04/2018        Assessment and Plan:  Viral Sinusitis:  Start Flonase 1 spray BID x 1 week Can use a Neti Pot OTC  Return precautions discussed  Follow Up Instructions:    I discussed the assessment and treatment plan with the patient. The patient was provided an opportunity to ask  questions and all were answered. The patient agreed with the plan and demonstrated an understanding of the instructions.   The patient was advised to call back or seek an in-person evaluation if the symptoms worsen or if the condition fails to improve as anticipated.     Webb Silversmith, NP

## 2019-05-28 NOTE — Patient Instructions (Signed)

## 2019-05-30 ENCOUNTER — Encounter: Payer: Self-pay | Admitting: Internal Medicine

## 2019-06-02 MED ORDER — PREDNISONE 10 MG PO TABS: ORAL_TABLET | ORAL | 0 refills | Status: DC

## 2019-06-06 ENCOUNTER — Other Ambulatory Visit: Payer: Self-pay | Admitting: Internal Medicine

## 2019-06-06 DIAGNOSIS — Z0000 Encounter for general adult medical examination without abnormal findings: Secondary | ICD-10-CM

## 2019-07-04 ENCOUNTER — Other Ambulatory Visit: Payer: Self-pay | Admitting: Internal Medicine

## 2019-07-04 DIAGNOSIS — Z0000 Encounter for general adult medical examination without abnormal findings: Secondary | ICD-10-CM

## 2019-08-01 ENCOUNTER — Other Ambulatory Visit: Payer: Self-pay | Admitting: Internal Medicine

## 2019-08-01 DIAGNOSIS — Z0000 Encounter for general adult medical examination without abnormal findings: Secondary | ICD-10-CM

## 2019-08-27 ENCOUNTER — Other Ambulatory Visit: Payer: Self-pay | Admitting: Internal Medicine

## 2019-08-27 DIAGNOSIS — Z0000 Encounter for general adult medical examination without abnormal findings: Secondary | ICD-10-CM

## 2019-09-20 ENCOUNTER — Other Ambulatory Visit: Payer: Self-pay | Admitting: Internal Medicine

## 2019-09-25 ENCOUNTER — Other Ambulatory Visit: Payer: Self-pay

## 2019-09-25 ENCOUNTER — Encounter: Payer: Self-pay | Admitting: Internal Medicine

## 2019-09-25 MED ORDER — SILDENAFIL CITRATE 20 MG PO TABS: ORAL_TABLET | ORAL | 0 refills | Status: DC

## 2019-09-25 NOTE — Telephone Encounter (Signed)
Last filled 08/2018 with 1 refill... last OV acute 05/2019... pt told he is overdue for CPE and needs to schedule... please advise

## 2019-09-29 ENCOUNTER — Encounter: Payer: 59 | Admitting: Internal Medicine

## 2019-10-18 ENCOUNTER — Other Ambulatory Visit: Payer: Self-pay | Admitting: Internal Medicine

## 2019-10-30 ENCOUNTER — Encounter: Payer: 59 | Admitting: Internal Medicine

## 2019-11-04 ENCOUNTER — Ambulatory Visit (INDEPENDENT_AMBULATORY_CARE_PROVIDER_SITE_OTHER)
Admission: RE | Admit: 2019-11-04 | Discharge: 2019-11-04 | Disposition: A | Discharge status: Home / Self Care | Payer: 59 | Source: Ambulatory Visit | Attending: Internal Medicine | Admitting: Internal Medicine

## 2019-11-04 ENCOUNTER — Other Ambulatory Visit: Payer: Self-pay

## 2019-11-04 ENCOUNTER — Ambulatory Visit
Admission: RE | Admit: 2019-11-04 | Discharge: 2019-11-04 | Disposition: A | Discharge status: Home / Self Care | Payer: 59 | Source: Ambulatory Visit | Attending: Internal Medicine | Admitting: Internal Medicine

## 2019-11-04 ENCOUNTER — Encounter: Payer: Self-pay | Admitting: Internal Medicine

## 2019-11-04 ENCOUNTER — Ambulatory Visit (INDEPENDENT_AMBULATORY_CARE_PROVIDER_SITE_OTHER): Payer: 59 | Admitting: Internal Medicine

## 2019-11-04 VITALS — BP 118/78 | HR 100 | Temp 98.0°F | Ht 71.0 in | Wt 293.5 lb

## 2019-11-04 DIAGNOSIS — N528 Other male erectile dysfunction: Secondary | ICD-10-CM

## 2019-11-04 DIAGNOSIS — G8929 Other chronic pain: Secondary | ICD-10-CM | POA: Diagnosis not present

## 2019-11-04 DIAGNOSIS — M25562 Pain in left knee: Secondary | ICD-10-CM

## 2019-11-04 DIAGNOSIS — R519 Headache, unspecified: Secondary | ICD-10-CM

## 2019-11-04 DIAGNOSIS — Z0000 Encounter for general adult medical examination without abnormal findings: Secondary | ICD-10-CM

## 2019-11-04 DIAGNOSIS — E039 Hypothyroidism, unspecified: Secondary | ICD-10-CM

## 2019-11-04 DIAGNOSIS — M545 Low back pain, unspecified: Secondary | ICD-10-CM

## 2019-11-04 DIAGNOSIS — E7800 Pure hypercholesterolemia, unspecified: Secondary | ICD-10-CM | POA: Diagnosis not present

## 2019-11-04 DIAGNOSIS — M25561 Pain in right knee: Secondary | ICD-10-CM | POA: Diagnosis not present

## 2019-11-04 DIAGNOSIS — F411 Generalized anxiety disorder: Secondary | ICD-10-CM

## 2019-11-04 DIAGNOSIS — E349 Endocrine disorder, unspecified: Secondary | ICD-10-CM

## 2019-11-04 DIAGNOSIS — K219 Gastro-esophageal reflux disease without esophagitis: Secondary | ICD-10-CM

## 2019-11-04 DIAGNOSIS — I10 Essential (primary) hypertension: Secondary | ICD-10-CM

## 2019-11-04 LAB — COMPREHENSIVE METABOLIC PANEL
ALT: 29 U/L (ref 0–53)
AST: 22 U/L (ref 0–37)
Albumin: 4.1 g/dL (ref 3.5–5.2)
Alkaline Phosphatase: 77 U/L (ref 39–117)
BUN: 12 mg/dL (ref 6–23)
CO2: 31 mEq/L (ref 19–32)
Calcium: 9.4 mg/dL (ref 8.4–10.5)
Chloride: 101 mEq/L (ref 96–112)
Creatinine, Ser: 1.01 mg/dL (ref 0.40–1.50)
GFR: 78.42 mL/min (ref >60.00)
Glucose, Bld: 118 mg/dL — ABNORMAL HIGH (ref 70–99)
Potassium: 4.2 mEq/L (ref 3.5–5.1)
Sodium: 138 mEq/L (ref 135–145)
Total Bilirubin: 0.4 mg/dL (ref 0.2–1.2)
Total Protein: 7.6 g/dL (ref 6.0–8.3)

## 2019-11-04 LAB — LIPID PANEL
Cholesterol: 215 mg/dL — ABNORMAL HIGH (ref 0–200)
HDL: 40.9 mg/dL (ref >39.00)
LDL Cholesterol: 138 mg/dL — ABNORMAL HIGH (ref 0–99)
NonHDL: 174.35
Total CHOL/HDL Ratio: 5
Triglycerides: 182 mg/dL — ABNORMAL HIGH (ref 0.0–149.0)
VLDL: 36.4 mg/dL (ref 0.0–40.0)

## 2019-11-04 LAB — CBC
HCT: 41.6 % (ref 39.0–52.0)
Hemoglobin: 14 g/dL (ref 13.0–17.0)
MCHC: 33.7 g/dL (ref 30.0–36.0)
MCV: 82.3 fl (ref 78.0–100.0)
Platelets: 343 10*3/uL (ref 150.0–400.0)
RBC: 5.06 Mil/uL (ref 4.22–5.81)
RDW: 14.4 % (ref 11.5–15.5)
WBC: 8.1 10*3/uL (ref 4.0–10.5)

## 2019-11-04 LAB — T4, FREE: Free T4: 0.69 ng/dL (ref 0.60–1.60)

## 2019-11-04 LAB — TSH: TSH: 3.71 u[IU]/mL (ref 0.35–4.50)

## 2019-11-04 LAB — HEMOGLOBIN A1C: Hgb A1c MFr Bld: 6.3 % (ref 4.6–6.5)

## 2019-11-04 NOTE — Assessment & Plan Note (Addendum)
  Continues to take Sildenafil Will monitor

## 2019-11-04 NOTE — Assessment & Plan Note (Addendum)
Asymptomatic today Continue Nortriptyline for now Will continue to monitor

## 2019-11-04 NOTE — Assessment & Plan Note (Addendum)
CMET and Lipid profile today Encouraged him to consume a low fat diet Will consider restarting Atorvastatin if needed based on labs

## 2019-11-04 NOTE — Assessment & Plan Note (Addendum)
Controlled on Lisinopril HCT BP today is 118/78 Reinforced DASH diet and exercise for weight loss CMET today Will monitor

## 2019-11-04 NOTE — Assessment & Plan Note (Addendum)
Reports clicking to bilateral knees and central back pain Will order xrays of bilateral knees and lumbar Discussed how weight loss could help improve symptoms Can take Ibuprofen 600 mg or Naproxen 440 mg PO daily as needed CMET today

## 2019-11-04 NOTE — Patient Instructions (Signed)

## 2019-11-04 NOTE — Assessment & Plan Note (Signed)
Controlled on Omeprazole Discussed how weight loss and avoiding triggers could help improve some symptoms CBC and CMET today

## 2019-11-04 NOTE — Assessment & Plan Note (Addendum)
Controlled off meds Support offered today Will monitor

## 2019-11-04 NOTE — Progress Notes (Addendum)
Subjective:    Patient ID: Kenneth Sherman, male    DOB: 1971-11-21, 48 y.o.   MRN: [id number]  HPI  Pt presents to the clinic for his annual exam. He is also due for follow up chronic conditions.  Frequent Headaches: Asymptomatic on Nortriptyline. He is not following with neurology.   ED: He has difficulty maintaining an erection. He takes Sildenafil as prescribed with good results.  GAD: Currently not an issue off meds. He does not see a therapist.   GERD: Triggered by chocolate cake, stress or eating late. He denies breakthrough on Omeprazole. There is no upper GI on file.   Hypothyroidism: He is not taking Levothyroxine anymore, felt like he may not need it.   HTN: His BP today is 118/78. He is taking Lisinopril HCT as prescribed. ECG from 11/2012 reviewed.  HLD: His last LDL was 157 on 05/2018. He stopped taking Atorvastatin at the beginning of this year. He does eat some fried food.  Hypotestosteronism: He is not using Androgel, he follows urology but goes only as needed.  OA: Complains of knee clicking and back pain today. His back has been hurting in the last 6 weeks.  H/o of partial R knee replacement. He takes Ibuprofen as needed with good relief of symptoms.   Flu: never Tetanus: 11/2012 Vision Screening: Annually Dentist: Annually  Diet: He eats meats. He consumes fruits and veggie daily. He also eats fried foods. He drinks mostly water and sodas.  Exercise: none  Review of Systems  Past Medical History:  Diagnosis Date  . Arthritis   . Heartburn   . HLD (hyperlipidemia)   . Hypertension    no meds  . Hypogonadism in male   . Right knee DJD   . Thyroid disease     Current Outpatient Medications  Medication Sig Dispense Refill  . atorvastatin (LIPITOR) 40 MG tablet TAKE 1 TABLET BY MOUTH EVERY DAY 90 tablet 2  . levothyroxine (SYNTHROID, LEVOTHROID) 50 MCG tablet TAKE 1 TABLET (50 MCG TOTAL) BY MOUTH DAILY BEFORE BREAKFAST. 30 tablet 2  .  lisinopril-hydrochlorothiazide (ZESTORETIC) 10-12.5 MG tablet Take 1 tablet by mouth daily. 30 tablet 4  . nortriptyline (PAMELOR) 50 MG capsule TAKE 1 CAPSULE (50 MG TOTAL) BY MOUTH AT BEDTIME. 30 capsule 4  . omeprazole (PRILOSEC) 20 MG capsule Take 1 capsule (20 mg total) by mouth daily. 30 capsule 0  . predniSONE (DELTASONE) 10 MG tablet Take 6 tabs day 1, 5 tabs day 2, 4 tabs day 3, 3 tabs day 4, 2 tabs day 5, 1 tab day 6 21 tablet 0  . sildenafil (REVATIO) 20 MG tablet Take 2-5 tabs as needed for sexual intercourse 50 tablet 0   No current facility-administered medications for this visit.     No Known Allergies  Family History  Problem Relation Age of Onset  . Heart attack Mother   . Hypertension Mother   . Hypertension Father   . Heart attack Father   . Hypertension Brother   . Kidney disease Neg Hx   . Prostate cancer Neg Hx   . Bladder Cancer Neg Hx     Social History   Socioeconomic History  . Marital status: Married    Spouse name: Not on file  . Number of children: Not on file  . Years of education: 75  . Highest education level: Not on file  Occupational History  . Occupation: CAD Games developer: [employer]  .  Financial resource strain: Not on file  . Food insecurity    Worry: Not on file    Inability: Not on file  . Transportation needs    Medical: Not on file    Non-medical: Not on file  Tobacco Use  . Smoking status: Never Smoker  . Smokeless tobacco: Never Used  Substance and Sexual Activity  . Alcohol use: Yes    Alcohol/week: 2.0 standard drinks    Types: 1 Glasses of wine, 1 Cans of beer per week    Comment: occasionally  . Drug use: No  . Sexual activity: Yes    Birth control/protection: Condom  Lifestyle  . Physical activity    Days per week: Not on file    Minutes per session: Not on file  . Stress: Not on file  Relationships  . Social Herbalist on phone: Not on file    Gets together: Not on file     Attends religious service: Not on file    Active member of club or organization: Not on file    Attends meetings of clubs or organizations: Not on file    Relationship status: Not on file  . Intimate partner violence    Fear of current or ex partner: Not on file    Emotionally abused: Not on file    Physically abused: Not on file    Forced sexual activity: Not on file  Other Topics Concern  . Not on file  Social History Narrative   Regular exercise-no   Caffeine Use-yes     Constitutional: Denies fever, malaise, fatigue, headache or abrupt weight changes.  HEENT: Denies eye pain, eye redness, ear pain, ringing in the ears, wax buildup, runny nose, nasal congestion, bloody nose, or sore throat. Respiratory: Denies difficulty breathing, shortness of breath, cough or sputum production.   Cardiovascular: Denies chest pain, chest tightness, palpitations or swelling in the hands or feet.  Gastrointestinal: Denies abdominal pain, bloating, constipation, diarrhea or blood in the stool.  GU: Denies urgency, frequency, pain with urination, burning sensation, blood in urine, odor or discharge. Musculoskeletal: Pt reports clicking to bilaterkal knees and low back pain. Denies difficulty with gait, joint swelling or weakness. Skin: Denies redness, rashes, lesions or ulcercations.  Neurological: Denies dizziness, difficulty with memory, difficulty with speech or problems with balance and coordination.  Psych: Pt has a history of anxiety. Denies depression, SI/HI.  No other specific complaints in a complete review of systems (except as listed in HPI above).     Objective:   Physical Exam  BP 118/78   Pulse 100   Temp 98 F (36.7 C) (Temporal)   Ht 5\' 11"  (1.803 m)   Wt 293 lb 8 oz (133.1 kg)   SpO2 98%   BMI 40.93 kg/m   Wt Readings from Last 3 Encounters:  06/18/18 131.5 kg  06/11/18 131.1 kg  06/04/18 131.1 kg    General: Appears his stated age, obese, in NAD. Skin: Warm, dry  and intact. No rashes, lesions or ulcerations noted. HEENT: Head: normal shape and size; Eyes: sclera white, no icterus, conjunctiva pink and EOMs intact; Neck:  Neck supple, trachea midline. No masses, lumps present.  Cardiovascular: Normal rate and rhythm. S1,S2 noted.  No murmur, rubs or gallops noted. No JVD or BLE edema.  Pulmonary/Chest: Normal effort and positive vesicular breath sounds. No respiratory distress. No wheezes, rales or ronchi noted.  Abdomen: Soft and nontender. Normal bowel sounds. No distention or masses noted.  Liver, spleen and kidneys non palpable. Musculoskeletal: Normal flexion, extension and rotation of the spine. Bony tenderness noted over the lumbar spine. Crepitus noted with ROM of bilateral kness. No signs of joint swelling. Strength 5/5 BUE/BLE. No difficulty with gait.  Neurological: Alert and oriented. Cranial nerves II-XII grossly intact. Coordination normal.  Psychiatric: Mood and affect normal. Behavior is normal. Judgment and thought content normal.     BMET    Component Value Date/Time   NA 139 06/18/2018 1030   K 4.0 06/18/2018 1030   CL 99 06/18/2018 1030   CO2 35 (H) 06/18/2018 1030   GLUCOSE 155 (H) 06/18/2018 1030   BUN 10 06/18/2018 1030   CREATININE 1.18 06/18/2018 1030   CREATININE 1.22 08/04/2016 1525   CALCIUM 9.3 06/18/2018 1030   GFRNONAA >60 06/11/2018 1018   GFRAA >60 06/11/2018 1018    Lipid Panel     Component Value Date/Time   CHOL 223 (H) 06/04/2018 1543   TRIG 236.0 (H) 06/04/2018 1543   HDL 41.60 06/04/2018 1543   CHOLHDL 5 06/04/2018 1543   VLDL 47.2 (H) 06/04/2018 1543   LDLCALC 108 (H) 02/09/2016 0913    CBC    Component Value Date/Time   WBC 8.1 06/18/2018 1030   RBC 4.77 06/18/2018 1030   HGB 13.3 06/18/2018 1030   HGB 14.8 12/07/2017 0847   HCT 38.4 (L) 06/18/2018 1030   HCT 43.4 12/07/2017 0847   PLT 313.0 06/18/2018 1030   MCV 80.5 06/18/2018 1030   MCH 26.9 06/11/2018 1018   MCHC 34.6 06/18/2018  1030   RDW 14.2 06/18/2018 1030   LYMPHSABS 2.2 06/11/2018 1018   MONOABS 0.8 06/11/2018 1018   EOSABS 0.1 06/11/2018 1018   BASOSABS 0.0 06/11/2018 1018    Hgb A1C Lab Results  Component Value Date   HGBA1C 6.4 06/04/2018            Assessment & Plan:  Preventative Health Maintenance:  Pt declines flu shot today Tetanus UTD Encouraged him to consume a balanced diet and exercise regimen Advised him to see an eye doctor and dentist annually Will check CBC, CMET, Lipid, TSH, Testosterone, Free T4 and A1C today  RTC in 1 year, sooner if needed Webb Silversmith, NP This visit occurred during the SARS-CoV-2 public health emergency.  Safety protocols were in place, including screening questions prior to the visit, additional usage of staff PPE, and extensive cleaning of exam room while observing appropriate contact time as indicated for disinfecting solutions.

## 2019-11-04 NOTE — Assessment & Plan Note (Addendum)
States he stopped taking Androgel No new concerns or issues Continue follow up with Urology

## 2019-11-05 ENCOUNTER — Encounter: Payer: Self-pay | Admitting: Internal Medicine

## 2019-11-05 DIAGNOSIS — E7800 Pure hypercholesterolemia, unspecified: Secondary | ICD-10-CM

## 2019-11-05 MED ORDER — ATORVASTATIN CALCIUM 10 MG PO TABS: 10.0000 mg | ORAL_TABLET | Freq: Every day | ORAL | 2 refills | Status: DC

## 2019-11-25 ENCOUNTER — Other Ambulatory Visit: Payer: Self-pay | Admitting: Internal Medicine

## 2019-11-25 DIAGNOSIS — Z0000 Encounter for general adult medical examination without abnormal findings: Secondary | ICD-10-CM

## 2019-11-26 MED ORDER — LISINOPRIL-HYDROCHLOROTHIAZIDE 10-12.5 MG PO TABS: 1.0000 | ORAL_TABLET | Freq: Every day | ORAL | 10 refills | Status: DC

## 2020-01-03 ENCOUNTER — Other Ambulatory Visit: Payer: Self-pay | Admitting: Internal Medicine

## 2020-01-05 NOTE — Telephone Encounter (Signed)
Revatio last filled 10/05/2019---looks like insurance will only pay for #15 at a time per note from pharmacy...had CPE 10/2019.Marland KitchenMarland KitchenMarland Kitchen please advise

## 2020-03-26 ENCOUNTER — Other Ambulatory Visit (INDEPENDENT_AMBULATORY_CARE_PROVIDER_SITE_OTHER): Payer: 59

## 2020-03-26 ENCOUNTER — Other Ambulatory Visit: Payer: Self-pay

## 2020-03-26 DIAGNOSIS — E7800 Pure hypercholesterolemia, unspecified: Secondary | ICD-10-CM

## 2020-03-26 LAB — COMPREHENSIVE METABOLIC PANEL
ALT: 28 U/L (ref 0–53)
AST: 17 U/L (ref 0–37)
Albumin: 4.4 g/dL (ref 3.5–5.2)
Alkaline Phosphatase: 85 U/L (ref 39–117)
BUN: 15 mg/dL (ref 6–23)
CO2: 32 mEq/L (ref 19–32)
Calcium: 9.6 mg/dL (ref 8.4–10.5)
Chloride: 99 mEq/L (ref 96–112)
Creatinine, Ser: 1.13 mg/dL (ref 0.40–1.50)
GFR: 68.78 mL/min (ref >60.00)
Glucose, Bld: 106 mg/dL — ABNORMAL HIGH (ref 70–99)
Potassium: 4.8 mEq/L (ref 3.5–5.1)
Sodium: 138 mEq/L (ref 135–145)
Total Bilirubin: 0.4 mg/dL (ref 0.2–1.2)
Total Protein: 7.6 g/dL (ref 6.0–8.3)

## 2020-03-26 LAB — LIPID PANEL
Cholesterol: 157 mg/dL (ref 0–200)
HDL: 49 mg/dL (ref >39.00)
LDL Cholesterol: 89 mg/dL (ref 0–99)
NonHDL: 108.4
Total CHOL/HDL Ratio: 3
Triglycerides: 99 mg/dL (ref 0.0–149.0)
VLDL: 19.8 mg/dL (ref 0.0–40.0)

## 2020-04-05 ENCOUNTER — Other Ambulatory Visit (HOSPITAL_COMMUNITY): Payer: Self-pay | Admitting: Orthopedic Surgery

## 2020-04-05 ENCOUNTER — Other Ambulatory Visit: Payer: Self-pay | Admitting: Orthopedic Surgery

## 2020-04-05 DIAGNOSIS — T84032D Mechanical loosening of internal right knee prosthetic joint, subsequent encounter: Secondary | ICD-10-CM

## 2020-04-16 ENCOUNTER — Other Ambulatory Visit: Payer: Self-pay

## 2020-04-16 ENCOUNTER — Ambulatory Visit (HOSPITAL_COMMUNITY)
Admission: RE | Admit: 2020-04-16 | Discharge: 2020-04-16 | Disposition: A | Discharge status: Home / Self Care | Payer: 59 | Source: Ambulatory Visit | Attending: Orthopedic Surgery | Admitting: Orthopedic Surgery

## 2020-04-16 DIAGNOSIS — T84032D Mechanical loosening of internal right knee prosthetic joint, subsequent encounter: Secondary | ICD-10-CM | POA: Diagnosis present

## 2020-04-16 MED ORDER — TECHNETIUM TC 99M MEDRONATE IV KIT
19.8000 | PACK | Freq: Once | INTRAVENOUS | Status: AC
  Administered 2020-04-16: 19.8 via INTRAVENOUS

## 2020-04-27 ENCOUNTER — Other Ambulatory Visit: Payer: Self-pay | Admitting: Internal Medicine

## 2020-04-28 NOTE — Telephone Encounter (Signed)
Last filled 01/05/2020 with 3 refills... please advise

## 2020-09-07 ENCOUNTER — Other Ambulatory Visit: Payer: Self-pay | Admitting: Internal Medicine

## 2020-09-08 NOTE — Telephone Encounter (Signed)
Last filled 04/28/2020 with 3 refills... please advise

## 2020-09-09 ENCOUNTER — Other Ambulatory Visit: Payer: Self-pay

## 2020-09-09 ENCOUNTER — Encounter: Payer: Self-pay | Admitting: Internal Medicine

## 2020-09-09 ENCOUNTER — Ambulatory Visit: Payer: 59 | Admitting: Cardiology

## 2020-09-09 ENCOUNTER — Encounter: Payer: Self-pay | Admitting: Cardiology

## 2020-09-09 VITALS — BP 126/89 | HR 99 | Temp 98.0°F | Ht 71.0 in | Wt 285.0 lb

## 2020-09-09 DIAGNOSIS — I10 Essential (primary) hypertension: Secondary | ICD-10-CM

## 2020-09-09 DIAGNOSIS — E6609 Other obesity due to excess calories: Secondary | ICD-10-CM

## 2020-09-09 DIAGNOSIS — R0609 Other forms of dyspnea: Secondary | ICD-10-CM

## 2020-09-09 DIAGNOSIS — Z01810 Encounter for preprocedural cardiovascular examination: Secondary | ICD-10-CM

## 2020-09-09 DIAGNOSIS — E782 Mixed hyperlipidemia: Secondary | ICD-10-CM

## 2020-09-09 DIAGNOSIS — Z8249 Family history of ischemic heart disease and other diseases of the circulatory system: Secondary | ICD-10-CM

## 2020-09-09 MED ORDER — METOPROLOL SUCCINATE ER 25 MG PO TB24: 25.0000 mg | ORAL_TABLET | Freq: Every morning | ORAL | 0 refills | Status: DC

## 2020-09-09 NOTE — Progress Notes (Signed)
Date:  09/09/2020   ID:  Kenneth Sherman, DOB 04-17-1971, MRN [id number]  PCP:  Jearld Fenton, NP  Cardiologist:  Rex Kras, DO, [facility] (established care 09/09/2020)  REASON FOR CONSULT: Preoperative risk stratification  REQUESTING PHYSICIAN:  Jearld Fenton, NP [place],  [place] [address]  Chief Complaint  Patient presents with  . Pre-op Exam    family history of early CAD  . New Patient (Initial Visit)    HPI  Kenneth Sherman is a 49 y.o. male who presents to the office with a chief complaint of " cardiovascular evaluation prior to upcoming surgery:." Patient's past medical history and cardiovascular risk factors include: Family history of premature coronary artery disease, hypertension, hyperlipidemia, obesity.   He is referred to the office at the request of Jearld Fenton, NP for evaluation of preoperative stratification prior to upcoming noncardiac surgery.  Patient is planning to undergo left knee replacement in the near future.  Date of surgery is to be determined.  Patient's orthopedic surgeon has requested preoperative stratification given family history of premature coronary artery disease.  Patient states that both his parents passed away in their 26s due to heart attacks.  He denies any active chest pain at rest or with effort related activities.  He does have unrelated dyspnea for some time now which has been contributing to underlying obesity.  His overall functional status is limited due to osteoarthritis of the knee.  But he tries to get in about 6000 steps per day.  Patient is not a diabetic.  No prior history of coronary artery disease or myocardial infarction or congestive heart failure.  Patient states that his last ischemic evaluation was 12 years ago when he had a stress test.  Family history of premature coronary disease (mom passed at the age of 67 and dad passed away at the age of 28 due to heart disease).   Denies prior history of coronary  artery disease, myocardial infarction, congestive heart failure, deep venous thrombosis, pulmonary embolism, stroke, transient ischemic attack.  FUNCTIONAL STATUS: No structured exercise program or daily routine. His is able to get in 6,000 steps per day.    ALLERGIES: No Known Allergies  MEDICATION LIST PRIOR TO VISIT: Current Meds  Medication Sig  . atorvastatin (LIPITOR) 10 MG tablet Take 1 tablet (10 mg total) by mouth daily.  . diclofenac (VOLTAREN) 50 MG EC tablet Take 50 mg by mouth 2 (two) times daily as needed.  Marland Kitchen lisinopril-hydrochlorothiazide (ZESTORETIC) 10-12.5 MG tablet Take 1 tablet by mouth daily.  . nortriptyline (PAMELOR) 50 MG capsule TAKE 1 CAPSULE (50 MG TOTAL) BY MOUTH AT BEDTIME.  Marland Kitchen omeprazole (PRILOSEC) 20 MG capsule TAKE 1 CAPSULE BY MOUTH EVERY DAY  . sildenafil (REVATIO) 20 MG tablet TAKE 2-5 TABS AS NEEDED FOR SEXUAL INTERCOURSE *MAX PER INSURANCE*     PAST MEDICAL HISTORY: Past Medical History:  Diagnosis Date  . Arthritis   . Heartburn   . HLD (hyperlipidemia)   . Hypertension    no meds  . Hypogonadism in male   . Right knee DJD   . Thyroid disease     PAST SURGICAL HISTORY: Past Surgical History:  Procedure Laterality Date  . BICEPS TENDON REPAIR  05/2015  . KNEE ARTHROSCOPY W/ MENISCECTOMY  2008   left  . KNEE ARTHROSCOPY W/ MENISCECTOMY  2011   right  . PARTIAL KNEE ARTHROPLASTY  11/29/2012   Procedure: UNICOMPARTMENTAL KNEE;  Surgeon: Lorn Junes, MD;  Location: [facility];  Service: Orthopedics;  Laterality: Right;    FAMILY HISTORY: The patient family history includes Heart attack in his father and mother; Hypertension in his brother, father, and mother.  SOCIAL HISTORY:  The patient  reports that he has never smoked. He has never used smokeless tobacco. He reports current alcohol use of about 2.0 standard drinks of alcohol per week. He reports that he does not use drugs.  REVIEW OF SYSTEMS: Review of Systems  Constitutional:  Negative for chills and fever.  HENT: Negative for hoarse voice and nosebleeds.   Eyes: Negative for discharge, double vision and pain.  Cardiovascular: Positive for dyspnea on exertion (chronic and stable). Negative for chest pain, claudication, leg swelling, near-syncope, orthopnea, palpitations, paroxysmal nocturnal dyspnea and syncope.  Respiratory: Negative for hemoptysis and shortness of breath.   Musculoskeletal: Negative for muscle cramps and myalgias.  Gastrointestinal: Negative for abdominal pain, constipation, diarrhea, hematemesis, hematochezia, melena, nausea and vomiting.  Neurological: Negative for dizziness and light-headedness.    PHYSICAL EXAM: Vitals with BMI 09/09/2020 11/04/2019 06/18/2018  Height 5\' 11"  5\' 11"  -  Weight 285 lbs 293 lbs 8 oz 290 lbs  BMI 39.77 83.41 96.22  Systolic 297 989 211  Diastolic 89 78 84  Pulse 99 100 94    CONSTITUTIONAL: Well-developed and well-nourished. No acute distress.  SKIN: Skin is warm and dry. No rash noted. No cyanosis. No pallor. No jaundice HEAD: Normocephalic and atraumatic.  EYES: No scleral icterus MOUTH/THROAT: Moist oral membranes.  NECK: No JVD present. No thyromegaly noted. No carotid bruits  LYMPHATIC: No visible cervical adenopathy.  CHEST Normal respiratory effort. No intercostal retractions  LUNGS: Clear to auscultation bilaterally. No stridor. No wheezes. No rales.  CARDIOVASCULAR: Regular rate and rhythm, positive S1-S2, no murmurs rubs or gallops appreciated. ABDOMINAL: Obese, soft, nontender, nondistended, positive bowel sounds all 4 quadrants.No apparent ascites.  EXTREMITIES: No peripheral edema  HEMATOLOGIC: No significant bruising NEUROLOGIC: Oriented to person, place, and time. Nonfocal. Normal muscle tone.  PSYCHIATRIC: Normal mood and affect. Normal behavior. Cooperative  CARDIAC DATABASE: EKG: 09/09/2020: Normal sinus rhythm, 99 bpm, poor R wave progression, without underlying ischemia or injury  pattern.  Echocardiogram: No results found for this or any previous visit from the past 1095 days.  Stress Testing: No results found for this or any previous visit from the past 1095 days.  Heart Catheterization: None  LABORATORY DATA: CBC Latest Ref Rng & Units 11/04/2019 06/18/2018 06/11/2018  WBC 4.0 - 10.5 K/uL 8.1 8.1 11.6(H)  Hemoglobin 13.0 - 17.0 g/dL 14.0 13.3 14.7  Hematocrit 39 - 52 % 41.6 38.4(L) 43.7  Platelets 150 - 400 K/uL 343.0 313.0 298    CMP Latest Ref Rng & Units 03/26/2020 11/04/2019 06/18/2018  Glucose 70 - 99 mg/dL 106(H) 118(H) 155(H)  BUN 6 - 23 mg/dL 15 12 10   Creatinine 0.40 - 1.50 mg/dL 1.13 1.01 1.18  Sodium 135 - 145 mEq/L 138 138 139  Potassium 3.5 - 5.1 mEq/L 4.8 4.2 4.0  Chloride 96 - 112 mEq/L 99 101 99  CO2 19 - 32 mEq/L 32 31 35(H)  Calcium 8.4 - 10.5 mg/dL 9.6 9.4 9.3  Total Protein 6.0 - 8.3 g/dL 7.6 7.6 7.3  Total Bilirubin 0.2 - 1.2 mg/dL 0.4 0.4 0.3  Alkaline Phos 39 - 117 U/L 85 77 74  AST 0 - 37 U/L 17 22 17   ALT 0 - 53 U/L 28 29 24     Lipid Panel     Component  Value Date/Time   CHOL 157 03/26/2020 0849   TRIG 99.0 03/26/2020 0849   HDL 49.00 03/26/2020 0849   CHOLHDL 3 03/26/2020 0849   VLDL 19.8 03/26/2020 0849   LDLCALC 89 03/26/2020 0849   LDLDIRECT 157.0 06/04/2018 1543    No components found for: NTPROBNP No results for input(s): PROBNP in the last 8760 hours. Recent Labs    11/04/19 1111  TSH 3.71    BMP Recent Labs    11/04/19 1111 03/26/20 0849  NA 138 138  K 4.2 4.8  CL 101 99  CO2 31 32  GLUCOSE 118* 106*  BUN 12 15  CREATININE 1.01 1.13  CALCIUM 9.4 9.6    HEMOGLOBIN A1C Lab Results  Component Value Date   HGBA1C 6.3 11/04/2019    IMPRESSION:    ICD-10-CM   1. Preop cardiovascular exam  Z01.810 metoprolol succinate (TOPROL XL) 25 MG 24 hr tablet  2. Dyspnea on exertion  R06.00 PCV ECHOCARDIOGRAM COMPLETE    PCV MYOCARDIAL PERFUSION WITH LEXISCAN    CT CARDIAC SCORING    metoprolol  succinate (TOPROL XL) 25 MG 24 hr tablet  3. Family history of early CAD  Z82.49 EKG 12-Lead  4. Benign hypertension  I10 metoprolol succinate (TOPROL XL) 25 MG 24 hr tablet  5. Mixed hyperlipidemia  E78.2   6. Class 2 obesity due to excess calories without serious comorbidity with body mass index (BMI) of 39.0 to 39.9 in adult  E66.09    Z68.39      RECOMMENDATIONS: Kenneth Sherman is a 49 y.o. male whose past medical history and cardiac risk factors include: Family history of premature coronary artery disease, hypertension, hyperlipidemia, obesity.   Preoperative risk stratification:  Patient has multiple cardiovascular risk factors as noted above and experiences effort related dyspnea.    His overall functional status is unknown as it is due to knee osteoarthritis.    Echocardiogram will be ordered to evaluate for structural heart disease and left ventricular systolic function.   Nuclear stress test recommended to evaluate for reversible ischemia.   Patient's orthopedic surgeon: Dr. Elsie Saas, type of anesthesia unknown, date of surgery to be determined.  Further recommendations to follow.  Family history of premature coronary artery disease:  We will schedule a coronary artery calcification score for further stratification.  Patient is currently on statin therapy.  Benign essential hypertension: Office blood pressure is very well controlled.  Patient's heart rate usually ranges between 90--100 bpm.  Because he is going to undergo noncardiac surgery more than 2 weeks out recommended initiation of Toprol-XL 25 mg p.o. daily.  Currently managed by primary care provider.  Mixed hyperlipidemia: Continue statin therapy.  Currently managed by primary care provider.  Obesity, due to excess calories: Body mass index is 39.75 kg/m. . I reviewed with the patient the importance of diet, regular physical activity/exercise, weight loss.   . Patient is educated on increasing physical  activity gradually as tolerated.  With the goal of moderate intensity exercise for 30 minutes a day 5 days a week.  FINAL MEDICATION LIST END OF ENCOUNTER: Meds ordered this encounter  Medications  . metoprolol succinate (TOPROL XL) 25 MG 24 hr tablet    Sig: Take 1 tablet (25 mg total) by mouth in the morning.    Dispense:  90 tablet    Refill:  0    There are no discontinued medications.   Current Outpatient Medications:  .  atorvastatin (LIPITOR) 10 MG tablet, Take 1 tablet (  10 mg total) by mouth daily., Disp: 30 tablet, Rfl: 2 .  diclofenac (VOLTAREN) 50 MG EC tablet, Take 50 mg by mouth 2 (two) times daily as needed., Disp: , Rfl:  .  lisinopril-hydrochlorothiazide (ZESTORETIC) 10-12.5 MG tablet, Take 1 tablet by mouth daily., Disp: 30 tablet, Rfl: 10 .  nortriptyline (PAMELOR) 50 MG capsule, TAKE 1 CAPSULE (50 MG TOTAL) BY MOUTH AT BEDTIME., Disp: 90 capsule, Rfl: 2 .  omeprazole (PRILOSEC) 20 MG capsule, TAKE 1 CAPSULE BY MOUTH EVERY DAY, Disp: 30 capsule, Rfl: 10 .  sildenafil (REVATIO) 20 MG tablet, TAKE 2-5 TABS AS NEEDED FOR SEXUAL INTERCOURSE *MAX PER INSURANCE*, Disp: 15 tablet, Rfl: 3 .  metoprolol succinate (TOPROL XL) 25 MG 24 hr tablet, Take 1 tablet (25 mg total) by mouth in the morning., Disp: 90 tablet, Rfl: 0  Orders Placed This Encounter  Procedures  . CT CARDIAC SCORING  . PCV MYOCARDIAL PERFUSION WITH LEXISCAN  . EKG 12-Lead  . PCV ECHOCARDIOGRAM COMPLETE    There are no Patient Instructions on file for this visit.   --Continue cardiac medications as reconciled in final medication list. --Return in about 4 weeks (around 10/07/2020) for Reevaluation of symptoms, reveiw results. . Or sooner if needed. --Continue follow-up with your primary care physician regarding the management of your other chronic comorbid conditions.  Patient's questions and concerns were addressed to his satisfaction. He voices understanding of the instructions provided during this  encounter.   This note was created using a voice recognition software as a result there may be grammatical errors inadvertently enclosed that do not reflect the nature of this encounter. Every attempt is made to correct such errors.  Rex Kras, [place], [facility]  Pager: [phone] Office: [phone]

## 2020-09-14 ENCOUNTER — Other Ambulatory Visit: Payer: Self-pay

## 2020-09-14 DIAGNOSIS — R0609 Other forms of dyspnea: Secondary | ICD-10-CM

## 2020-09-15 ENCOUNTER — Other Ambulatory Visit: Payer: Self-pay

## 2020-09-15 ENCOUNTER — Ambulatory Visit: Payer: 59

## 2020-09-16 ENCOUNTER — Ambulatory Visit: Payer: 59

## 2020-09-22 NOTE — Progress Notes (Signed)
Spoke to [place] E. she will take care of scheduling patient.

## 2020-09-27 ENCOUNTER — Ambulatory Visit: Payer: 59 | Admitting: Cardiology

## 2020-09-27 ENCOUNTER — Encounter: Payer: Self-pay | Admitting: Cardiology

## 2020-09-27 ENCOUNTER — Other Ambulatory Visit: Payer: Self-pay

## 2020-09-27 ENCOUNTER — Ambulatory Visit (INDEPENDENT_AMBULATORY_CARE_PROVIDER_SITE_OTHER): Payer: 59 | Admitting: Internal Medicine

## 2020-09-27 ENCOUNTER — Encounter: Payer: Self-pay | Admitting: Internal Medicine

## 2020-09-27 VITALS — BP 140/90 | HR 96 | Resp 16 | Ht 71.0 in | Wt 286.0 lb

## 2020-09-27 VITALS — BP 132/84 | HR 82 | Temp 98.6°F | Wt 284.0 lb

## 2020-09-27 DIAGNOSIS — I2584 Coronary atherosclerosis due to calcified coronary lesion: Secondary | ICD-10-CM

## 2020-09-27 DIAGNOSIS — Z01818 Encounter for other preprocedural examination: Secondary | ICD-10-CM | POA: Diagnosis not present

## 2020-09-27 DIAGNOSIS — Z8249 Family history of ischemic heart disease and other diseases of the circulatory system: Secondary | ICD-10-CM

## 2020-09-27 DIAGNOSIS — I10 Essential (primary) hypertension: Secondary | ICD-10-CM

## 2020-09-27 DIAGNOSIS — E782 Mixed hyperlipidemia: Secondary | ICD-10-CM

## 2020-09-27 DIAGNOSIS — E6609 Other obesity due to excess calories: Secondary | ICD-10-CM

## 2020-09-27 DIAGNOSIS — M1712 Unilateral primary osteoarthritis, left knee: Secondary | ICD-10-CM

## 2020-09-27 DIAGNOSIS — Z01810 Encounter for preprocedural cardiovascular examination: Secondary | ICD-10-CM

## 2020-09-27 DIAGNOSIS — E66812 Obesity, class 2: Secondary | ICD-10-CM

## 2020-09-27 DIAGNOSIS — R0609 Other forms of dyspnea: Secondary | ICD-10-CM

## 2020-09-27 DIAGNOSIS — I2510 Atherosclerotic heart disease of native coronary artery without angina pectoris: Secondary | ICD-10-CM

## 2020-09-27 LAB — POCT GLYCOSYLATED HEMOGLOBIN (HGB A1C): Hemoglobin A1C: 6.2 % — AB (ref 4.0–5.6)

## 2020-09-27 MED ORDER — ASPIRIN EC 81 MG PO TBEC: 81.0000 mg | DELAYED_RELEASE_TABLET | Freq: Every day | ORAL | 3 refills | Status: DC

## 2020-09-27 NOTE — Patient Instructions (Signed)

## 2020-09-27 NOTE — Progress Notes (Signed)
Subjective:    Patient ID: Kenneth Sherman, male    DOB: 05-15-1971, 49 y.o.   MRN: [id number]  HPI  Pt presents to the clinic today for preoperative clearance. He will be having a left total knee replacement, performed by Dr Noemi Chapel. The exact surgery date has not been scheduled. Xray from 10/2019 showed:  IMPRESSION: 1. No acute fracture or dislocation. 2. Partial arthroplasty of the right knee involving the medial compartment similar to prior radiograph. 3. Small bilateral suprapatellar effusion. 4. Mild arthritic changes of the left knee with narrowing of the medial compartment. 5. Thickening of the patellar tendon of the left knee with bone fragmentation at the insertion of the patellar tendon suggestive of chronic tendinopathy. Clinical correlation is recommended.  NM Bone Scan from 04/2020 showed:  IMPRESSION: 1. Fairly intense uptake in the medial compartment of the LEFT knee consistent with osteoarthritis. 2. No evidence loosening or infection of the RIGHT knee hemiarthroplasty.  He takes Diclofenac for pain. He had a left knee arthroscopy with meniscectomy in 2008.   He had his preop ECG with cardiology 09/09/20, no acute findings.  Review of Systems  Past Medical History:  Diagnosis Date  . Arthritis   . Heartburn   . HLD (hyperlipidemia)   . Hypertension    no meds  . Hypogonadism in male   . Right knee DJD   . Thyroid disease     Current Outpatient Medications  Medication Sig Dispense Refill  . atorvastatin (LIPITOR) 10 MG tablet Take 1 tablet (10 mg total) by mouth daily. 30 tablet 2  . diclofenac (VOLTAREN) 50 MG EC tablet Take 50 mg by mouth 2 (two) times daily as needed.    Marland Kitchen lisinopril-hydrochlorothiazide (ZESTORETIC) 10-12.5 MG tablet Take 1 tablet by mouth daily. 30 tablet 10  . metoprolol succinate (TOPROL XL) 25 MG 24 hr tablet Take 1 tablet (25 mg total) by mouth in the morning. 90 tablet 0  . nortriptyline (PAMELOR) 50 MG capsule TAKE 1 CAPSULE  (50 MG TOTAL) BY MOUTH AT BEDTIME. 90 capsule 2  . omeprazole (PRILOSEC) 20 MG capsule TAKE 1 CAPSULE BY MOUTH EVERY DAY 30 capsule 10  . sildenafil (REVATIO) 20 MG tablet TAKE 2-5 TABS AS NEEDED FOR SEXUAL INTERCOURSE *MAX PER INSURANCE* 15 tablet 3   No current facility-administered medications for this visit.    No Known Allergies  Family History  Problem Relation Age of Onset  . Heart attack Mother   . Hypertension Mother   . Hypertension Father   . Heart attack Father   . Hypertension Brother   . Kidney disease Neg Hx   . Prostate cancer Neg Hx   . Bladder Cancer Neg Hx     Social History   Socioeconomic History  . Marital status: Married    Spouse name: Not on file  . Number of children: Not on file  . Years of education: 77  . Highest education level: Not on file  Occupational History  . Occupation: CAD Games developer: [employer]  Tobacco Use  . Smoking status: Never Smoker  . Smokeless tobacco: Never Used  Vaping Use  . Vaping Use: Never used  Substance and Sexual Activity  . Alcohol use: Yes    Alcohol/week: 2.0 standard drinks    Types: 1 Glasses of wine, 1 Cans of beer per week    Comment: occasionally  . Drug use: No  . Sexual activity: Yes    Birth control/protection: Condom  Other Topics Concern  . Not on file  Social History Narrative   Regular exercise-no   Caffeine Use-yes   Social Determinants of Health   Financial Resource Strain:   . Difficulty of Paying Living Expenses: Not on file  Food Insecurity:   . Worried About Charity fundraiser in the Last Year: Not on file  . Ran Out of Food in the Last Year: Not on file  Transportation Needs:   . Lack of Transportation (Medical): Not on file  . Lack of Transportation (Non-Medical): Not on file  Physical Activity:   . Days of Exercise per Week: Not on file  . Minutes of Exercise per Session: Not on file  Stress:   . Feeling of Stress : Not on file  Social Connections:   .  Frequency of Communication with Friends and Family: Not on file  . Frequency of Social Gatherings with Friends and Family: Not on file  . Attends Religious Services: Not on file  . Active Member of Clubs or Organizations: Not on file  . Attends Archivist Meetings: Not on file  . Marital Status: Not on file  Intimate Partner Violence:   . Fear of Current or Ex-Partner: Not on file  . Emotionally Abused: Not on file  . Physically Abused: Not on file  . Sexually Abused: Not on file     Constitutional: Denies fever, malaise, fatigue, headache or abrupt weight changes.  Respiratory: Denies difficulty breathing, shortness of breath, cough or sputum production.   Cardiovascular: Denies chest pain, chest tightness, palpitations or swelling in the hands or feet.  Musculoskeletal: Pt reports left knee pain. Denies difficulty with gait, muscle pain or joint swelling.  Skin: Denies redness, rashes, lesions or ulcercations.    No other specific complaints in a complete review of systems (except as listed in HPI above).     Objective:   Physical Exam BP 132/84   Pulse 82   Temp 98.6 F (37 C) (Temporal)   Wt 284 lb (128.8 kg)   SpO2 98%   BMI 39.61 kg/m   There were no vitals taken for this visit. Wt Readings from Last 3 Encounters:  09/27/20 286 lb (129.7 kg)  09/09/20 285 lb (129.3 kg)  11/04/19 293 lb 8 oz (133.1 kg)    General: Appears his stated age, obese, in NAD. Skin: Warm, dry and intact. No rashes noted.  Cardiovascular: Normal rate and rhythm.  Pulmonary/Chest: Normal effort and positive vesicular breath sounds.  Musculoskeletal: Normal flexion and extension of the left knee. Crepitus noted with range of motion. Joint enlargement noted. No signs of joint swelling. No difficulty with gait.  Neurological: Alert and oriented. Sensation intact to LLE.   BMET    Component Value Date/Time   NA 138 03/26/2020 0849   K 4.8 03/26/2020 0849   CL 99 03/26/2020 0849     CO2 32 03/26/2020 0849   GLUCOSE 106 (H) 03/26/2020 0849   BUN 15 03/26/2020 0849   CREATININE 1.13 03/26/2020 0849   CREATININE 1.22 08/04/2016 1525   CALCIUM 9.6 03/26/2020 0849   GFRNONAA >60 06/11/2018 1018   GFRAA >60 06/11/2018 1018    Lipid Panel     Component Value Date/Time   CHOL 157 03/26/2020 0849   TRIG 99.0 03/26/2020 0849   HDL 49.00 03/26/2020 0849   CHOLHDL 3 03/26/2020 0849   VLDL 19.8 03/26/2020 0849   LDLCALC 89 03/26/2020 0849    CBC    Component Value  Date/Time   WBC 8.1 11/04/2019 1111   RBC 5.06 11/04/2019 1111   HGB 14.0 11/04/2019 1111   HGB 14.8 12/07/2017 0847   HCT 41.6 11/04/2019 1111   HCT 43.4 12/07/2017 0847   PLT 343.0 11/04/2019 1111   MCV 82.3 11/04/2019 1111   MCH 26.9 06/11/2018 1018   MCHC 33.7 11/04/2019 1111   RDW 14.4 11/04/2019 1111   LYMPHSABS 2.2 06/11/2018 1018   MONOABS 0.8 06/11/2018 1018   EOSABS 0.1 06/11/2018 1018   BASOSABS 0.0 06/11/2018 1018    Hgb A1C Lab Results  Component Value Date   HGBA1C 6.3 11/04/2019            Assessment & Plan:   Preoperative Screening:  ECG from cardiology reviewed Labs from cardiology reviewed POCT A1C today 6.2% Form filled out, faxed to [facility]  Return precautions discussed  Webb Silversmith, NP This visit occurred during the SARS-CoV-2 public health emergency.  Safety protocols were in place, including screening questions prior to the visit, additional usage of staff PPE, and extensive cleaning of exam room while observing appropriate contact time as indicated for disinfecting solutions.  '

## 2020-09-27 NOTE — Progress Notes (Signed)
Date:  09/27/2020   ID:  [name], DOB July 29, 1971, MRN [id number]  PCP:  Jearld Fenton, NP  Cardiologist:  Rex Kras, DO, [facility] (established care 09/09/2020)  Date: 09/27/20 Last Office Visit: 09/09/2020  Chief Complaint  Patient presents with   Results   Follow-up    HPI  Kenneth Sherman is a 49 y.o. male who presents to the office with a chief complaint of " preoperative stratification and review test results." Patient's past medical history and cardiovascular risk factors include: Family history of premature coronary artery disease, coronary artery calcification, hypertension, hyperlipidemia, obesity.   He is referred to the office at the request of Jearld Fenton, NP for evaluation of preoperative stratification prior to upcoming noncardiac surgery.  Patient is planning to undergo left knee replacement in the near future.  Date of surgery is to be determined.  Patient's orthopedic surgeon has requested preoperative stratification given family history of premature coronary artery disease.  Patient states that both his parents passed away in their 34s due to heart attacks.    Since he had related dyspnea he was recommended to undergo echocardiogram, stress test, and coronary artery calcification score.  Patient is noted to have mild coronary artery calcification as outlined below.  Echocardiogram notes preserved LVEF.  And nuclear stress test overall low risk study.  Clinically patient states that his effort related dyspnea improved significantly since last office visit.  His blood pressures at home are very well controlled with systolic blood pressures less than 245 mmHg and diastolic blood pressure between 80-89 mmHg.    Patient is not a diabetic.  No prior history of coronary artery disease or myocardial infarction or congestive heart failure.    Family history of premature coronary disease (mom passed at the age of 9 and dad passed away at the age of 16 due to heart disease).    Denies prior history of myocardial infarction, congestive heart failure, deep venous thrombosis, pulmonary embolism, stroke, transient ischemic attack.  FUNCTIONAL STATUS: No structured exercise program or daily routine. His is able to get in 6,000 steps per day.    ALLERGIES: No Known Allergies  MEDICATION LIST PRIOR TO VISIT: Current Meds  Medication Sig   atorvastatin (LIPITOR) 10 MG tablet Take 1 tablet (10 mg total) by mouth daily.   diclofenac (VOLTAREN) 50 MG EC tablet Take 50 mg by mouth 2 (two) times daily as needed.   lisinopril-hydrochlorothiazide (ZESTORETIC) 10-12.5 MG tablet Take 1 tablet by mouth daily.   metoprolol succinate (TOPROL XL) 25 MG 24 hr tablet Take 1 tablet (25 mg total) by mouth in the morning.   nortriptyline (PAMELOR) 50 MG capsule TAKE 1 CAPSULE (50 MG TOTAL) BY MOUTH AT BEDTIME.   omeprazole (PRILOSEC) 20 MG capsule TAKE 1 CAPSULE BY MOUTH EVERY DAY   sildenafil (REVATIO) 20 MG tablet TAKE 2-5 TABS AS NEEDED FOR SEXUAL INTERCOURSE *MAX PER INSURANCE*     PAST MEDICAL HISTORY: Past Medical History:  Diagnosis Date   Arthritis    Coronary atherosclerosis due to calcified coronary lesion    Heartburn    HLD (hyperlipidemia)    Hypertension    no meds   Hypogonadism in male    Right knee DJD    Thyroid disease     PAST SURGICAL HISTORY: Past Surgical History:  Procedure Laterality Date   BICEPS TENDON REPAIR  05/2015   KNEE ARTHROSCOPY W/ MENISCECTOMY  2008   left   KNEE ARTHROSCOPY W/ MENISCECTOMY  2011  right   PARTIAL KNEE ARTHROPLASTY  11/29/2012   Procedure: UNICOMPARTMENTAL KNEE;  Surgeon: Lorn Junes, MD;  Location: [place];  Service: Orthopedics;  Laterality: Right;    FAMILY HISTORY: The patient family history includes Heart attack in his father and mother; Hypertension in his brother, father, and mother.  SOCIAL HISTORY:  The patient  reports that he has never smoked. He has never used smokeless  tobacco. He reports current alcohol use of about 2.0 standard drinks of alcohol per week. He reports that he does not use drugs.  REVIEW OF SYSTEMS: Review of Systems  Constitutional: Negative for chills and fever.  HENT: Negative for hoarse voice and nosebleeds.   Eyes: Negative for discharge, double vision and pain.  Cardiovascular: Positive for dyspnea on exertion (improving). Negative for chest pain, claudication, leg swelling, near-syncope, orthopnea, palpitations, paroxysmal nocturnal dyspnea and syncope.  Respiratory: Negative for hemoptysis and shortness of breath.   Musculoskeletal: Negative for muscle cramps and myalgias.  Gastrointestinal: Negative for abdominal pain, constipation, diarrhea, hematemesis, hematochezia, melena, nausea and vomiting.  Neurological: Negative for dizziness and light-headedness.    PHYSICAL EXAM: Vitals with BMI 09/27/2020 09/27/2020 09/09/2020  Height - 5\' 11"  5\' 11"   Weight 284 lbs 286 lbs 285 lbs  BMI 39.63 83.15 17.61  Systolic 607 371 062  Diastolic 84 90 89  Pulse 82 96 99    CONSTITUTIONAL: Well-developed and well-nourished. No acute distress.  SKIN: Skin is warm and dry. No rash noted. No cyanosis. No pallor. No jaundice HEAD: Normocephalic and atraumatic.  EYES: No scleral icterus MOUTH/THROAT: Moist oral membranes.  NECK: No JVD present. No thyromegaly noted. No carotid bruits  LYMPHATIC: No visible cervical adenopathy.  CHEST Normal respiratory effort. No intercostal retractions  LUNGS: Clear to auscultation bilaterally. No stridor. No wheezes. No rales.  CARDIOVASCULAR: Regular rate and rhythm, positive S1-S2, no murmurs rubs or gallops appreciated. ABDOMINAL: Obese, soft, nontender, nondistended, positive bowel sounds all 4 quadrants.No apparent ascites.  EXTREMITIES: No peripheral edema  HEMATOLOGIC: No significant bruising NEUROLOGIC: Oriented to person, place, and time. Nonfocal. Normal muscle tone.  PSYCHIATRIC: Normal mood  and affect. Normal behavior. Cooperative  CARDIAC DATABASE: EKG: 09/09/2020: Normal sinus rhythm, 99 bpm, poor R wave progression, without underlying ischemia or injury pattern.  Echocardiogram: 09/16/2020: Left ventricle cavity is normal in size and wall thickness. Normal global wall motion. Normal LV systolic function with visual EF 50-55%. Normal diastolic filling pattern. Left atrial cavity is mildly dilated. Mild tricuspid regurgitation. No evidence of pulmonary hypertension.  Stress Testing: Lexiscan (Walking with Jaci Carrel) Sestamibi Stress Test 09/15/2020: Nondiagnostic ECG stress due to Lexiscan stress.  Diaphragmatic attenuation and apical thinning noted without ischemia.  Gated SPECT imaging of the left ventricle was normal. All segments of left ventricle demonstrated normal wall motion and thickening. No stress lung uptake. TID is normal. Stress LV EF is normal 57%.  No previous exam available for comparison. Low risk.   Coronary artery calcification scoring performed on 09/24/2020 at [facility]: Left main: 0 Left anterior descending: 11 Left circumflex: 1 Right coronary artery: 8 Total calcium score: 20 AU Noncardiac findings: Small sliding hiatal hernia.  Heart Catheterization: None  LABORATORY DATA: CBC Latest Ref Rng & Units 11/04/2019 06/18/2018 06/11/2018  WBC 4.0 - 10.5 K/uL 8.1 8.1 11.6(H)  Hemoglobin 13.0 - 17.0 g/dL 14.0 13.3 14.7  Hematocrit 39 - 52 % 41.6 38.4(L) 43.7  Platelets 150 - 400 K/uL 343.0 313.0 298    CMP Latest Ref Rng &  Units 03/26/2020 11/04/2019 06/18/2018  Glucose 70 - 99 mg/dL 106(H) 118(H) 155(H)  BUN 6 - 23 mg/dL 15 12 10   Creatinine 0.40 - 1.50 mg/dL 1.13 1.01 1.18  Sodium 135 - 145 mEq/L 138 138 139  Potassium 3.5 - 5.1 mEq/L 4.8 4.2 4.0  Chloride 96 - 112 mEq/L 99 101 99  CO2 19 - 32 mEq/L 32 31 35(H)  Calcium 8.4 - 10.5 mg/dL 9.6 9.4 9.3  Total Protein 6.0 - 8.3 g/dL 7.6 7.6 7.3  Total Bilirubin 0.2 - 1.2 mg/dL 0.4 0.4 0.3    Alkaline Phos 39 - 117 U/L 85 77 74  AST 0 - 37 U/L 17 22 17   ALT 0 - 53 U/L 28 29 24     Lipid Panel     Component Value Date/Time   CHOL 157 03/26/2020 0849   TRIG 99.0 03/26/2020 0849   HDL 49.00 03/26/2020 0849   CHOLHDL 3 03/26/2020 0849   VLDL 19.8 03/26/2020 0849   LDLCALC 89 03/26/2020 0849   LDLDIRECT 157.0 06/04/2018 1543    No components found for: NTPROBNP No results for input(s): PROBNP in the last 8760 hours. Recent Labs    11/04/19 1111  TSH 3.71    BMP Recent Labs    11/04/19 1111 03/26/20 0849  NA 138 138  K 4.2 4.8  CL 101 99  CO2 31 32  GLUCOSE 118* 106*  BUN 12 15  CREATININE 1.01 1.13  CALCIUM 9.4 9.6    HEMOGLOBIN A1C Lab Results  Component Value Date   HGBA1C 6.3 11/04/2019    IMPRESSION:    ICD-10-CM   1. Preop cardiovascular exam  Z01.810   2. Coronary atherosclerosis due to calcified coronary lesion of native artery  I25.10 aspirin EC 81 MG tablet   I25.84   3. Dyspnea on exertion  R06.00   4. Family history of early CAD  Z54.49   37. Benign hypertension  I10   6. Mixed hyperlipidemia  E78.2   7. Class 2 obesity due to excess calories without serious comorbidity with body mass index (BMI) of 39.0 to 39.9 in adult  E66.09    Z68.39      RECOMMENDATIONS: Carolyn Maniscalco is a 49 y.o. male whose past medical history and cardiac risk factors include: Family history of premature coronary artery disease, hypertension, hyperlipidemia, obesity.   Preoperative risk stratification:  Patient plans undergo left knee replacement.  Patient's orthopedic surgeon: Dr. Elsie Saas, type of anesthesia unknown, date of surgery to be determined.  Echocardiogram notes preserved LVEF without significant valvular heart disease.  And nuclear stress test overall a low risk study.  Patient currently does not have any active chest pain or anginal equivalent, no significant valvular heart disease, no diabetes mellitus, serum creatinine less than 2  mg/dL.  Patient is optimized from a cardiovascular standpoint.  However due to his nonmodifiable cardiovascular risk factors he was to be considered a low risk candidate for upcoming noncardiac surgery. This preoperative risk assessment is a tool to assist the surgeon in estimating the cardiac risk for the proposed upcoming noncardiac surgery.  The shared decision to proceed with surgery will be ultimately at the discretion of the patient after the surgical risks, benefits, and alternatives have been discussed amongst the patient and his surgical team.  Patient finds his preoperative risk assessment favorable.  Mild coronary artery calcification: Continue statin therapy.  Initiate aspirin 81 mg p.o. daily.  Continue improving modifiable cardiovascular risk factors.  Family history of premature  coronary artery disease: See above  Benign essential hypertension: Patient states that his home blood pressures are better controlled compared to office readings.  Medications reconciled.  Currently managed by primary care provider.  Mixed hyperlipidemia: Continue statin therapy.  Currently managed by primary care provider.  Obesity, due to excess calories: Body mass index is 39.89 kg/m.  I reviewed with the patient the importance of diet, regular physical activity/exercise, weight loss.    Patient is educated on increasing physical activity gradually as tolerated.  With the goal of moderate intensity exercise for 30 minutes a day 5 days a week.  FINAL MEDICATION LIST END OF ENCOUNTER: Meds ordered this encounter  Medications   aspirin EC 81 MG tablet    Sig: Take 1 tablet (81 mg total) by mouth daily. Swallow whole.    Dispense:  90 tablet    Refill:  3    There are no discontinued medications.   Current Outpatient Medications:    atorvastatin (LIPITOR) 10 MG tablet, Take 1 tablet (10 mg total) by mouth daily., Disp: 30 tablet, Rfl: 2   diclofenac (VOLTAREN) 50 MG EC tablet, Take 50 mg by  mouth 2 (two) times daily as needed., Disp: , Rfl:    lisinopril-hydrochlorothiazide (ZESTORETIC) 10-12.5 MG tablet, Take 1 tablet by mouth daily., Disp: 30 tablet, Rfl: 10   metoprolol succinate (TOPROL XL) 25 MG 24 hr tablet, Take 1 tablet (25 mg total) by mouth in the morning., Disp: 90 tablet, Rfl: 0   nortriptyline (PAMELOR) 50 MG capsule, TAKE 1 CAPSULE (50 MG TOTAL) BY MOUTH AT BEDTIME., Disp: 90 capsule, Rfl: 2   omeprazole (PRILOSEC) 20 MG capsule, TAKE 1 CAPSULE BY MOUTH EVERY DAY, Disp: 30 capsule, Rfl: 10   sildenafil (REVATIO) 20 MG tablet, TAKE 2-5 TABS AS NEEDED FOR SEXUAL INTERCOURSE *MAX PER INSURANCE*, Disp: 15 tablet, Rfl: 3   aspirin EC 81 MG tablet, Take 1 tablet (81 mg total) by mouth daily. Swallow whole., Disp: 90 tablet, Rfl: 3  No orders of the defined types were placed in this encounter.   There are no Patient Instructions on file for this visit.   --Continue cardiac medications as reconciled in final medication list. --Return in about 1 year (around 09/27/2021) for Follow up coronary artery calcification and review risk factors. Or sooner if needed. --Continue follow-up with your primary care physician regarding the management of your other chronic comorbid conditions.  Patient's questions and concerns were addressed to his satisfaction. He voices understanding of the instructions provided during this encounter.   This note was created using a voice recognition software as a result there may be grammatical errors inadvertently enclosed that do not reflect the nature of this encounter. Every attempt is made to correct such errors.  Rex Kras, [place], [facility]  Pager: [phone] Office: [phone]

## 2020-09-30 ENCOUNTER — Encounter: Payer: Self-pay | Admitting: Internal Medicine

## 2020-10-04 NOTE — Telephone Encounter (Signed)
Will fill out when I get back to the office

## 2020-10-05 NOTE — Telephone Encounter (Signed)
Done, placed in MYD box 

## 2020-10-08 ENCOUNTER — Telehealth: Payer: Self-pay | Admitting: Internal Medicine

## 2020-10-08 NOTE — Telephone Encounter (Signed)
Please refax fax [phone] Surgical clearance to [facility]

## 2020-10-25 ENCOUNTER — Other Ambulatory Visit: Payer: Self-pay | Admitting: Internal Medicine

## 2020-11-01 ENCOUNTER — Other Ambulatory Visit: Payer: Self-pay | Admitting: Internal Medicine

## 2020-12-02 ENCOUNTER — Other Ambulatory Visit: Payer: Self-pay | Admitting: Cardiology

## 2020-12-02 ENCOUNTER — Other Ambulatory Visit: Payer: Self-pay | Admitting: Internal Medicine

## 2020-12-02 DIAGNOSIS — Z01810 Encounter for preprocedural cardiovascular examination: Secondary | ICD-10-CM

## 2020-12-02 DIAGNOSIS — R0609 Other forms of dyspnea: Secondary | ICD-10-CM

## 2020-12-02 DIAGNOSIS — Z0000 Encounter for general adult medical examination without abnormal findings: Secondary | ICD-10-CM

## 2020-12-02 DIAGNOSIS — I10 Essential (primary) hypertension: Secondary | ICD-10-CM

## 2020-12-24 ENCOUNTER — Other Ambulatory Visit: Payer: Self-pay | Admitting: Internal Medicine

## 2020-12-24 ENCOUNTER — Encounter: Payer: Self-pay | Admitting: Internal Medicine

## 2020-12-24 DIAGNOSIS — Z0000 Encounter for general adult medical examination without abnormal findings: Secondary | ICD-10-CM

## 2020-12-25 ENCOUNTER — Other Ambulatory Visit: Payer: Self-pay | Admitting: Internal Medicine

## 2020-12-26 NOTE — Telephone Encounter (Signed)
Last filled 08/2020 with 3 refills... please advise

## 2020-12-29 ENCOUNTER — Other Ambulatory Visit: Payer: Self-pay | Admitting: Internal Medicine

## 2020-12-29 DIAGNOSIS — Z01818 Encounter for other preprocedural examination: Secondary | ICD-10-CM

## 2020-12-30 ENCOUNTER — Other Ambulatory Visit (INDEPENDENT_AMBULATORY_CARE_PROVIDER_SITE_OTHER): Payer: 59

## 2020-12-30 ENCOUNTER — Other Ambulatory Visit: Payer: Self-pay

## 2020-12-30 DIAGNOSIS — Z01818 Encounter for other preprocedural examination: Secondary | ICD-10-CM

## 2020-12-31 LAB — HEMOGLOBIN A1C: Hgb A1c MFr Bld: 6.1 % (ref 4.6–6.5)

## 2020-12-31 LAB — CBC WITH DIFFERENTIAL/PLATELET
Basophils Absolute: 0.1 10*3/uL (ref 0.0–0.1)
Basophils Relative: 0.9 % (ref 0.0–3.0)
Eosinophils Absolute: 0.3 10*3/uL (ref 0.0–0.7)
Eosinophils Relative: 4.5 % (ref 0.0–5.0)
HCT: 38.7 % — ABNORMAL LOW (ref 39.0–52.0)
Hemoglobin: 13 g/dL (ref 13.0–17.0)
Lymphocytes Relative: 30.9 % (ref 12.0–46.0)
Lymphs Abs: 2.2 10*3/uL (ref 0.7–4.0)
MCHC: 33.6 g/dL (ref 30.0–36.0)
MCV: 80 fl (ref 78.0–100.0)
Monocytes Absolute: 0.5 10*3/uL (ref 0.1–1.0)
Monocytes Relative: 7.7 % (ref 3.0–12.0)
Neutro Abs: 4 10*3/uL (ref 1.4–7.7)
Neutrophils Relative %: 56 % (ref 43.0–77.0)
Platelets: 318 10*3/uL (ref 150.0–400.0)
RBC: 4.83 Mil/uL (ref 4.22–5.81)
RDW: 14.4 % (ref 11.5–15.5)
WBC: 7.1 10*3/uL (ref 4.0–10.5)

## 2020-12-31 LAB — COMPREHENSIVE METABOLIC PANEL
ALT: 27 U/L (ref 0–53)
AST: 18 U/L (ref 0–37)
Albumin: 4.4 g/dL (ref 3.5–5.2)
Alkaline Phosphatase: 108 U/L (ref 39–117)
BUN: 16 mg/dL (ref 6–23)
CO2: 34 mEq/L — ABNORMAL HIGH (ref 19–32)
Calcium: 9.8 mg/dL (ref 8.4–10.5)
Chloride: 100 mEq/L (ref 96–112)
Creatinine, Ser: 1.15 mg/dL (ref 0.40–1.50)
GFR: 74.24 mL/min (ref >60.00)
Glucose, Bld: 130 mg/dL — ABNORMAL HIGH (ref 70–99)
Potassium: 4.1 mEq/L (ref 3.5–5.1)
Sodium: 140 mEq/L (ref 135–145)
Total Bilirubin: 0.4 mg/dL (ref 0.2–1.2)
Total Protein: 7.1 g/dL (ref 6.0–8.3)

## 2020-12-31 LAB — PROTIME-INR
INR: 1 ratio (ref 0.8–1.0)
Prothrombin Time: 11.4 s (ref 9.6–13.1)

## 2021-01-07 ENCOUNTER — Encounter: Payer: Self-pay | Admitting: Internal Medicine

## 2021-01-07 ENCOUNTER — Other Ambulatory Visit: Payer: Self-pay | Admitting: Internal Medicine

## 2021-01-07 ENCOUNTER — Telehealth (INDEPENDENT_AMBULATORY_CARE_PROVIDER_SITE_OTHER): Payer: 59 | Admitting: Internal Medicine

## 2021-01-07 DIAGNOSIS — R300 Dysuria: Secondary | ICD-10-CM

## 2021-01-07 DIAGNOSIS — R509 Fever, unspecified: Secondary | ICD-10-CM | POA: Diagnosis not present

## 2021-01-07 LAB — POC URINALSYSI DIPSTICK (AUTOMATED)
Bilirubin, UA: NEGATIVE
Glucose, UA: NEGATIVE
Ketones, UA: NEGATIVE
Nitrite, UA: POSITIVE
Protein, UA: POSITIVE — AB
Spec Grav, UA: 1.01 (ref 1.010–1.025)
Urobilinogen, UA: 1 E.U./dL
pH, UA: 8.5 — AB (ref 5.0–8.0)

## 2021-01-07 MED ORDER — SULFAMETHOXAZOLE-TRIMETHOPRIM 400-80 MG PO TABS: 1.0000 | ORAL_TABLET | Freq: Two times a day (BID) | ORAL | 0 refills | Status: DC

## 2021-01-07 NOTE — Patient Instructions (Signed)
Urinary Tract Infection, Adult A urinary tract infection (UTI) is an infection of any part of the urinary tract. The urinary tract includes:  The kidneys.  The ureters.  The bladder.  The urethra. These organs make, store, and get rid of pee (urine) in the body. What are the causes? This infection is caused by germs (bacteria) in your genital area. These germs grow and cause swelling (inflammation) of your urinary tract. What increases the risk? The following factors may make you more likely to develop this condition:  Using a small, thin tube (catheter) to drain pee.  Not being able to control when you pee or poop (incontinence).  Being male. If you are male, these things can increase the risk: ? Using these methods to prevent pregnancy:  A medicine that kills sperm (spermicide).  A device that blocks sperm (diaphragm). ? Having low levels of a male hormone (estrogen). ? Being pregnant. You are more likely to develop this condition if:  You have genes that add to your risk.  You are sexually active.  You take antibiotic medicines.  You have trouble peeing because of: ? A prostate that is bigger than normal, if you are male. ? A blockage in the part of your body that drains pee from the bladder. ? A kidney stone. ? A nerve condition that affects your bladder. ? Not getting enough to drink. ? Not peeing often enough.  You have other conditions, such as: ? Diabetes. ? A weak disease-fighting system (immune system). ? Sickle cell disease. ? Gout. ? Injury of the spine. What are the signs or symptoms? Symptoms of this condition include:  Needing to pee right away.  Peeing small amounts often.  Pain or burning when peeing.  Blood in the pee.  Pee that smells bad or not like normal.  Trouble peeing.  Pee that is cloudy.  Fluid coming from the vagina, if you are male.  Pain in the belly or lower back. Other symptoms include:  Vomiting.  Not  feeling hungry.  Feeling mixed up (confused). This may be the first symptom in older adults.  Being tired and grouchy (irritable).  A fever.  Watery poop (diarrhea). How is this treated?  Taking antibiotic medicine.  Taking other medicines.  Drinking enough water. In some cases, you may need to see a specialist. Follow these instructions at home: Medicines  Take over-the-counter and prescription medicines only as told by your doctor.  If you were prescribed an antibiotic medicine, take it as told by your doctor. Do not stop taking it even if you start to feel better. General instructions  Make sure you: ? Pee until your bladder is empty. ? Do not hold pee for a long time. ? Empty your bladder after sex. ? Wipe from front to back after peeing or pooping if you are a male. Use each tissue one time when you wipe.  Drink enough fluid to keep your pee pale yellow.  Keep all follow-up visits.   Contact a doctor if:  You do not get better after 1-2 days.  Your symptoms go away and then come back. Get help right away if:  You have very bad back pain.  You have very bad pain in your lower belly.  You have a fever.  You have chills.  You feeling like you will vomit or you vomit. Summary  A urinary tract infection (UTI) is an infection of any part of the urinary tract.  This condition is caused by   germs in your genital area.  There are many risk factors for a UTI.  Treatment includes antibiotic medicines.  Drink enough fluid to keep your pee pale yellow. This information is not intended to replace advice given to you by your health care provider. Make sure you discuss any questions you have with your health care provider. Document Revised: 07/09/2020 Document Reviewed: 07/09/2020 Elsevier Patient Education  2021 Elsevier Inc.  

## 2021-01-07 NOTE — Progress Notes (Signed)
Virtual Visit via Video Note  I connected with Kenneth Sherman on 01/07/21 at  1:45 PM EST by a video enabled telemedicine application and verified that I am speaking with the correct person using two identifiers.  Location: Patient: Home Provider: Office  Persons participating in this video call: Webb Silversmith, NP and Kenneth Sherman.  I discussed the limitations of evaluation and management by telemedicine and the availability of in person appointments. The patient expressed understanding and agreed to proceed.  History of Present Illness:  Patient reports nasal congestion, dysuria, fever and chills. He reports this started yesterday. He is not blowing any mucus out of his nose. He denies urgency, frequency, blood in his urine, testicular pain or swelling, penile discharge or lesion, abdominal pain, low back pain, nausea or vomiting. He has had fever up to 102 and had chills. He has tried Tylenol OTC with some relief of symptoms. He has had kidney stones in the past but reports this feels different. He he has had his COVID vaccines.   Past Medical History:  Diagnosis Date  . Arthritis   . Coronary atherosclerosis due to calcified coronary lesion   . Heartburn   . HLD (hyperlipidemia)   . Hypertension    no meds  . Hypogonadism in male   . Right knee DJD   . Thyroid disease     Current Outpatient Medications  Medication Sig Dispense Refill  . aspirin EC 81 MG tablet Take 1 tablet (81 mg total) by mouth daily. Swallow whole. 90 tablet 3  . atorvastatin (LIPITOR) 10 MG tablet Take 1 tablet (10 mg total) by mouth daily. 30 tablet 2  . diclofenac (VOLTAREN) 50 MG EC tablet Take 50 mg by mouth 2 (two) times daily as needed.    Marland Kitchen lisinopril-hydrochlorothiazide (ZESTORETIC) 10-12.5 MG tablet TAKE 1 TABLET BY MOUTH EVERY DAY 30 tablet 2  . metoprolol succinate (TOPROL-XL) 25 MG 24 hr tablet TAKE 1 TABLET (25 MG TOTAL) BY MOUTH IN THE MORNING. 90 tablet 0  . nortriptyline (PAMELOR) 50 MG  capsule TAKE 1 CAPSULE BY MOUTH AT BEDTIME. 90 capsule 0  . omeprazole (PRILOSEC) 20 MG capsule TAKE 1 CAPSULE BY MOUTH EVERY DAY 30 capsule 10  . sildenafil (REVATIO) 20 MG tablet TAKE 2-5 TABS AS NEEDED FOR SEXUAL INTERCOURSE *MAX PER INSURANCE* 15 tablet 3  . sulfamethoxazole-trimethoprim (BACTRIM) 400-80 MG tablet Take 1 tablet by mouth 2 (two) times daily. 14 tablet 0   No current facility-administered medications for this visit.    No Known Allergies  Family History  Problem Relation Age of Onset  . Heart attack Mother   . Hypertension Mother   . Hypertension Father   . Heart attack Father   . Hypertension Brother   . Kidney disease Neg Hx   . Prostate cancer Neg Hx   . Bladder Cancer Neg Hx     Social History   Socioeconomic History  . Marital status: Married    Spouse name: Not on file  . Number of children: Not on file  . Years of education: 47  . Highest education level: Not on file  Occupational History  . Occupation: CAD Games developer: [employer]  Tobacco Use  . Smoking status: Never Smoker  . Smokeless tobacco: Never Used  Vaping Use  . Vaping Use: Never used  Substance and Sexual Activity  . Alcohol use: Yes    Alcohol/week: 2.0 standard drinks    Types: 1 Glasses of wine, 1  Cans of beer per week    Comment: occasionally  . Drug use: No  . Sexual activity: Yes    Birth control/protection: Condom  Other Topics Concern  . Not on file  Social History Narrative   Regular exercise-no   Caffeine Use-yes   Social Determinants of Health   Financial Resource Strain: Not on file  Food Insecurity: Not on file  Transportation Needs: Not on file  Physical Activity: Not on file  Stress: Not on file  Social Connections: Not on file  Intimate Partner Violence: Not on file     Constitutional: Patient reports fever and chills. Denies malaise, fatigue, headache or abrupt weight changes.  HEENT: Patient reports nasal congestion. Denies eye pain,  eye redness, ear pain, ringing in the ears, wax buildup, runny nose, bloody nose, or sore throat. Respiratory: Denies difficulty breathing, shortness of breath, cough or sputum production.   Cardiovascular: Denies chest pain, chest tightness, palpitations or swelling in the hands or feet.  Gastrointestinal: Denies abdominal pain, bloating, constipation, diarrhea or blood in the stool.  GU: Patient reports dysuria. Denies urgency, burning sensation, blood in urine, odor or discharge.  No other specific complaints in a complete review of systems (except as listed in HPI above).  Observations/Objective: There were no vitals taken for this visit. Wt Readings from Last 3 Encounters:  09/27/20 284 lb (128.8 kg)  09/27/20 286 lb (129.7 kg)  09/09/20 285 lb (129.3 kg)    General: Appears his stated age, obese, in NAD. HEENT: Head: normal shape and size; Nose: No congestion noted.; Throat/Mouth: No hoarseness noted. Pulmonary/Chest: Normal effort. No respiratory distress.   Neurological: Alert and oriented.   BMET    Component Value Date/Time   NA 140 12/30/2020 1600   K 4.1 12/30/2020 1600   CL 100 12/30/2020 1600   CO2 34 (H) 12/30/2020 1600   GLUCOSE 130 (H) 12/30/2020 1600   BUN 16 12/30/2020 1600   CREATININE 1.15 12/30/2020 1600   CREATININE 1.22 08/04/2016 1525   CALCIUM 9.8 12/30/2020 1600   GFRNONAA >60 06/11/2018 1018   GFRAA >60 06/11/2018 1018    Lipid Panel     Component Value Date/Time   CHOL 157 03/26/2020 0849   TRIG 99.0 03/26/2020 0849   HDL 49.00 03/26/2020 0849   CHOLHDL 3 03/26/2020 0849   VLDL 19.8 03/26/2020 0849   LDLCALC 89 03/26/2020 0849    CBC    Component Value Date/Time   WBC 7.1 12/30/2020 1600   RBC 4.83 12/30/2020 1600   HGB 13.0 12/30/2020 1600   HGB 14.8 12/07/2017 0847   HCT 38.7 (L) 12/30/2020 1600   HCT 43.4 12/07/2017 0847   PLT 318.0 12/30/2020 1600   MCV 80.0 12/30/2020 1600   MCH 26.9 06/11/2018 1018   MCHC 33.6 12/30/2020  1600   RDW 14.4 12/30/2020 1600   LYMPHSABS 2.2 12/30/2020 1600   MONOABS 0.5 12/30/2020 1600   EOSABS 0.3 12/30/2020 1600   BASOSABS 0.1 12/30/2020 1600    Hgb A1C Lab Results  Component Value Date   HGBA1C 6.1 12/30/2020        Assessment and Plan:  Dysuria, Fever and Chills:  Urinalysis: 1+ leuks, 2+ blood, positive nitrites We will send urine culture Rx for Septra 1 tab p.o. twice daily x7 days Push fluids  Return/ER precautions discussed  Follow Up Instructions:    I discussed the assessment and treatment plan with the patient. The patient was provided an opportunity to ask questions and all  were answered. The patient agreed with the plan and demonstrated an understanding of the instructions.   The patient was advised to call back or seek an in-person evaluation if the symptoms worsen or if the condition fails to improve as anticipated.     Webb Silversmith, NP

## 2021-01-09 ENCOUNTER — Encounter: Payer: Self-pay | Admitting: Internal Medicine

## 2021-01-09 LAB — URINE CULTURE
MICRO NUMBER:: 11470162
SPECIMEN QUALITY:: ADEQUATE

## 2021-01-13 ENCOUNTER — Encounter: Payer: Self-pay | Admitting: Internal Medicine

## 2021-01-13 ENCOUNTER — Telehealth: Payer: Self-pay | Admitting: Internal Medicine

## 2021-01-13 NOTE — Telephone Encounter (Signed)
Pt came in office and dropped off forms for surgical clearance. Stated two parts missing. Placed in inbox.

## 2021-01-13 NOTE — Telephone Encounter (Signed)
Kenneth Sherman from [place] would like to discuss the most recent labs that were done on the patient for surgical Clearance. EM

## 2021-01-17 NOTE — Telephone Encounter (Signed)
Done, given to MYD to fax

## 2021-01-17 NOTE — Telephone Encounter (Signed)
Forms were faxed Friday, but all the attempts stated there was no answer. I have refaxed the form today x 3 and it says it was received

## 2021-01-22 ENCOUNTER — Other Ambulatory Visit: Payer: Self-pay | Admitting: Internal Medicine

## 2021-02-23 ENCOUNTER — Other Ambulatory Visit: Payer: Self-pay

## 2021-02-23 ENCOUNTER — Other Ambulatory Visit: Payer: Self-pay | Admitting: Cardiology

## 2021-02-23 DIAGNOSIS — I10 Essential (primary) hypertension: Secondary | ICD-10-CM

## 2021-02-23 DIAGNOSIS — R0609 Other forms of dyspnea: Secondary | ICD-10-CM

## 2021-02-23 DIAGNOSIS — R0600 Dyspnea, unspecified: Secondary | ICD-10-CM

## 2021-02-23 DIAGNOSIS — Z01810 Encounter for preprocedural cardiovascular examination: Secondary | ICD-10-CM

## 2021-02-24 ENCOUNTER — Ambulatory Visit (INDEPENDENT_AMBULATORY_CARE_PROVIDER_SITE_OTHER): Payer: 59 | Admitting: Internal Medicine

## 2021-02-24 ENCOUNTER — Encounter: Payer: Self-pay | Admitting: Internal Medicine

## 2021-02-24 VITALS — BP 124/78 | HR 83 | Temp 97.6°F | Ht 70.75 in | Wt 283.0 lb

## 2021-02-24 DIAGNOSIS — E7800 Pure hypercholesterolemia, unspecified: Secondary | ICD-10-CM

## 2021-02-24 DIAGNOSIS — N528 Other male erectile dysfunction: Secondary | ICD-10-CM

## 2021-02-24 DIAGNOSIS — R519 Headache, unspecified: Secondary | ICD-10-CM

## 2021-02-24 DIAGNOSIS — Z0000 Encounter for general adult medical examination without abnormal findings: Secondary | ICD-10-CM

## 2021-02-24 DIAGNOSIS — F411 Generalized anxiety disorder: Secondary | ICD-10-CM | POA: Diagnosis not present

## 2021-02-24 DIAGNOSIS — Z1211 Encounter for screening for malignant neoplasm of colon: Secondary | ICD-10-CM

## 2021-02-24 DIAGNOSIS — Z0001 Encounter for general adult medical examination with abnormal findings: Secondary | ICD-10-CM

## 2021-02-24 DIAGNOSIS — I10 Essential (primary) hypertension: Secondary | ICD-10-CM

## 2021-02-24 DIAGNOSIS — E349 Endocrine disorder, unspecified: Secondary | ICD-10-CM

## 2021-02-24 DIAGNOSIS — K219 Gastro-esophageal reflux disease without esophagitis: Secondary | ICD-10-CM

## 2021-02-24 DIAGNOSIS — M170 Bilateral primary osteoarthritis of knee: Secondary | ICD-10-CM

## 2021-02-24 MED ORDER — ATORVASTATIN CALCIUM 10 MG PO TABS: 10.0000 mg | ORAL_TABLET | Freq: Every day | ORAL | 0 refills | Status: DC

## 2021-02-24 NOTE — Progress Notes (Signed)
Subjective:    Patient ID: Kenneth Sherman, male    DOB: [birth year], 50 y.o.   MRN: [id number]  HPI  Patient presents the clinic today for his annual exam.  He is also due to follow-up chronic conditions.  Frequent Headaches: He denies breakthrough headaches on Nortriptyline and Metoprolol.  He does not follow with neurology.  ED: He has difficulty maintaining erection.  He takes Sildenafil as needed with good results.  GAD: Currently not an issue off meds.  He does not see a therapist.  He denies depression, SI/HI.  GERD: He denies breakthrough on Omeprazole.  There is no upper GI on file.  HTN: His BP today is 124/78.  He is taking Lisinopril HCT and Metoprolol as prescribed.  ECG from 11/2012 reviewed.  HLD: His last LDL was 89, 03/2020.  He denies myalgias on Atorvastatin.  He does not consume a low-fat diet.  Hypotestosteronism: He is currently not being treated for this.  He follows with urology as needed.  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  He takes Ibuprofen as needed with good relief of symptoms.  Flu: Never Tetanus: 11/2012 Covid: Pfizer x 3 PSA screening: 10/2019 Colon cancer screening: Never Vision screening: Annually Dentist: Annually  Diet: He does eat meat.  He consumes fruits and vegetables.  He does eat some fried foods. Exercise: None Review of Systems      Past Medical History:  Diagnosis Date  . Arthritis   . Coronary atherosclerosis due to calcified coronary lesion   . Heartburn   . HLD (hyperlipidemia)   . Hypertension    no meds  . Hypogonadism in male   . Right knee DJD   . Thyroid disease     Current Outpatient Medications  Medication Sig Dispense Refill  . aspirin EC 81 MG tablet Take 1 tablet (81 mg total) by mouth daily. Swallow whole. 90 tablet 3  . atorvastatin (LIPITOR) 10 MG tablet Take 1 tablet (10 mg total) by mouth daily. 30 tablet 2  . diclofenac (VOLTAREN) 50 MG EC tablet Take 50 mg by mouth 2 (two) times daily as needed.    Marland Kitchen  lisinopril-hydrochlorothiazide (ZESTORETIC) 10-12.5 MG tablet TAKE 1 TABLET BY MOUTH EVERY DAY 30 tablet 2  . metoprolol succinate (TOPROL-XL) 25 MG 24 hr tablet TAKE 1 TABLET (25 MG TOTAL) BY MOUTH IN THE MORNING. 90 tablet 2  . nortriptyline (PAMELOR) 50 MG capsule TAKE 1 CAPSULE BY MOUTH EVERYDAY AT BEDTIME 30 capsule 2  . omeprazole (PRILOSEC) 20 MG capsule TAKE 1 CAPSULE BY MOUTH EVERY DAY 30 capsule 10  . sildenafil (REVATIO) 20 MG tablet TAKE 2-5 TABS AS NEEDED FOR SEXUAL INTERCOURSE *MAX PER INSURANCE* 15 tablet 3   No current facility-administered medications for this visit.    No Known Allergies  Family History  Problem Relation Age of Onset  . Heart attack Mother   . Hypertension Mother   . Hypertension Father   . Heart attack Father   . Hypertension Brother   . Kidney disease Neg Hx   . Prostate cancer Neg Hx   . Bladder Cancer Neg Hx     Social History   Socioeconomic History  . Marital status: Married    Spouse name: Not on file  . Number of children: Not on file  . Years of education: 26  . Highest education level: Not on file  Occupational History  . Occupation: CAD Games developer: [employer]  Tobacco Use  . Smoking  status: Never Smoker  . Smokeless tobacco: Never Used  Vaping Use  . Vaping Use: Never used  Substance and Sexual Activity  . Alcohol use: Yes    Alcohol/week: 2.0 standard drinks    Types: 1 Glasses of wine, 1 Cans of beer per week    Comment: occasionally  . Drug use: No  . Sexual activity: Yes    Birth control/protection: Condom  Other Topics Concern  . Not on file  Social History Narrative   Regular exercise-no   Caffeine Use-yes   Social Determinants of Health   Financial Resource Strain: Not on file  Food Insecurity: Not on file  Transportation Needs: Not on file  Physical Activity: Not on file  Stress: Not on file  Social Connections: Not on file  Intimate Partner Violence: Not on file     Constitutional:  Denies fever, malaise, fatigue, headache or abrupt weight changes.  HEENT: Denies eye pain, eye redness, ear pain, ringing in the ears, wax buildup, runny nose, nasal congestion, bloody nose, or sore throat. Respiratory: Denies difficulty breathing, shortness of breath, cough or sputum production.   Cardiovascular: Denies chest pain, chest tightness, palpitations or swelling in the hands or feet.  Gastrointestinal: Denies abdominal pain, bloating, constipation, diarrhea or blood in the stool.  GU: Denies urgency, frequency, pain with urination, burning sensation, blood in urine, odor or discharge. Musculoskeletal: Patient reports intermittent joint pain.  Denies decrease in range of motion, difficulty with gait, muscle pain or joint swelling.  Skin: Denies redness, rashes, lesions or ulcercations.  Neurological: Denies dizziness, difficulty with memory, difficulty with speech or problems with balance and coordination.  Psych: Patient has a anxiety. Denies depression, SI/HI.  No other specific complaints in a complete review of systems (except as listed in HPI above).  Objective:   Physical Exam  BP 124/78   Pulse 83   Temp 97.6 F (36.4 C) (Temporal)   Ht 5' 10.75" (1.797 m)   Wt 128.4 kg   SpO2 98%   BMI 39.75 kg/m   Wt Readings from Last 3 Encounters:  02/24/21 283 lb (128.4 kg)  09/27/20 284 lb (128.8 kg)  09/27/20 286 lb (129.7 kg)    General: Appears his stated age, obese in NAD. Skin: Warm, dry and intact. No rashes noted. HEENT: Head: normal shape and size; Eyes: sclera white, no icterus, conjunctiva pink, PERRLA and EOMs intact;  Neck:  Neck supple, trachea midline. No masses, lumps or thyromegaly present.  Cardiovascular: Normal rate and rhythm. S1,S2 noted.  No murmur, rubs or gallops noted. No JVD or BLE edema. No carotid bruits noted. Pulmonary/Chest: Normal effort and positive vesicular breath sounds. No respiratory distress. No wheezes, rales or ronchi noted.   Abdomen: Soft and nontender. Normal bowel sounds. No distention or masses noted. Liver, spleen and kidneys non palpable. Musculoskeletal: Strength 5/5 BUE/BLE.  No difficulty with gait.  Neurological: Alert and oriented. Cranial nerves II-XII grossly intact. Coordination normal.  Psychiatric: Mood and affect normal. Behavior is normal. Judgment and thought content normal.     BMET    Component Value Date/Time   NA 140 12/30/2020 1600   K 4.1 12/30/2020 1600   CL 100 12/30/2020 1600   CO2 34 (H) 12/30/2020 1600   GLUCOSE 130 (H) 12/30/2020 1600   BUN 16 12/30/2020 1600   CREATININE 1.15 12/30/2020 1600   CREATININE 1.22 08/04/2016 1525   CALCIUM 9.8 12/30/2020 1600   GFRNONAA >60 06/11/2018 1018   GFRAA >60 06/11/2018 1018  Lipid Panel     Component Value Date/Time   CHOL 157 03/26/2020 0849   TRIG 99.0 03/26/2020 0849   HDL 49.00 03/26/2020 0849   CHOLHDL 3 03/26/2020 0849   VLDL 19.8 03/26/2020 0849   LDLCALC 89 03/26/2020 0849    CBC    Component Value Date/Time   WBC 7.1 12/30/2020 1600   RBC 4.83 12/30/2020 1600   HGB 13.0 12/30/2020 1600   HGB 14.8 12/07/2017 0847   HCT 38.7 (L) 12/30/2020 1600   HCT 43.4 12/07/2017 0847   PLT 318.0 12/30/2020 1600   MCV 80.0 12/30/2020 1600   MCH 26.9 06/11/2018 1018   MCHC 33.6 12/30/2020 1600   RDW 14.4 12/30/2020 1600   LYMPHSABS 2.2 12/30/2020 1600   MONOABS 0.5 12/30/2020 1600   EOSABS 0.3 12/30/2020 1600   BASOSABS 0.1 12/30/2020 1600    Hgb A1C Lab Results  Component Value Date   HGBA1C 6.1 12/30/2020           Assessment & Plan:   Preventative Health Maintenance:  He declines flu shot today Tetanus UTD COVID UTD Referral to GI for screening colonoscopy Encouraged him to consume a balanced diet and exercise regimen Advised him to see an eye doctor and dentist annually He declines lab work today  RTC in 1 year, sooner if needed Webb Silversmith, NP. This visit occurred during the SARS-CoV-2  public health emergency.  Safety protocols were in place, including screening questions prior to the visit, additional usage of staff PPE, and extensive cleaning of exam room while observing appropriate contact time as indicated for disinfecting solutions.

## 2021-02-25 NOTE — Assessment & Plan Note (Signed)
No issues off meds We will monitor 

## 2021-02-25 NOTE — Assessment & Plan Note (Signed)
Controlled on nortriptyline and metoprolol Will monitor

## 2021-02-25 NOTE — Assessment & Plan Note (Signed)
C-Met and lipid profile reviewed Encouraged him to consume a low fat diet Continue atorvastatin

## 2021-02-25 NOTE — Assessment & Plan Note (Signed)
Continue omeprazole Encouraged him to avoid foods that trigger his reflux  Discussed how weight loss could help improve reflux symptoms

## 2021-02-25 NOTE — Assessment & Plan Note (Signed)
Continue Ibuprofen as needed Discussed how weight loss could help improve joint pain

## 2021-02-25 NOTE — Assessment & Plan Note (Signed)
Controlled on lisinopril HCT and metoprolol Reinforced DASH diet and exercise for weight loss

## 2021-02-25 NOTE — Patient Instructions (Signed)

## 2021-02-25 NOTE — Assessment & Plan Note (Signed)
Continue sildenafil as needed. 

## 2021-03-15 ENCOUNTER — Other Ambulatory Visit: Payer: Self-pay

## 2021-03-15 ENCOUNTER — Telehealth (INDEPENDENT_AMBULATORY_CARE_PROVIDER_SITE_OTHER): Payer: Self-pay | Admitting: Gastroenterology

## 2021-03-15 DIAGNOSIS — Z1211 Encounter for screening for malignant neoplasm of colon: Secondary | ICD-10-CM

## 2021-03-15 MED ORDER — NA SULFATE-K SULFATE-MG SULF 17.5-3.13-1.6 GM/177ML PO SOLN: 1.0000 | Freq: Once | ORAL | 0 refills | Status: AC

## 2021-03-15 NOTE — Progress Notes (Signed)
Gastroenterology Pre-Procedure Review  Request Date: fRIDAY 04/01/21 Requesting Physician: Dr. Bonna Gains  PATIENT REVIEW QUESTIONS: The patient responded to the following health history questions as indicated:    1. Are you having any GI issues? no 2. Do you have a personal history of Polyps? no 3. Do you have a family history of Colon Cancer or Polyps? no 4. Diabetes Mellitus? no 5. Joint replacements in the past 12 months?yes (November 2021 and Feb 2022 Knee Replacement) 6. Major health problems in the past 3 months?no 7. Any artificial heart valves, MVP, or defibrillator?no    MEDICATIONS & ALLERGIES:    Patient reports the following regarding taking any anticoagulation/antiplatelet therapy:   Plavix, Coumadin, Eliquis, Xarelto, Lovenox, Pradaxa, Brilinta, or Effient? no Aspirin? 81 mg daily  Patient confirms/reports the following medications:  Current Outpatient Medications  Medication Sig Dispense Refill  . aspirin EC 81 MG tablet Take 1 tablet (81 mg total) by mouth daily. Swallow whole. 90 tablet 3  . atorvastatin (LIPITOR) 10 MG tablet Take 1 tablet (10 mg total) by mouth daily. 90 tablet 0  . diclofenac (VOLTAREN) 50 MG EC tablet Take 50 mg by mouth 2 (two) times daily as needed.    Marland Kitchen lisinopril-hydrochlorothiazide (ZESTORETIC) 10-12.5 MG tablet TAKE 1 TABLET BY MOUTH EVERY DAY 30 tablet 2  . metoprolol succinate (TOPROL-XL) 25 MG 24 hr tablet TAKE 1 TABLET (25 MG TOTAL) BY MOUTH IN THE MORNING. 90 tablet 2  . nortriptyline (PAMELOR) 50 MG capsule TAKE 1 CAPSULE BY MOUTH EVERYDAY AT BEDTIME 30 capsule 2  . omeprazole (PRILOSEC) 20 MG capsule TAKE 1 CAPSULE BY MOUTH EVERY DAY 30 capsule 10  . sildenafil (REVATIO) 20 MG tablet TAKE 2-5 TABS AS NEEDED FOR SEXUAL INTERCOURSE *MAX PER INSURANCE* 15 tablet 3   No current facility-administered medications for this visit.    Patient confirms/reports the following allergies:  No Known Allergies  No orders of the defined types  were placed in this encounter.   AUTHORIZATION INFORMATION Primary Insurance: 1D#: Group #:  Secondary Insurance: 1D#: Group #:  SCHEDULE INFORMATION: Date: Friday 04/01/21 Time: Location:[facility]

## 2021-03-22 ENCOUNTER — Other Ambulatory Visit: Payer: Self-pay | Admitting: Internal Medicine

## 2021-03-22 DIAGNOSIS — Z0000 Encounter for general adult medical examination without abnormal findings: Secondary | ICD-10-CM

## 2021-04-01 ENCOUNTER — Encounter
Admission: RE | Disposition: A | Discharge status: Home / Self Care | Payer: Self-pay | Source: Home / Self Care | Attending: Gastroenterology

## 2021-04-01 ENCOUNTER — Ambulatory Visit: Payer: 59 | Admitting: Anesthesiology

## 2021-04-01 ENCOUNTER — Encounter: Payer: Self-pay | Admitting: Gastroenterology

## 2021-04-01 ENCOUNTER — Ambulatory Visit
Admission: RE | Admit: 2021-04-01 | Discharge: 2021-04-01 | Disposition: A | Discharge status: Home / Self Care | Payer: 59 | Attending: Gastroenterology | Admitting: Gastroenterology

## 2021-04-01 DIAGNOSIS — Z1211 Encounter for screening for malignant neoplasm of colon: Secondary | ICD-10-CM

## 2021-04-01 DIAGNOSIS — K648 Other hemorrhoids: Secondary | ICD-10-CM | POA: Diagnosis not present

## 2021-04-01 DIAGNOSIS — Z7982 Long term (current) use of aspirin: Secondary | ICD-10-CM | POA: Diagnosis not present

## 2021-04-01 DIAGNOSIS — Z79899 Other long term (current) drug therapy: Secondary | ICD-10-CM | POA: Insufficient documentation

## 2021-04-01 DIAGNOSIS — K635 Polyp of colon: Secondary | ICD-10-CM | POA: Diagnosis not present

## 2021-04-01 HISTORY — DX: Gastro-esophageal reflux disease without esophagitis: K21.9

## 2021-04-01 HISTORY — PX: COLONOSCOPY WITH PROPOFOL: SHX5780

## 2021-04-01 SURGERY — COLONOSCOPY WITH PROPOFOL
Anesthesia: General

## 2021-04-01 MED ORDER — PROPOFOL 10 MG/ML IV BOLUS
INTRAVENOUS | Status: AC
  Filled 2021-04-01: qty 20

## 2021-04-01 MED ORDER — PROPOFOL 500 MG/50ML IV EMUL
INTRAVENOUS | Status: AC
  Filled 2021-04-01: qty 50

## 2021-04-01 MED ORDER — PROPOFOL 500 MG/50ML IV EMUL
INTRAVENOUS | Status: DC | PRN
  Administered 2021-04-01: 150 ug/kg/min via INTRAVENOUS

## 2021-04-01 MED ORDER — SODIUM CHLORIDE 0.9 % IV SOLN
INTRAVENOUS | Status: DC
Start: 2021-04-01 — End: 2021-04-01

## 2021-04-01 NOTE — Transfer of Care (Signed)
Immediate Anesthesia Transfer of Care Note  Patient: Kenneth Sherman  Procedure(s) Performed: COLONOSCOPY WITH PROPOFOL (N/A )  Patient Location: PACU  Anesthesia Type:General  Level of Consciousness: awake, alert  and sedated  Airway & Oxygen Therapy: Patient Spontanous Breathing and Patient connected to nasal cannula oxygen  Post-op Assessment: Report given to RN and Post -op Vital signs reviewed and stable  Post vital signs: Reviewed and stable  Last Vitals:  Vitals Value Taken Time  BP    Temp    Pulse    Resp    SpO2      Last Pain:  Vitals:   04/01/21 0911  TempSrc: Temporal  PainSc: 0-No pain         Complications: No complications documented.

## 2021-04-01 NOTE — Anesthesia Procedure Notes (Signed)
Performed by: Cook-Martin, Anali Cabanilla Pre-anesthesia Checklist: Emergency Drugs available, Patient identified, Suction available, Patient being monitored and Timeout performed Patient Re-evaluated:Patient Re-evaluated prior to induction Oxygen Delivery Method: Simple face mask Preoxygenation: Pre-oxygenation with 100% oxygen Induction Type: IV induction Placement Confirmation: positive ETCO2 and CO2 detector       

## 2021-04-01 NOTE — H&P (Signed)
Vonda Antigua, MD [address] [place], [place], [place], [address] [id number], [place], [place], [place], [address] Phone: [phone]  Fax: [phone]  Primary Care Physician:  Jearld Fenton, NP   Pre-Procedure History & Physical: HPI:  Kenneth Sherman is a 50 y.o. male is here for a colonoscopy.   Past Medical History:  Diagnosis Date  . Arthritis   . Coronary atherosclerosis due to calcified coronary lesion   . GERD (gastroesophageal reflux disease)   . Heartburn   . HLD (hyperlipidemia)   . Hypertension    no meds  . Hypogonadism in male   . Right knee DJD   . Thyroid disease     Past Surgical History:  Procedure Laterality Date  . BICEPS TENDON REPAIR  05/2015  . KNEE ARTHROSCOPY W/ MENISCECTOMY  2008   left  . KNEE ARTHROSCOPY W/ MENISCECTOMY  2011   right  . PARTIAL KNEE ARTHROPLASTY  11/29/2012   Procedure: UNICOMPARTMENTAL KNEE;  Surgeon: Lorn Junes, MD;  Location: [facility];  Service: Orthopedics;  Laterality: Right;    Prior to Admission medications   Medication Sig Start Date End Date Taking? Authorizing Provider  aspirin EC 81 MG tablet Take 1 tablet (81 mg total) by mouth daily. Swallow whole. 09/27/20  Yes Tolia, Sunit, DO  atorvastatin (LIPITOR) 10 MG tablet Take 1 tablet (10 mg total) by mouth daily. 02/24/21  Yes Jearld Fenton, NP  lisinopril-hydrochlorothiazide (ZESTORETIC) 10-12.5 MG tablet TAKE 1 TABLET BY MOUTH EVERY DAY 03/22/21  Yes Baity, Coralie Keens, NP  metoprolol succinate (TOPROL-XL) 25 MG 24 hr tablet TAKE 1 TABLET (25 MG TOTAL) BY MOUTH IN THE MORNING. 02/23/21 05/24/21 Yes Tolia, Sunit, DO  nortriptyline (PAMELOR) 50 MG capsule TAKE 1 CAPSULE BY MOUTH EVERYDAY AT BEDTIME 01/25/21  Yes Baity, Coralie Keens, NP  omeprazole (PRILOSEC) 20 MG capsule TAKE 1 CAPSULE BY MOUTH EVERY DAY 11/01/20  Yes Jearld Fenton, NP  diclofenac (VOLTAREN) 50 MG EC tablet Take 50 mg by mouth 2 (two) times daily as needed. 06/04/20   [provider]   sildenafil (REVATIO) 20 MG tablet TAKE 2-5 TABS AS NEEDED FOR SEXUAL INTERCOURSE *MAX PER INSURANCE* 12/28/20   Jearld Fenton, NP    Allergies as of 03/15/2021  . (No Known Allergies)    Family History  Problem Relation Age of Onset  . Heart attack Mother   . Hypertension Mother   . Hypertension Father   . Heart attack Father   . Hypertension Brother   . Kidney disease Neg Hx   . Prostate cancer Neg Hx   . Bladder Cancer Neg Hx     Social History   Socioeconomic History  . Marital status: Married    Spouse name: Not on file  . Number of children: Not on file  . Years of education: 53  . Highest education level: Not on file  Occupational History  . Occupation: CAD Games developer: [employer]  Tobacco Use  . Smoking status: Never Smoker  . Smokeless tobacco: Never Used  Vaping Use  . Vaping Use: Never used  Substance and Sexual Activity  . Alcohol use: Yes    Alcohol/week: 2.0 standard drinks    Types: 1 Glasses of wine, 1 Cans of beer per week    Comment: occasionally  . Drug use: No  . Sexual activity: Yes    Birth control/protection: Condom  Other Topics Concern  . Not on file  Social  History Narrative   Regular exercise-no   Caffeine Use-yes   Social Determinants of Health   Financial Resource Strain: Not on file  Food Insecurity: Not on file  Transportation Needs: Not on file  Physical Activity: Not on file  Stress: Not on file  Social Connections: Not on file  Intimate Partner Violence: Not on file    Review of Systems: See HPI, otherwise negative ROS  Physical Exam: BP (!) 142/98   Pulse (!) 112   Temp (!) 97.3 F (36.3 C) (Temporal)   Resp 16   Ht 5\' 11"  (1.803 m)   Wt 127.5 kg   SpO2 97%   BMI 39.19 kg/m  General:   Alert,  pleasant and cooperative in NAD Head:  Normocephalic and atraumatic. Neck:  Supple; no masses or thyromegaly. Lungs:  Clear throughout to auscultation, normal respiratory effort.    Heart:  +S1, +S2,  Regular rate and rhythm, No edema. Abdomen:  Soft, nontender and nondistended. Normal bowel sounds, without guarding, and without rebound.   Neurologic:  Alert and  oriented x4;  grossly normal neurologically.  Impression/Plan: Kenneth Sherman is here for a colonoscopy to be performed for average risk screening.  Risks, benefits, limitations, and alternatives regarding  colonoscopy have been reviewed with the patient.  Questions have been answered.  All parties agreeable.   Virgel Manifold, MD  04/01/2021, 9:42 AM

## 2021-04-01 NOTE — Anesthesia Postprocedure Evaluation (Signed)
Anesthesia Post Note  Patient: Kenneth Sherman  Procedure(s) Performed: COLONOSCOPY WITH PROPOFOL (N/A )  Patient location during evaluation: Endoscopy Anesthesia Type: General Level of consciousness: awake and alert Pain management: pain level controlled Vital Signs Assessment: post-procedure vital signs reviewed and stable Respiratory status: spontaneous breathing and respiratory function stable Cardiovascular status: stable Anesthetic complications: no   No complications documented.   Last Vitals:  Vitals:   04/01/21 1036 04/01/21 1046  BP: 113/78 (!) 116/94  Pulse: 100 (!) 135  Resp: 20 (!) 21  Temp: (!) 36.2 C   SpO2: 94% 95%    Last Pain:  Vitals:   04/01/21 1046  TempSrc:   PainSc: 0-No pain                 Greogory Cornette K

## 2021-04-01 NOTE — Anesthesia Preprocedure Evaluation (Signed)
Anesthesia Evaluation  Patient identified by MRN, date of birth, ID band Patient awake    Reviewed: Allergy & Precautions, NPO status , Patient's Chart, lab work & pertinent test results  History of Anesthesia Complications Negative for: history of anesthetic complications  Airway Mallampati: II       Dental   Pulmonary neg sleep apnea, neg COPD, Not current smoker,           Cardiovascular hypertension, Pt. on medications (-) Past MI and (-) CHF (-) dysrhythmias (-) Valvular Problems/Murmurs     Neuro/Psych neg Seizures    GI/Hepatic Neg liver ROS, GERD  Medicated and Controlled,  Endo/Other  neg diabetes  Renal/GU negative Renal ROS     Musculoskeletal   Abdominal   Peds  Hematology   Anesthesia Other Findings   Reproductive/Obstetrics                             Anesthesia Physical Anesthesia Plan  ASA: II  Anesthesia Plan: General   Post-op Pain Management:    Induction: Intravenous  PONV Risk Score and Plan: 2 and Propofol infusion and TIVA  Airway Management Planned: Nasal Cannula  Additional Equipment:   Intra-op Plan:   Post-operative Plan:   Informed Consent: I have reviewed the patients History and Physical, chart, labs and discussed the procedure including the risks, benefits and alternatives for the proposed anesthesia with the patient or authorized representative who has indicated his/her understanding and acceptance.       Plan Discussed with:   Anesthesia Plan Comments:         Anesthesia Quick Evaluation

## 2021-04-01 NOTE — Op Note (Signed)
[facility] Patient Name: Kenneth Sherman Procedure Date: 04/01/2021 9:41 AM MRN: [id number] Account #: [id number] Date of Birth: [birth year] Admit Type: Outpatient Age: 50 Room: [facility] ENDO ROOM 1 Gender: Male Note Status: Finalized Procedure:             Colonoscopy Indications:           Screening for colorectal malignant neoplasm Providers:             Jakyrie Totherow B. Bonna Gains MD, MD Medicines:             Monitored Anesthesia Care Complications:         No immediate complications. Procedure:             Pre-Anesthesia Assessment:                        - ASA Grade Assessment: II - A patient with mild                         systemic disease.                        - Prior to the procedure, a History and Physical was                         performed, and patient medications, allergies and                         sensitivities were reviewed. The patient's tolerance                         of previous anesthesia was reviewed.                        - The risks and benefits of the procedure and the                         sedation options and risks were discussed with the                         patient. All questions were answered and informed                         consent was obtained.                        - Patient identification and proposed procedure were                         verified prior to the procedure by the physician, the                         nurse, the anesthesiologist, the anesthetist and the                         technician. The procedure was verified in the                         procedure room.  After obtaining informed consent, the colonoscope was                         passed under direct vision. Throughout the procedure,                         the patient's blood pressure, pulse, and oxygen                         saturations were monitored continuously. The                         Colonoscope was  introduced through the anus and                         advanced to the the cecum, identified by appendiceal                         orifice and ileocecal valve. The colonoscopy was                         performed with ease. The patient tolerated the                         procedure well. The quality of the bowel preparation                         was good. Findings:      The perianal and digital rectal examinations were normal.      A 6 mm polyp was found in the sigmoid colon. The polyp was sessile. The       polyp was removed with a cold snare. Resection and retrieval were       complete.      The exam was otherwise without abnormality.      The rectum, sigmoid colon, descending colon, transverse colon, ascending       colon and cecum appeared normal.      Non-bleeding internal hemorrhoids were found during retroflexion. Impression:            - One 6 mm polyp in the sigmoid colon, removed with a                         cold snare. Resected and retrieved.                        - The examination was otherwise normal.                        - The rectum, sigmoid colon, descending colon,                         transverse colon, ascending colon and cecum are normal.                        - Non-bleeding internal hemorrhoids. Recommendation:        - Discharge patient to home (with escort).                        - Advance diet  as tolerated.                        - Continue present medications.                        - Await pathology results.                        - Repeat colonoscopy date to be determined after                         pending pathology results are reviewed.                        - The findings and recommendations were discussed with                         the patient.                        - The findings and recommendations were discussed with                         the patient's family.                        - Return to primary care physician as previously                          scheduled.                        - High fiber diet. Procedure Code(s):     --- Professional ---                        [phone], Colonoscopy, flexible; with removal of                         tumor(s), polyp(s), or other lesion(s) by snare                         technique Diagnosis Code(s):     --- Professional ---                        Z12.11, Encounter for screening for malignant neoplasm                         of colon                        K63.5, Polyp of colon CPT copyright 2019 American Medical Association. All rights reserved. The codes documented in this report are preliminary and upon coder review may  be revised to meet current compliance requirements.  Vonda Antigua, MD Margretta Sidle B. Bonna Gains MD, MD 04/01/2021 10:24:13 AM This report has been signed electronically. Number of Addenda: 0 Note Initiated On: 04/01/2021 9:41 AM Scope Withdrawal Time: 0 hours 24 minutes 34 seconds  Total Procedure Duration: 0 hours 26 minutes 50 seconds  Estimated Blood Loss:  Estimated blood loss: none.      [facility]

## 2021-04-04 ENCOUNTER — Encounter: Payer: Self-pay | Admitting: Gastroenterology

## 2021-04-04 LAB — SURGICAL PATHOLOGY

## 2021-04-06 ENCOUNTER — Encounter: Payer: Self-pay | Admitting: Gastroenterology

## 2021-04-19 ENCOUNTER — Other Ambulatory Visit: Payer: Self-pay | Admitting: Internal Medicine

## 2021-04-21 ENCOUNTER — Other Ambulatory Visit: Payer: Self-pay | Admitting: Internal Medicine

## 2021-04-21 NOTE — Telephone Encounter (Signed)
Last filled 12/28/2020 with 3 refills... please advise

## 2021-05-19 ENCOUNTER — Other Ambulatory Visit: Payer: Self-pay | Admitting: Internal Medicine

## 2021-08-16 ENCOUNTER — Other Ambulatory Visit: Payer: Self-pay | Admitting: Internal Medicine

## 2021-09-05 ENCOUNTER — Other Ambulatory Visit: Payer: Self-pay | Admitting: Family

## 2021-09-27 ENCOUNTER — Ambulatory Visit: Payer: 59 | Admitting: Cardiology

## 2021-09-27 ENCOUNTER — Other Ambulatory Visit: Payer: Self-pay | Admitting: Family

## 2021-10-07 ENCOUNTER — Ambulatory Visit: Payer: 59 | Admitting: Cardiology

## 2021-10-13 ENCOUNTER — Other Ambulatory Visit: Payer: Self-pay | Admitting: Internal Medicine

## 2021-10-13 MED ORDER — ATORVASTATIN CALCIUM 10 MG PO TABS: 10.0000 mg | ORAL_TABLET | Freq: Every day | ORAL | 1 refills | Status: DC

## 2021-10-25 ENCOUNTER — Ambulatory Visit: Payer: 59 | Admitting: Cardiology

## 2021-10-25 ENCOUNTER — Other Ambulatory Visit: Payer: Self-pay

## 2021-10-25 ENCOUNTER — Encounter: Payer: Self-pay | Admitting: Cardiology

## 2021-10-25 VITALS — BP 131/83 | HR 90 | Temp 98.0°F | Resp 16 | Ht 71.0 in | Wt 297.0 lb

## 2021-10-25 DIAGNOSIS — I10 Essential (primary) hypertension: Secondary | ICD-10-CM

## 2021-10-25 DIAGNOSIS — Z8249 Family history of ischemic heart disease and other diseases of the circulatory system: Secondary | ICD-10-CM

## 2021-10-25 DIAGNOSIS — R931 Abnormal findings on diagnostic imaging of heart and coronary circulation: Secondary | ICD-10-CM

## 2021-10-25 DIAGNOSIS — E782 Mixed hyperlipidemia: Secondary | ICD-10-CM

## 2021-10-25 NOTE — Progress Notes (Signed)
Date:  10/25/2021   ID:  Kenneth Sherman, DOB 05/15/1971, MRN [id number]  PCP:  Kenneth Fenton, NP  Cardiologist:  Kenneth Kras, DO, [facility] (established care 09/09/2020)  Date: 10/25/21 Last Office Visit: 09/27/2020   Chief Complaint  Patient presents with   Follow-up    1 year -coronary artery calcification    HPI  Kenneth Sherman is a 50 y.o. male who presents to the office with a chief complaint of " 1 year follow up for History of coronary artery calcification." Patient's past medical history and cardiovascular risk factors include: Family history of premature coronary artery disease, coronary artery calcification, hypertension, hyperlipidemia, obesity.   He is referred to the office at the request of Kenneth Fenton, NP for evaluation of preoperative stratification prior to upcoming noncardiac surgery.  Patient was originally referred to the office for preoperative risk stratification given his family history of premature CAD.  Work-up included coronary calcium score, echocardiogram, and stress test.  Patient was deemed to be appropriate candidate for knee replacement.  Since patient underwent bilateral knee replacements in November 2021 followed by February 2022.  Over the last 1 year patient is doing well from a cardiovascular standpoint.  He states randomly he does experience chest discomfort, occurs once couple weeks, left-sided, at times reproducible with palpation, nonexertional, lasting for 15 to 20 minutes, and self-limited.  He denies any shortness of breath at rest or with effort related activities.  Patient is aware that he has gained weight since last office visit.  Family history of premature coronary disease (mom passed at the age of 35 and dad passed away at the age of 83 due to heart disease).   FUNCTIONAL STATUS: No structured exercise program or daily routine. His is able to get in 6,000 steps per day.    ALLERGIES: No Known Allergies  MEDICATION LIST PRIOR TO  VISIT: Current Meds  Medication Sig   atorvastatin (LIPITOR) 10 MG tablet Take 1 tablet (10 mg total) by mouth daily.   diclofenac (VOLTAREN) 50 MG EC tablet Take 50 mg by mouth 2 (two) times daily as needed.   lisinopril-hydrochlorothiazide (ZESTORETIC) 10-12.5 MG tablet TAKE 1 TABLET BY MOUTH EVERY DAY   metoprolol succinate (TOPROL-XL) 25 MG 24 hr tablet TAKE 1 TABLET (25 MG TOTAL) BY MOUTH IN THE MORNING.   nortriptyline (PAMELOR) 50 MG capsule TAKE 1 CAPSULE BY MOUTH EVERYDAY AT BEDTIME   omeprazole (PRILOSEC) 20 MG capsule TAKE 1 CAPSULE BY MOUTH EVERY DAY   sildenafil (REVATIO) 20 MG tablet TAKE 2-5 TABS AS NEEDED FOR SEXUAL INTERCOURSE *MAX PER INSURANCE*     PAST MEDICAL HISTORY: Past Medical History:  Diagnosis Date   Arthritis    Coronary atherosclerosis due to calcified coronary lesion    GERD (gastroesophageal reflux disease)    Heartburn    HLD (hyperlipidemia)    Hypertension    no meds   Hypogonadism in male    Right knee DJD    Thyroid disease     PAST SURGICAL HISTORY: Past Surgical History:  Procedure Laterality Date   BICEPS TENDON REPAIR  05/2015   COLONOSCOPY WITH PROPOFOL N/A 04/01/2021   Procedure: COLONOSCOPY WITH PROPOFOL;  Surgeon: Kenneth Manifold, MD;  Location: [facility] ENDOSCOPY;  Service: Endoscopy;  Laterality: N/A;   KNEE ARTHROSCOPY W/ MENISCECTOMY  2008   left   KNEE ARTHROSCOPY W/ MENISCECTOMY  2011   right   PARTIAL KNEE ARTHROPLASTY  11/29/2012   Procedure: UNICOMPARTMENTAL KNEE;  Surgeon: Kenneth Sherman  Kenneth Chapel, MD;  Location: [facility];  Service: Orthopedics;  Laterality: Right;    FAMILY HISTORY: The patient family history includes Heart attack in his father and mother; Hypertension in his brother, father, and mother.  SOCIAL HISTORY:  The patient  reports that he has never smoked. He has never used smokeless tobacco. He reports current alcohol use of about 2.0 standard drinks per week. He reports that he does not use drugs.  REVIEW OF  SYSTEMS: Review of Systems  Constitutional: Negative for chills and fever.  HENT:  Negative for hoarse voice and nosebleeds.   Eyes:  Negative for discharge, double vision and pain.  Cardiovascular:  Negative for chest pain, claudication, dyspnea on exertion, leg swelling, near-syncope, orthopnea, palpitations, paroxysmal nocturnal dyspnea and syncope.  Respiratory:  Negative for hemoptysis and shortness of breath.   Musculoskeletal:  Negative for muscle cramps and myalgias.  Gastrointestinal:  Negative for abdominal pain, constipation, diarrhea, hematemesis, hematochezia, melena, nausea and vomiting.  Neurological:  Negative for dizziness and light-headedness.   PHYSICAL EXAM: Vitals with BMI 10/25/2021 04/01/2021 04/01/2021  Height 5\' 11"  - -  Weight 297 lbs - -  BMI 85.27 - -  Systolic 782 423 536  Diastolic 83 94 78  Pulse 90 135 100    CONSTITUTIONAL: Well-developed and well-nourished. No acute distress.  SKIN: Skin is warm and dry. No rash noted. No cyanosis. No pallor. No jaundice HEAD: Normocephalic and atraumatic.  EYES: No scleral icterus MOUTH/THROAT: Moist oral membranes.  NECK: No JVD present. No thyromegaly noted. No carotid bruits  LYMPHATIC: No visible cervical adenopathy.  CHEST Normal respiratory effort. No intercostal retractions  LUNGS: Clear to auscultation bilaterally. No stridor. No wheezes. No rales.  CARDIOVASCULAR: Regular rate and rhythm, positive S1-S2, no murmurs rubs or gallops appreciated. ABDOMINAL: Obese, soft, nontender, nondistended, positive bowel sounds all 4 quadrants.No apparent ascites.  EXTREMITIES: No peripheral edema  HEMATOLOGIC: No significant bruising NEUROLOGIC: Oriented to person, place, and time. Nonfocal. Normal muscle tone.  PSYCHIATRIC: Normal mood and affect. Normal behavior. Cooperative  CARDIAC DATABASE: EKG: 10/25/2021: NSR, 88 bpm, poor R wave progression, without underlying ischemia or injury pattern.     Echocardiogram: 09/16/2020: Left ventricle cavity is normal in size and wall thickness. Normal global wall motion. Normal LV systolic function with visual EF 50-55%. Normal diastolic filling pattern. Left atrial cavity is mildly dilated. Mild tricuspid regurgitation. No evidence of pulmonary hypertension.  Stress Testing: Lexiscan (Walking with Jaci Carrel) Sestamibi Stress Test 09/15/2020: Nondiagnostic ECG stress due to Lexiscan stress.  Diaphragmatic attenuation and apical thinning noted without ischemia.  Gated SPECT imaging of the left ventricle was normal. All segments of left ventricle demonstrated normal wall motion and thickening. No stress lung uptake. TID is normal. Stress LV EF is normal 57%.  No previous exam available for comparison. Low risk.   Coronary artery calcification scoring performed on 09/24/2020 at [facility]: Left main: 0 Left anterior descending: 11 Left circumflex: 1 Right coronary artery: 8 Total calcium score: 20 AU 50-75th percentile.  Noncardiac findings: Small sliding hiatal hernia.  Heart Catheterization: None  LABORATORY DATA: CBC Latest Ref Rng & Units 12/30/2020 11/04/2019 06/18/2018  WBC 4.0 - 10.5 K/uL 7.1 8.1 8.1  Hemoglobin 13.0 - 17.0 g/dL 13.0 14.0 13.3  Hematocrit 39.0 - 52.0 % 38.7(L) 41.6 38.4(L)  Platelets 150.0 - 400.0 K/uL 318.0 343.0 313.0    CMP Latest Ref Rng & Units 12/30/2020 03/26/2020 11/04/2019  Glucose 70 - 99 mg/dL 130(H) 106(H) 118(H)  BUN 6 -  23 mg/dL 16 15 12   Creatinine 0.40 - 1.50 mg/dL 1.15 1.13 1.01  Sodium 135 - 145 mEq/L 140 138 138  Potassium 3.5 - 5.1 mEq/L 4.1 4.8 4.2  Chloride 96 - 112 mEq/L 100 99 101  CO2 19 - 32 mEq/L 34(H) 32 31  Calcium 8.4 - 10.5 mg/dL 9.8 9.6 9.4  Total Protein 6.0 - 8.3 g/dL 7.1 7.6 7.6  Total Bilirubin 0.2 - 1.2 mg/dL 0.4 0.4 0.4  Alkaline Phos 39 - 117 U/L 108 85 77  AST 0 - 37 U/L 18 17 22   ALT 0 - 53 U/L 27 28 29     Lipid Panel     Component Value Date/Time   CHOL  157 03/26/2020 0849   TRIG 99.0 03/26/2020 0849   HDL 49.00 03/26/2020 0849   CHOLHDL 3 03/26/2020 0849   VLDL 19.8 03/26/2020 0849   LDLCALC 89 03/26/2020 0849   LDLDIRECT 157.0 06/04/2018 1543    No components found for: NTPROBNP No results for input(s): PROBNP in the last 8760 hours. No results for input(s): TSH in the last 8760 hours.   BMP Recent Labs    12/30/20 1600  NA 140  K 4.1  CL 100  CO2 34*  GLUCOSE 130*  BUN 16  CREATININE 1.15  CALCIUM 9.8    HEMOGLOBIN A1C Lab Results  Component Value Date   HGBA1C 6.1 12/30/2020    IMPRESSION:    ICD-10-CM   1. Agatston CAC score, <100  R93.1 EKG 12-Lead    2. Family history of early CAD  Z64.49     3. Benign hypertension  I10     4. Mixed hyperlipidemia  E78.2     5. Class 3 severe obesity due to excess calories without serious comorbidity with body mass index (BMI) of 40.0 to 44.9 in adult [facility])  E66.01    Z68.41        RECOMMENDATIONS: Kenneth Sherman is a 50 y.o. male whose past medical history and cardiac risk factors include: Family history of premature coronary artery disease, hypertension, hyperlipidemia, obesity.   Patient had establish care with the practice as part of his preoperative risk stratification for his upcoming orthopedic surgery.  He now presents for 1 year follow-up.  He is doing well from a cardiovascular standpoint.  He has atypical precordial discomfort which appears to be noncardiac in etiology. EKG shows normal sinus rhythm without underlying injury pattern. Reviewed the most recent coronary calcium report, echocardiogram, and stress test.  Given his symptoms and recent ischemic evaluation would not recommend repeat stress test at this time.  We did discuss considering coronary CTA as an alternative to stress testing.  However, the shared decision was to hold off on additional testing at this time.  However, if his symptoms were to increase in intensity, frequency, and/or duration he is  asked to seek medical attention by going to the closest ER via EMS for further evaluation.    From a cardiovascular standpoint emphasized the importance of improving his modifiable cardiovascular risk factors given his family history of premature CAD and his risk factors.  Patient is motivated with regards to starting to increase his physical activity as tolerated and consuming a heart healthy diet.  Recommend follow-up in 3 months or sooner if change in clinical status.  FINAL MEDICATION LIST END OF ENCOUNTER: No orders of the defined types were placed in this encounter.   Medications Discontinued During This Encounter  Medication Reason   aspirin EC 81 MG  tablet Error     Current Outpatient Medications:    atorvastatin (LIPITOR) 10 MG tablet, Take 1 tablet (10 mg total) by mouth daily., Disp: 90 tablet, Rfl: 1   diclofenac (VOLTAREN) 50 MG EC tablet, Take 50 mg by mouth 2 (two) times daily as needed., Disp: , Rfl:    lisinopril-hydrochlorothiazide (ZESTORETIC) 10-12.5 MG tablet, TAKE 1 TABLET BY MOUTH EVERY DAY, Disp: 90 tablet, Rfl: 3   metoprolol succinate (TOPROL-XL) 25 MG 24 hr tablet, TAKE 1 TABLET (25 MG TOTAL) BY MOUTH IN THE MORNING., Disp: 90 tablet, Rfl: 2   nortriptyline (PAMELOR) 50 MG capsule, TAKE 1 CAPSULE BY MOUTH EVERYDAY AT BEDTIME, Disp: 30 capsule, Rfl: 2   omeprazole (PRILOSEC) 20 MG capsule, TAKE 1 CAPSULE BY MOUTH EVERY DAY, Disp: 30 capsule, Rfl: 10   sildenafil (REVATIO) 20 MG tablet, TAKE 2-5 TABS AS NEEDED FOR SEXUAL INTERCOURSE *MAX PER INSURANCE*, Disp: 15 tablet, Rfl: 3  Orders Placed This Encounter  Procedures   EKG 12-Lead    There are no Patient Instructions on file for this visit.   --Continue cardiac medications as reconciled in final medication list. --Return in about 3 months (around 01/25/2022) for Reevaluation of, Chest pain. Or sooner if needed. --Continue follow-up with your primary care physician regarding the management of your other chronic  comorbid conditions.  Patient's questions and concerns were addressed to his satisfaction. He voices understanding of the instructions provided during this encounter.   This note was created using a voice recognition software as a result there may be grammatical errors inadvertently enclosed that do not reflect the nature of this encounter. Every attempt is made to correct such errors.  Kenneth Sherman, [place], [facility]  Pager: [phone] Office: [phone]

## 2021-11-01 ENCOUNTER — Other Ambulatory Visit: Payer: Self-pay | Admitting: Internal Medicine

## 2021-11-01 NOTE — Telephone Encounter (Signed)
Pt seen by Ms. Baity at [facility] 02/24/2021. Pt has upcoming appointment with Ms. Garnette Gunner at [place], 02/2022. Requested Prescriptions  Pending Prescriptions Disp Refills  . omeprazole (PRILOSEC) 20 MG capsule [Pharmacy Med Name: OMEPRAZOLE DR 20 MG CAPSULE] 30 capsule 10    Sig: TAKE 1 CAPSULE BY MOUTH EVERY DAY     Gastroenterology: Proton Pump Inhibitors Failed - 11/01/2021  9:11 PM      Failed - Valid encounter within last 12 months    Recent Outpatient Visits   None     Future Appointments            In 2 months Rex Kras, [facility]   In 3 months Baity, Coralie Keens, NP [facility], [facility]

## 2021-11-23 ENCOUNTER — Encounter: Payer: Self-pay | Admitting: Internal Medicine

## 2021-12-13 ENCOUNTER — Encounter: Payer: Self-pay | Admitting: Internal Medicine

## 2021-12-13 MED ORDER — NORTRIPTYLINE HCL 50 MG PO CAPS: 50.0000 mg | ORAL_CAPSULE | Freq: Every day | ORAL | 0 refills | Status: DC

## 2021-12-22 ENCOUNTER — Encounter: Payer: Self-pay | Admitting: Internal Medicine

## 2022-01-26 ENCOUNTER — Ambulatory Visit: Payer: 59 | Admitting: Cardiology

## 2022-02-09 ENCOUNTER — Encounter: Payer: Self-pay | Admitting: Internal Medicine

## 2022-02-09 NOTE — Telephone Encounter (Signed)
LMTCB 02/09/2022.  PEC please advise pt that Rollene Fare will order his labs at his physical.  Also there are a few appointment that day earlier in the afternoon if he would like to move his appointment up some.  ? ?Thanks,  ? ?-Mickel Baas  ?

## 2022-02-27 ENCOUNTER — Encounter: Payer: Self-pay | Admitting: Internal Medicine

## 2022-02-27 ENCOUNTER — Other Ambulatory Visit: Payer: Self-pay

## 2022-02-27 ENCOUNTER — Ambulatory Visit (INDEPENDENT_AMBULATORY_CARE_PROVIDER_SITE_OTHER): Payer: 59 | Admitting: Internal Medicine

## 2022-02-27 VITALS — BP 136/88 | HR 73 | Temp 98.0°F | Ht 71.0 in | Wt 300.0 lb

## 2022-02-27 DIAGNOSIS — Z114 Encounter for screening for human immunodeficiency virus [HIV]: Secondary | ICD-10-CM | POA: Diagnosis not present

## 2022-02-27 DIAGNOSIS — Z1159 Encounter for screening for other viral diseases: Secondary | ICD-10-CM

## 2022-02-27 DIAGNOSIS — R7303 Prediabetes: Secondary | ICD-10-CM

## 2022-02-27 DIAGNOSIS — Z0001 Encounter for general adult medical examination with abnormal findings: Secondary | ICD-10-CM

## 2022-02-27 DIAGNOSIS — E6609 Other obesity due to excess calories: Secondary | ICD-10-CM | POA: Insufficient documentation

## 2022-02-27 MED ORDER — OMEPRAZOLE 20 MG PO CPDR: DELAYED_RELEASE_CAPSULE | ORAL | 1 refills | Status: DC

## 2022-02-27 MED ORDER — ASPIRIN 81 MG PO TBEC: 81.0000 mg | DELAYED_RELEASE_TABLET | Freq: Every day | ORAL | 12 refills | Status: DC

## 2022-02-27 NOTE — Assessment & Plan Note (Signed)
Encourage diet and exercise for weight loss 

## 2022-02-27 NOTE — Progress Notes (Signed)
? ?Subjective:  ? ? Patient ID: Kenneth Sherman, male    DOB: 23-Jul-1971, 51 y.o.   MRN: [id number] ? ?HPI ? ?Pt presents to the clinic today for his annual exam. ? ?Flu: never ?Tetanus: 11/2012 ?Covid: Pfizer x 3 ?Shingrix: never ?PSA screening: never ?Colon screening: 03/2021 ?Vision screening: every 2 years ?Dentist: as needed ? ?Diet: He does eat meat. He consumes fruits and veggies. He does eat some fried foods. He drinks mostly soda. ?Exercise: None ? ?Review of Systems ? ?   ?Past Medical History:  ?Diagnosis Date  ? Arthritis   ? Coronary atherosclerosis due to calcified coronary lesion   ? GERD (gastroesophageal reflux disease)   ? Heartburn   ? HLD (hyperlipidemia)   ? Hypertension   ? no meds  ? Hypogonadism in male   ? Right knee DJD   ? Thyroid disease   ? ? ?Current Outpatient Medications  ?Medication Sig Dispense Refill  ? atorvastatin (LIPITOR) 10 MG tablet Take 1 tablet (10 mg total) by mouth daily. 90 tablet 1  ? diclofenac (VOLTAREN) 50 MG EC tablet Take 50 mg by mouth 2 (two) times daily as needed.    ? lisinopril-hydrochlorothiazide (ZESTORETIC) 10-12.5 MG tablet TAKE 1 TABLET BY MOUTH EVERY DAY 90 tablet 3  ? metoprolol succinate (TOPROL-XL) 25 MG 24 hr tablet TAKE 1 TABLET (25 MG TOTAL) BY MOUTH IN THE MORNING. 90 tablet 2  ? nortriptyline (PAMELOR) 50 MG capsule Take 1 capsule (50 mg total) by mouth at bedtime. 90 capsule 0  ? omeprazole (PRILOSEC) 20 MG capsule TAKE 1 CAPSULE BY MOUTH EVERY DAY 30 capsule 5  ? sildenafil (REVATIO) 20 MG tablet TAKE 2-5 TABS AS NEEDED FOR SEXUAL INTERCOURSE *MAX PER INSURANCE* 15 tablet 3  ? ?No current facility-administered medications for this visit.  ? ? ?No Known Allergies ? ?Family History  ?Problem Relation Age of Onset  ? Heart attack Mother   ? Hypertension Mother   ? Hypertension Father   ? Heart attack Father   ? Hypertension Brother   ? Kidney disease Neg Hx   ? Prostate cancer Neg Hx   ? Bladder Cancer Neg Hx   ? ? ?Social History  ? ?Socioeconomic  History  ? Marital status: Married  ?  Spouse name: Not on file  ? Number of children: Not on file  ? Years of education: 81  ? Highest education level: Not on file  ?Occupational History  ? Occupation: CAD Designer  ?  Employer: [employer]  ?Tobacco Use  ? Smoking status: Never  ? Smokeless tobacco: Never  ?Vaping Use  ? Vaping Use: Never used  ?Substance and Sexual Activity  ? Alcohol use: Yes  ?  Alcohol/week: 2.0 standard drinks  ?  Types: 1 Glasses of wine, 1 Cans of beer per week  ?  Comment: occasionally  ? Drug use: No  ? Sexual activity: Yes  ?  Birth control/protection: Condom  ?Other Topics Concern  ? Not on file  ?Social History Narrative  ? Regular exercise-no  ? Caffeine Use-yes  ? ?Social Determinants of Health  ? ?Financial Resource Strain: Not on file  ?Food Insecurity: Not on file  ?Transportation Needs: Not on file  ?Physical Activity: Not on file  ?Stress: Not on file  ?Social Connections: Not on file  ?Intimate Partner Violence: Not on file  ? ? ? ?Constitutional: Denies fever, malaise, fatigue, headache or abrupt weight changes.  ?HEENT: Denies eye pain, eye redness, ear  pain, ringing in the ears, wax buildup, runny nose, nasal congestion, bloody nose, or sore throat. ?Respiratory: Denies difficulty breathing, shortness of breath, cough or sputum production.   ?Cardiovascular: Denies chest pain, chest tightness, palpitations or swelling in the hands or feet.  ?Gastrointestinal: Denies abdominal pain, bloating, constipation, diarrhea or blood in the stool.  ?GU: Pt reports erectile dysfunction. Denies urgency, frequency, pain with urination, burning sensation, blood in urine, odor or discharge. ?Musculoskeletal: Pt reports intermittent knee pain, joint pain and left thumb. Denies decrease in range of motion, difficulty with gait, muscle pain or joint swelling.  ?Skin: Denies redness, rashes, lesions or ulcercations.  ?Neurological: Denies dizziness, difficulty with memory, difficulty with  speech or problems with balance and coordination.  ?Psych: Denies anxiety, depression, SI/HI. ? ?No other specific complaints in a complete review of systems (except as listed in HPI above). ? ?Objective:  ? Physical Exam ? BP 136/88 (BP Location: Right Arm, Patient Position: Sitting, Cuff Size: Large)   Pulse 73   Temp 98 ?F (36.7 ?C) (Temporal)   Ht '5\' 11"'$  (1.803 m)   Wt 300 lb (136.1 kg)   SpO2 99%   BMI 41.84 kg/m?  ? ? ?Wt Readings from Last 3 Encounters:  ?10/25/21 297 lb (134.7 kg)  ?04/01/21 281 lb (127.5 kg)  ?02/24/21 283 lb (128.4 kg)  ? ? ?General: Appears his stated age, obese, in NAD. ?Skin: Warm, dry and intact.  ?HEENT: Head: normal shape and size; Eyes: sclera white, PERRLA and EOMs intact;  ?Neck:  Neck supple, trachea midline. No masses, lumps or thyromegaly present.  ?Cardiovascular: Normal rate and rhythm. S1,S2 noted.  No murmur, rubs or gallops noted. No JVD or BLE edema. No carotid bruits noted. ?Pulmonary/Chest: Normal effort and positive vesicular breath sounds. No respiratory distress. No wheezes, rales or ronchi noted.  ?Abdomen: Soft and nontender. Normal bowel sounds.  ?Musculoskeletal: Strength 5/5 BUE/BLE.  No difficulty with gait.  ?Neurological: Alert and oriented. Cranial nerves II-XII grossly intact. Coordination normal.  ?Psychiatric: Mood and affect normal. Behavior is normal. Judgment and thought content normal.  ? ? ?BMET ?   ?Component Value Date/Time  ? NA 140 12/30/2020 1600  ? K 4.1 12/30/2020 1600  ? CL 100 12/30/2020 1600  ? CO2 34 (H) 12/30/2020 1600  ? GLUCOSE 130 (H) 12/30/2020 1600  ? BUN 16 12/30/2020 1600  ? CREATININE 1.15 12/30/2020 1600  ? CREATININE 1.22 08/04/2016 1525  ? CALCIUM 9.8 12/30/2020 1600  ? GFRNONAA >60 06/11/2018 1018  ? GFRAA >60 06/11/2018 1018  ? ? ?Lipid Panel  ?   ?Component Value Date/Time  ? CHOL 157 03/26/2020 0849  ? TRIG 99.0 03/26/2020 0849  ? HDL 49.00 03/26/2020 0849  ? CHOLHDL 3 03/26/2020 0849  ? VLDL 19.8 03/26/2020 0849  ?  [place] 89 03/26/2020 0849  ? ? ?CBC ?   ?Component Value Date/Time  ? WBC 7.1 12/30/2020 1600  ? RBC 4.83 12/30/2020 1600  ? HGB 13.0 12/30/2020 1600  ? HGB 14.8 12/07/2017 0847  ? HCT 38.7 (L) 12/30/2020 1600  ? HCT 43.4 12/07/2017 0847  ? PLT 318.0 12/30/2020 1600  ? MCV 80.0 12/30/2020 1600  ? MCH 26.9 06/11/2018 1018  ? MCHC 33.6 12/30/2020 1600  ? RDW 14.4 12/30/2020 1600  ? LYMPHSABS 2.2 12/30/2020 1600  ? MONOABS 0.5 12/30/2020 1600  ? EOSABS 0.3 12/30/2020 1600  ? BASOSABS 0.1 12/30/2020 1600  ? ? ?Hgb A1C ?Lab Results  ?Component Value Date  ? HGBA1C  6.1 12/30/2020  ? ? ? ? ? ? ?   ?Assessment & Plan:  ? ?Preventative Health Maintenance: ? ?He declines flu shots ?He declines tetanus booster today ?Encouraged him to get his covid booster ?Discussed Shingrix vaccine, he will check coverage with his insurance company ?Colon screening UTD ?Encouraged him to consume a balanced diet and exercise regimen ?Advised him to see an eye doctor and dentist annually ?Will check CBC, CMET, Lipid, A1C, HIV and Hep C today ? ?RTC in 6 months, follow up chronic conditions ?Webb Silversmith, NP ?This visit occurred during the SARS-CoV-2 public health emergency.  Safety protocols were in place, including screening questions prior to the visit, additional usage of staff PPE, and extensive cleaning of exam room while observing appropriate contact time as indicated for disinfecting solutions.  ? ? ?

## 2022-02-27 NOTE — Patient Instructions (Signed)

## 2022-02-28 ENCOUNTER — Encounter: Payer: Self-pay | Admitting: Internal Medicine

## 2022-02-28 LAB — HEMOGLOBIN A1C
Hgb A1c MFr Bld: 6.3 % of total Hgb — ABNORMAL HIGH (ref <5.7)
Mean Plasma Glucose: 134 mg/dL
eAG (mmol/L): 7.4 mmol/L

## 2022-02-28 LAB — CBC
HCT: 39.5 % (ref 38.5–50.0)
Hemoglobin: 13.3 g/dL (ref 13.2–17.1)
MCH: 27.5 pg (ref 27.0–33.0)
MCHC: 33.7 g/dL (ref 32.0–36.0)
MCV: 81.8 fL (ref 80.0–100.0)
MPV: 9.1 fL (ref 7.5–12.5)
Platelets: 287 10*3/uL (ref 140–400)
RBC: 4.83 10*6/uL (ref 4.20–5.80)
RDW: 13.6 % (ref 11.0–15.0)
WBC: 6.9 10*3/uL (ref 3.8–10.8)

## 2022-02-28 LAB — COMPLETE METABOLIC PANEL WITH GFR
AG Ratio: 1.6 (calc) (ref 1.0–2.5)
ALT: 28 U/L (ref 9–46)
AST: 22 U/L (ref 10–35)
Albumin: 4.3 g/dL (ref 3.6–5.1)
Alkaline phosphatase (APISO): 78 U/L (ref 35–144)
BUN/Creatinine Ratio: 11 (calc) (ref 6–22)
BUN: 16 mg/dL (ref 7–25)
CO2: 30 mmol/L (ref 20–32)
Calcium: 9.4 mg/dL (ref 8.6–10.3)
Chloride: 103 mmol/L (ref 98–110)
Creat: 1.47 mg/dL — ABNORMAL HIGH (ref 0.70–1.30)
Globulin: 2.7 g/dL (calc) (ref 1.9–3.7)
Glucose, Bld: 103 mg/dL (ref 65–139)
Potassium: 4.3 mmol/L (ref 3.5–5.3)
Sodium: 141 mmol/L (ref 135–146)
Total Bilirubin: 0.4 mg/dL (ref 0.2–1.2)
Total Protein: 7 g/dL (ref 6.1–8.1)
eGFR: 57 mL/min/{1.73_m2} — ABNORMAL LOW (ref >=60)

## 2022-02-28 LAB — LIPID PANEL
Cholesterol: 165 mg/dL (ref <200)
HDL: 38 mg/dL — ABNORMAL LOW (ref >=40)
LDL Cholesterol (Calc): 95 mg/dL (calc)
Non-HDL Cholesterol (Calc): 127 mg/dL (calc) (ref <130)
Total CHOL/HDL Ratio: 4.3 (calc) (ref <5.0)
Triglycerides: 220 mg/dL — ABNORMAL HIGH (ref <150)

## 2022-02-28 LAB — HEPATITIS C ANTIBODY
Hepatitis C Ab: NONREACTIVE
SIGNAL TO CUT-OFF: 0.04 (ref <1.00)

## 2022-02-28 LAB — HIV ANTIBODY (ROUTINE TESTING W REFLEX): HIV 1&2 Ab, 4th Generation: NONREACTIVE

## 2022-02-28 MED ORDER — EZETIMIBE 10 MG PO TABS: 10.0000 mg | ORAL_TABLET | Freq: Every day | ORAL | 1 refills | Status: DC

## 2022-03-09 ENCOUNTER — Other Ambulatory Visit: Payer: Self-pay | Admitting: Cardiology

## 2022-03-09 DIAGNOSIS — I10 Essential (primary) hypertension: Secondary | ICD-10-CM

## 2022-03-09 DIAGNOSIS — R0609 Other forms of dyspnea: Secondary | ICD-10-CM

## 2022-03-09 DIAGNOSIS — Z01810 Encounter for preprocedural cardiovascular examination: Secondary | ICD-10-CM

## 2022-03-13 ENCOUNTER — Other Ambulatory Visit: Payer: Self-pay | Admitting: Internal Medicine

## 2022-03-14 NOTE — Telephone Encounter (Signed)
Requested Prescriptions  ?Pending Prescriptions Disp Refills  ?? nortriptyline (PAMELOR) 50 MG capsule [Pharmacy Med Name: NORTRIPTYLINE HCL 50 MG CAP] 30 capsule 2  ?  Sig: TAKE 1 CAPSULE BY MOUTH AT BEDTIME.  ?  ? Psychiatry:  Antidepressants - Heterocyclics (TCAs) Passed - 03/13/2022 10:09 AM  ?  ?  Passed - Valid encounter within last 6 months  ?  Recent Outpatient Visits   ?      ? 2 weeks ago Encounter for general adult medical examination with abnormal findings  ? [facility] [place], Coralie Keens, NP  ?  ?  ?Future Appointments   ?        ? In 5 months Baity, Coralie Keens, NP [facility] [place], [place]  ?  ? ?  ?  ?  ? ? ?

## 2022-03-27 ENCOUNTER — Encounter: Payer: Self-pay | Admitting: Cardiology

## 2022-03-27 NOTE — Telephone Encounter (Signed)
Can you contact patient please? Thank you.

## 2022-04-11 ENCOUNTER — Ambulatory Visit: Payer: 59 | Admitting: Cardiology

## 2022-04-18 ENCOUNTER — Encounter: Payer: Self-pay | Admitting: Internal Medicine

## 2022-04-18 DIAGNOSIS — R1084 Generalized abdominal pain: Secondary | ICD-10-CM

## 2022-04-20 ENCOUNTER — Encounter: Payer: Self-pay | Admitting: Internal Medicine

## 2022-04-20 ENCOUNTER — Ambulatory Visit (INDEPENDENT_AMBULATORY_CARE_PROVIDER_SITE_OTHER): Payer: 59 | Admitting: Internal Medicine

## 2022-04-20 VITALS — BP 110/86 | HR 69 | Temp 97.4°F | Wt 294.0 lb

## 2022-04-20 DIAGNOSIS — K219 Gastro-esophageal reflux disease without esophagitis: Secondary | ICD-10-CM | POA: Diagnosis not present

## 2022-04-20 DIAGNOSIS — R1084 Generalized abdominal pain: Secondary | ICD-10-CM

## 2022-04-20 NOTE — Progress Notes (Signed)
? ?Subjective:  ? ? Patient ID: Kenneth Sherman, male    DOB: 04-07-1971, 51 y.o.   MRN: [id number] ? ?HPI ? ?Patient presents to clinic today with complaint of abdominal pain and poor appetite.  He noticed this 1 week ago.  He describes the abdominal pain as intermittent and sharp sharp.  The pain does seem worse after he eats.  He reports his abdomen is sensitive to touch.  He has a history of reflux but takes Omeprazole as prescribed.  He denies nausea, vomiting, constipation, diarrhea or blood in the stool.  He denies urinary or prostate problems.  He denies fever, chills or body aches.  He has not taken anything OTC for this. ? ?Review of Systems ? ?   ?Past Medical History:  ?Diagnosis Date  ? Arthritis   ? Coronary atherosclerosis due to calcified coronary lesion   ? GERD (gastroesophageal reflux disease)   ? Heartburn   ? HLD (hyperlipidemia)   ? Hypertension   ? no meds  ? Hypogonadism in male   ? Right knee DJD   ? Thyroid disease   ? ? ?Current Outpatient Medications  ?Medication Sig Dispense Refill  ? aspirin 81 MG EC tablet Take 1 tablet (81 mg total) by mouth daily. Swallow whole. 30 tablet 12  ? atorvastatin (LIPITOR) 10 MG tablet Take 1 tablet (10 mg total) by mouth daily. 90 tablet 1  ? diclofenac (VOLTAREN) 50 MG EC tablet Take 50 mg by mouth 2 (two) times daily as needed.    ? ezetimibe (ZETIA) 10 MG tablet Take 1 tablet (10 mg total) by mouth daily. 90 tablet 1  ? lisinopril-hydrochlorothiazide (ZESTORETIC) 10-12.5 MG tablet TAKE 1 TABLET BY MOUTH EVERY DAY 90 tablet 3  ? metoprolol succinate (TOPROL-XL) 25 MG 24 hr tablet TAKE 1 TABLET (25 MG TOTAL) BY MOUTH IN THE MORNING. 90 tablet 2  ? nortriptyline (PAMELOR) 50 MG capsule TAKE 1 CAPSULE BY MOUTH AT BEDTIME. 90 capsule 1  ? omeprazole (PRILOSEC) 20 MG capsule TAKE 1 CAPSULE BY MOUTH EVERY DAY 90 capsule 1  ? sildenafil (REVATIO) 20 MG tablet TAKE 2-5 TABS AS NEEDED FOR SEXUAL INTERCOURSE *MAX PER INSURANCE* 15 tablet 3  ? ?No current  facility-administered medications for this visit.  ? ? ?No Known Allergies ? ?Family History  ?Problem Relation Age of Onset  ? Heart attack Mother   ? Hypertension Mother   ? Hypertension Father   ? Heart attack Father   ? Hypertension Brother   ? Healthy Brother   ? Healthy Brother   ? Healthy Brother   ? Kidney disease Neg Hx   ? Prostate cancer Neg Hx   ? Bladder Cancer Neg Hx   ? Colon cancer Neg Hx   ? ? ?Social History  ? ?Socioeconomic History  ? Marital status: Married  ?  Spouse name: Not on file  ? Number of children: Not on file  ? Years of education: 43  ? Highest education level: Not on file  ?Occupational History  ? Occupation: CAD Designer  ?  Employer: [employer]  ?Tobacco Use  ? Smoking status: Never  ? Smokeless tobacco: Never  ?Vaping Use  ? Vaping Use: Never used  ?Substance and Sexual Activity  ? Alcohol use: Yes  ?  Alcohol/week: 2.0 standard drinks  ?  Types: 1 Glasses of wine, 1 Cans of beer per week  ?  Comment: occasionally  ? Drug use: No  ? Sexual activity: Yes  ?  Birth control/protection: Condom  ?Other Topics Concern  ? Not on file  ?Social History Narrative  ? Regular exercise-no  ? Caffeine Use-yes  ? ?Social Determinants of Health  ? ?Financial Resource Strain: Not on file  ?Food Insecurity: Not on file  ?Transportation Needs: Not on file  ?Physical Activity: Not on file  ?Stress: Not on file  ?Social Connections: Not on file  ?Intimate Partner Violence: Not on file  ? ? ? ?Constitutional: Denies fever, malaise, fatigue, headache or abrupt weight changes.  ?Respiratory: Denies difficulty breathing, shortness of breath, cough or sputum production.   ?Cardiovascular: Denies chest pain, chest tightness, palpitations or swelling in the hands or feet.  ?Gastrointestinal: Patient reports abdominal pain and lack of appetite.  Denies bloating, constipation, diarrhea or blood in the stool.  ?GU: Denies urgency, frequency, pain with urination, burning sensation, blood in urine, odor or  discharge. ?Musculoskeletal: Denies decrease in range of motion, difficulty with gait, muscle pain or joint pain and swelling.  ?Skin: Denies redness, rashes, lesions or ulcercations.  ? ?No other specific complaints in a complete review of systems (except as listed in HPI above). ? ?Objective:  ? Physical Exam ?BP 110/86 (BP Location: Left Arm, Patient Position: Sitting, Cuff Size: Large)   Pulse 69   Temp (!) 97.4 ?F (36.3 ?C) (Temporal)   Wt 294 lb (133.4 kg)   SpO2 100%   BMI 41.00 kg/m?  ? ?Wt Readings from Last 3 Encounters:  ?02/27/22 300 lb (136.1 kg)  ?10/25/21 297 lb (134.7 kg)  ?04/01/21 281 lb (127.5 kg)  ? ? ?General: Appears his stated age, obese, in NAD. ?Skin: Warm, dry and intact. No rashes noted. ?Cardiovascular: Normal rate and rhythm. S1,S2 noted.  No murmur, rubs or gallops noted. ?Pulmonary/Chest: Normal effort and positive vesicular breath sounds. No respiratory distress. No wheezes, rales or ronchi noted.  ?Abdomen: Soft and tender in the LUQ, RUQ and R LQ.Marland Kitchen Normal bowel sounds. No distention or masses noted. Liver, spleen and kidneys non palpable. ?Musculoskeletal: No difficulty with gait.  ?Neurological: Alert and oriented.  ? ?BMET ?   ?Component Value Date/Time  ? NA 141 02/27/2022 1343  ? K 4.3 02/27/2022 1343  ? CL 103 02/27/2022 1343  ? CO2 30 02/27/2022 1343  ? GLUCOSE 103 02/27/2022 1343  ? BUN 16 02/27/2022 1343  ? CREATININE 1.47 (H) 02/27/2022 1343  ? CALCIUM 9.4 02/27/2022 1343  ? GFRNONAA >60 06/11/2018 1018  ? GFRAA >60 06/11/2018 1018  ? ? ?Lipid Panel  ?   ?Component Value Date/Time  ? CHOL 165 02/27/2022 1343  ? TRIG 220 (H) 02/27/2022 1343  ? HDL 38 (L) 02/27/2022 1343  ? CHOLHDL 4.3 02/27/2022 1343  ? VLDL 19.8 03/26/2020 0849  ? [place] 95 02/27/2022 1343  ? ? ?CBC ?   ?Component Value Date/Time  ? WBC 6.9 02/27/2022 1343  ? RBC 4.83 02/27/2022 1343  ? HGB 13.3 02/27/2022 1343  ? HGB 14.8 12/07/2017 0847  ? HCT 39.5 02/27/2022 1343  ? HCT 43.4 12/07/2017 0847  ? PLT  287 02/27/2022 1343  ? MCV 81.8 02/27/2022 1343  ? MCH 27.5 02/27/2022 1343  ? MCHC 33.7 02/27/2022 1343  ? RDW 13.6 02/27/2022 1343  ? LYMPHSABS 2.2 12/30/2020 1600  ? MONOABS 0.5 12/30/2020 1600  ? EOSABS 0.3 12/30/2020 1600  ? BASOSABS 0.1 12/30/2020 1600  ? ? ?Hgb A1C ?Lab Results  ?Component Value Date  ? HGBA1C 6.3 (H) 02/27/2022  ? ? ? ? ? ? ?   ?  Assessment & Plan:  ? ?Generalized Abdominal Pain, GERD: ? ?We will check CBC, c-Met, amylase, lipase ?Increase omeprazole to 40 mg daily ?Offered Toradol shot, he declined at this time ?Consider CT abdomen/pelvis if pain persist or worsens ? ?We will follow-up after labs with further recommendation and treatment plan ? ?Webb Silversmith, NP ? ?

## 2022-04-20 NOTE — Patient Instructions (Signed)
Abdominal Pain, Adult Many things can cause belly (abdominal) pain. Most times, belly pain is not dangerous. Many cases of belly pain can be watched and treated at home. Sometimes, though, belly pain is serious. Your doctor will try to find the cause of your belly pain. Follow these instructions at home:  Medicines Take over-the-counter and prescription medicines only as told by your doctor. Do not take medicines that help you poop (laxatives) unless told by your doctor. General instructions Watch your belly pain for any changes. Drink enough fluid to keep your pee (urine) pale yellow. Keep all follow-up visits as told by your doctor. This is important. Contact a doctor if: Your belly pain changes or gets worse. You are not hungry, or you lose weight without trying. You are having trouble pooping (constipated) or have watery poop (diarrhea) for more than 2-3 days. You have pain when you pee or poop. Your belly pain wakes you up at night. Your pain gets worse with meals, after eating, or with certain foods. You are vomiting and cannot keep anything down. You have a fever. You have blood in your pee. Get help right away if: Your pain does not go away as soon as your doctor says it should. You cannot stop vomiting. Your pain is only in areas of your belly, such as the right side or the left lower part of the belly. You have bloody or black poop, or poop that looks like tar. You have very bad pain, cramping, or bloating in your belly. You have signs of not having enough fluid or water in your body (dehydration), such as: Dark pee, very little pee, or no pee. Cracked lips. Dry mouth. Sunken eyes. Sleepiness. Weakness. You have trouble breathing or chest pain. Summary Many cases of belly pain can be watched and treated at home. Watch your belly pain for any changes. Take over-the-counter and prescription medicines only as told by your doctor. Contact a doctor if your belly pain  changes or gets worse. Get help right away if you have very bad pain, cramping, or bloating in your belly. This information is not intended to replace advice given to you by your health care provider. Make sure you discuss any questions you have with your health care provider. Document Revised: 04/07/2019 Document Reviewed: 04/07/2019 Elsevier Patient Education  2023 [employer]  

## 2022-04-21 ENCOUNTER — Ambulatory Visit: Payer: 59 | Admitting: Cardiology

## 2022-04-21 LAB — COMPLETE METABOLIC PANEL WITH GFR
AG Ratio: 1.6 (calc) (ref 1.0–2.5)
ALT: 27 U/L (ref 9–46)
AST: 22 U/L (ref 10–35)
Albumin: 4.4 g/dL (ref 3.6–5.1)
Alkaline phosphatase (APISO): 90 U/L (ref 35–144)
BUN: 15 mg/dL (ref 7–25)
CO2: 31 mmol/L (ref 20–32)
Calcium: 9.4 mg/dL (ref 8.6–10.3)
Chloride: 99 mmol/L (ref 98–110)
Creat: 1.23 mg/dL (ref 0.70–1.30)
Globulin: 2.7 g/dL (calc) (ref 1.9–3.7)
Glucose, Bld: 110 mg/dL — ABNORMAL HIGH (ref 65–99)
Potassium: 4.8 mmol/L (ref 3.5–5.3)
Sodium: 138 mmol/L (ref 135–146)
Total Bilirubin: 0.6 mg/dL (ref 0.2–1.2)
Total Protein: 7.1 g/dL (ref 6.1–8.1)
eGFR: 71 mL/min/{1.73_m2} (ref >=60)

## 2022-04-21 LAB — CBC
HCT: 41.8 % (ref 38.5–50.0)
Hemoglobin: 13.8 g/dL (ref 13.2–17.1)
MCH: 27.8 pg (ref 27.0–33.0)
MCHC: 33 g/dL (ref 32.0–36.0)
MCV: 84.3 fL (ref 80.0–100.0)
MPV: 8.7 fL (ref 7.5–12.5)
Platelets: 296 10*3/uL (ref 140–400)
RBC: 4.96 10*6/uL (ref 4.20–5.80)
RDW: 14 % (ref 11.0–15.0)
WBC: 7.1 10*3/uL (ref 3.8–10.8)

## 2022-04-21 LAB — AMYLASE: Amylase: 65 U/L (ref 21–101)

## 2022-04-21 LAB — LIPASE: Lipase: 33 U/L (ref 7–60)

## 2022-04-21 NOTE — Progress Notes (Deleted)
Date:  04/21/2022   ID:  Kenneth Sherman, DOB 1971/04/14, MRN [id number]  PCP:  Jearld Fenton, NP  Cardiologist:  Alethia Berthold, DO, FACC (established care 09/09/2020)  Date: 04/21/22 Last Office Visit: 09/27/2020   No chief complaint on file.   HPI  Kenneth Sherman is a 51 y.o. male who presents to the office with a chief complaint of " 1 year follow up for History of coronary artery calcification." Patient's past medical history and cardiovascular risk factors include: Family history of premature coronary artery disease, coronary artery calcification, hypertension, hyperlipidemia, obesity.   He is referred to the office at the request of Jearld Fenton, NP.   Patient was originally referred to the office for preoperative risk stratification given his family history of premature CAD.  Patient underwent cardiac evaluation in 09/2020 with echocardiogram revealing preserved LVEF and mildly dilated LA, otherwise unremarkable.  Stress test was overall low risk.  Coronary calcium score placed patient in the 50th-75th percentile based on age and sex.  She was last seen in our office 10/25/2021 with complaints of chest pain which appeared to be noncardiac.  He was advised to follow-up in 3 months, however he was lost to follow-up until now.  Patient now presents for regular follow-up.  Personally reviewed external labs.  Most recent A1c 6.3% (prediabetes), and lipid profile testing Reveals triglycerides 220 with LDL of 95.***  ***  for evaluation of preoperative stratification prior to upcoming noncardiac surgery.   Work-up included coronary calcium score, echocardiogram, and stress test.  Patient was deemed to be appropriate candidate for knee replacement.  Since patient underwent bilateral knee replacements in November 2021 followed by February 2022.  Over the last 1 year patient is doing well from a cardiovascular standpoint.  He states randomly he does experience chest discomfort, occurs  once couple weeks, left-sided, at times reproducible with palpation, nonexertional, lasting for 15 to 20 minutes, and self-limited.  He denies any shortness of breath at rest or with effort related activities.  Patient is aware that he has gained weight since last office visit.  Family history of premature coronary disease (mom passed at the age of 75 and dad passed away at the age of 60 due to heart disease).   FUNCTIONAL STATUS: No structured exercise program or daily routine. His is able to get in 6,000 steps per day.    ALLERGIES: No Known Allergies  MEDICATION LIST PRIOR TO VISIT: No outpatient medications have been marked as taking for the 04/21/22 encounter (Appointment) with Terri Skains, Sunit, DO.     PAST MEDICAL HISTORY: Past Medical History:  Diagnosis Date   Arthritis    Coronary atherosclerosis due to calcified coronary lesion    GERD (gastroesophageal reflux disease)    Heartburn    HLD (hyperlipidemia)    Hypertension    no meds   Hypogonadism in male    Right knee DJD    Thyroid disease     PAST SURGICAL HISTORY: Past Surgical History:  Procedure Laterality Date   BICEPS TENDON REPAIR  05/2015   COLONOSCOPY WITH PROPOFOL N/A 04/01/2021   Procedure: COLONOSCOPY WITH PROPOFOL;  Surgeon: Virgel Manifold, MD;  Location: [facility];  Service: Endoscopy;  Laterality: N/A;   KNEE ARTHROSCOPY W/ MENISCECTOMY  2008   left   KNEE ARTHROSCOPY W/ MENISCECTOMY  2011   right   PARTIAL KNEE ARTHROPLASTY  11/29/2012   Procedure: UNICOMPARTMENTAL KNEE;  Surgeon: Lorn Junes, MD;  Location: [facility];  Service: Orthopedics;  Laterality: Right;    FAMILY HISTORY: The patient family history includes Healthy in his brother, brother, and brother; Heart attack in his father and mother; Hypertension in his brother, father, and mother.  SOCIAL HISTORY:  The patient  reports that he has never smoked. He has never used smokeless tobacco. He reports current alcohol use of  about 2.0 standard drinks per week. He reports that he does not use drugs.  REVIEW OF SYSTEMS: Review of Systems  Constitutional: Negative for chills and fever.  HENT:  Negative for hoarse voice and nosebleeds.   Eyes:  Negative for discharge, double vision and pain.  Cardiovascular:  Negative for chest pain, claudication, dyspnea on exertion, leg swelling, near-syncope, orthopnea, palpitations, paroxysmal nocturnal dyspnea and syncope.  Respiratory:  Negative for hemoptysis and shortness of breath.   Musculoskeletal:  Negative for muscle cramps and myalgias.  Gastrointestinal:  Negative for abdominal pain, constipation, diarrhea, hematemesis, hematochezia, melena, nausea and vomiting.  Neurological:  Negative for dizziness and light-headedness.   PHYSICAL EXAM:    04/20/2022    8:31 AM 02/27/2022    1:24 PM 10/25/2021    2:30 PM  Vitals with BMI  Height  '5\' 11"'$  '5\' 11"'$   Weight 294 lbs 300 lbs 297 lbs  BMI  67.89 38.10  Systolic 175 102 585  Diastolic 86 88 83  Pulse 69 73 90   *** CONSTITUTIONAL: Well-developed and well-nourished. No acute distress.  SKIN: Skin is warm and dry. No rash noted. No cyanosis. No pallor. No jaundice HEAD: Normocephalic and atraumatic.  EYES: No scleral icterus MOUTH/THROAT: Moist oral membranes.  NECK: No JVD present. No thyromegaly noted. No carotid bruits  LYMPHATIC: No visible cervical adenopathy.  CHEST Normal respiratory effort. No intercostal retractions  LUNGS: Clear to auscultation bilaterally. No stridor. No wheezes. No rales.  CARDIOVASCULAR: Regular rate and rhythm, positive S1-S2, no murmurs rubs or gallops appreciated. ABDOMINAL: Obese, soft, nontender, nondistended, positive bowel sounds all 4 quadrants.No apparent ascites.  EXTREMITIES: No peripheral edema  HEMATOLOGIC: No significant bruising NEUROLOGIC: Oriented to person, place, and time. Nonfocal. Normal muscle tone.  PSYCHIATRIC: Normal mood and affect. Normal behavior.  Cooperative  CARDIAC DATABASE: EKG: 10/25/2021: NSR, 88 bpm, poor R wave progression, without underlying ischemia or injury pattern.   ***  Echocardiogram: 09/16/2020: Left ventricle cavity is normal in size and wall thickness. Normal global wall motion. Normal LV systolic function with visual EF 50-55%. Normal diastolic filling pattern. Left atrial cavity is mildly dilated. Mild tricuspid regurgitation. No evidence of pulmonary hypertension.  Stress Testing: Lexiscan (Walking with Jaci Carrel) Sestamibi Stress Test 09/15/2020: Nondiagnostic ECG stress due to Lexiscan stress.  Diaphragmatic attenuation and apical thinning noted without ischemia.  Gated SPECT imaging of the left ventricle was normal. All segments of left ventricle demonstrated normal wall motion and thickening. No stress lung uptake. TID is normal. Stress LV EF is normal 57%.  No previous exam available for comparison. Low risk.   Coronary artery calcification scoring performed on 09/24/2020 at [facility]: Left main: 0 Left anterior descending: 11 Left circumflex: 1 Right coronary artery: 8 Total calcium score: 20 AU 50-75th percentile.  Noncardiac findings: Small sliding hiatal hernia.  Heart Catheterization: None  LABORATORY DATA:    Latest Ref Rng & Units 04/20/2022    8:50 AM 02/27/2022    1:43 PM 12/30/2020    4:00 PM  CBC  WBC 3.8 - 10.8 Thousand/uL 7.1   6.9   7.1    Hemoglobin 13.2 -  17.1 g/dL 13.8   13.3   13.0    Hematocrit 38.5 - 50.0 % 41.8   39.5   38.7    Platelets 140 - 400 Thousand/uL 296   287   318.0         Latest Ref Rng & Units 04/20/2022    8:50 AM 02/27/2022    1:43 PM 12/30/2020    4:00 PM  CMP  Glucose 65 - 99 mg/dL 110   103   130    BUN 7 - 25 mg/dL '15   16   16    '$ Creatinine 0.70 - 1.30 mg/dL 1.23   1.47   1.15    Sodium 135 - 146 mmol/L 138   141   140    Potassium 3.5 - 5.3 mmol/L 4.8   4.3   4.1    Chloride 98 - 110 mmol/L 99   103   100    CO2 20 - 32 mmol/L 31   30    34    Calcium 8.6 - 10.3 mg/dL 9.4   9.4   9.8    Total Protein 6.1 - 8.1 g/dL 7.1   7.0   7.1    Total Bilirubin 0.2 - 1.2 mg/dL 0.6   0.4   0.4    Alkaline Phos 39 - 117 U/L   108    AST 10 - 35 U/L '22   22   18    '$ ALT 9 - 46 U/L '27   28   27      '$ Lipid Panel     Component Value Date/Time   CHOL 165 02/27/2022 1343   TRIG 220 (H) 02/27/2022 1343   HDL 38 (L) 02/27/2022 1343   CHOLHDL 4.3 02/27/2022 1343   VLDL 19.8 03/26/2020 0849   LDLCALC 95 02/27/2022 1343   LDLDIRECT 157.0 06/04/2018 1543    No components found for: NTPROBNP No results for input(s): PROBNP in the last 8760 hours. No results for input(s): TSH in the last 8760 hours.   BMP Recent Labs    02/27/22 1343 04/20/22 0850  NA 141 138  K 4.3 4.8  CL 103 99  CO2 30 31  GLUCOSE 103 110*  BUN 16 15  CREATININE 1.47* 1.23  CALCIUM 9.4 9.4     HEMOGLOBIN A1C Lab Results  Component Value Date   HGBA1C 6.3 (H) 02/27/2022   MPG 134 02/27/2022    IMPRESSION:  No diagnosis found.    RECOMMENDATIONS: Kenneth Sherman is a 51 y.o. male whose past medical history and cardiac risk factors include: Family history of premature coronary artery disease, hypertension, hyperlipidemia, obesity, hyperglycemia.   ***  ***  Patient had establish care with the practice as part of his preoperative risk stratification for his upcoming orthopedic surgery.  He now presents for 1 year follow-up.  He is doing well from a cardiovascular standpoint.  He has atypical precordial discomfort which appears to be noncardiac in etiology. EKG shows normal sinus rhythm without underlying injury pattern. Reviewed the most recent coronary calcium report, echocardiogram, and stress test.  Given his symptoms and recent ischemic evaluation would not recommend repeat stress test at this time.  We did discuss considering coronary CTA as an alternative to stress testing.  However, the shared decision was to hold off on additional testing at  this time.  However, if his symptoms were to increase in intensity, frequency, and/or duration he is asked to seek medical attention by going  to the closest ER via EMS for further evaluation.    From a cardiovascular standpoint emphasized the importance of improving his modifiable cardiovascular risk factors given his family history of premature CAD and his risk factors.  Patient is motivated with regards to starting to increase his physical activity as tolerated and consuming a heart healthy diet.  Recommend follow-up in 3 months or sooner if change in clinical status.  FINAL MEDICATION LIST END OF ENCOUNTER: No orders of the defined types were placed in this encounter.   There are no discontinued medications.    Current Outpatient Medications:    aspirin 81 MG EC tablet, Take 1 tablet (81 mg total) by mouth daily. Swallow whole., Disp: 30 tablet, Rfl: 12   atorvastatin (LIPITOR) 10 MG tablet, Take 1 tablet (10 mg total) by mouth daily., Disp: 90 tablet, Rfl: 1   diclofenac (VOLTAREN) 50 MG EC tablet, Take 50 mg by mouth 2 (two) times daily as needed., Disp: , Rfl:    ezetimibe (ZETIA) 10 MG tablet, Take 1 tablet (10 mg total) by mouth daily., Disp: 90 tablet, Rfl: 1   lisinopril-hydrochlorothiazide (ZESTORETIC) 10-12.5 MG tablet, TAKE 1 TABLET BY MOUTH EVERY DAY, Disp: 90 tablet, Rfl: 3   metoprolol succinate (TOPROL-XL) 25 MG 24 hr tablet, TAKE 1 TABLET (25 MG TOTAL) BY MOUTH IN THE MORNING., Disp: 90 tablet, Rfl: 2   nortriptyline (PAMELOR) 50 MG capsule, TAKE 1 CAPSULE BY MOUTH AT BEDTIME., Disp: 90 capsule, Rfl: 1   omeprazole (PRILOSEC) 20 MG capsule, TAKE 1 CAPSULE BY MOUTH EVERY DAY, Disp: 90 capsule, Rfl: 1   sildenafil (REVATIO) 20 MG tablet, TAKE 2-5 TABS AS NEEDED FOR SEXUAL INTERCOURSE *MAX PER INSURANCE*, Disp: 15 tablet, Rfl: 3  No orders of the defined types were placed in this encounter.   There are no Patient Instructions on file for this visit.   --Continue cardiac  medications as reconciled in final medication list. --No follow-ups on file. Or sooner if needed. --Continue follow-up with your primary care physician regarding the management of your other chronic comorbid conditions.  Patient's questions and concerns were addressed to his satisfaction. He voices understanding of the instructions provided during this encounter.   This note was created using a voice recognition software as a result there may be grammatical errors inadvertently enclosed that do not reflect the nature of this encounter. Every attempt is made to correct such errors.   Alethia Berthold, PA-C 04/21/2022, 10:23 AM Office: [phone]

## 2022-04-24 ENCOUNTER — Other Ambulatory Visit: Payer: Self-pay | Admitting: Internal Medicine

## 2022-04-24 MED ORDER — SUCRALFATE 1 G PO TABS: 1.0000 g | ORAL_TABLET | Freq: Three times a day (TID) | ORAL | 0 refills | Status: DC

## 2022-04-25 NOTE — Telephone Encounter (Signed)
Requested Prescriptions  ?Pending Prescriptions Disp Refills  ?? atorvastatin (LIPITOR) 10 MG tablet [Pharmacy Med Name: ATORVASTATIN 10 MG TABLET] 90 tablet 1  ?  Sig: TAKE 1 TABLET BY MOUTH EVERY DAY  ?  ? Cardiovascular:  Antilipid - Statins Failed - 04/24/2022  3:22 PM  ?  ?  Failed - Lipid Panel in normal range within the last 12 months  ?  Cholesterol  ?Date Value Ref Range Status  ?02/27/2022 165 <200 mg/dL Final  ? ?LDL Cholesterol (Calc)  ?Date Value Ref Range Status  ?02/27/2022 95 mg/dL (calc) Final  ?  Comment:  ?  Reference range: <100 ?Marland Kitchen ?Desirable range <100 mg/dL for primary prevention;   ?<70 mg/dL for patients with CHD or diabetic patients  ?with > or = 2 CHD risk factors. ?. ?LDL-C is now calculated using the Martin-Hopkins  ?calculation, which is a validated novel method providing  ?better accuracy than the Friedewald equation in the  ?estimation of LDL-C.  ?Cresenciano Genre et al. Annamaria Helling. 1601;093(23): 2061-2068  ?(http://education.[employer].com/faq/FAQ164) ?  ? ?Direct LDL  ?Date Value Ref Range Status  ?06/04/2018 157.0 mg/dL Final  ?  Comment:  ?  Optimal:  <100 mg/dLNear or Above Optimal:  100-129 mg/dLBorderline High:  130-159 mg/dLHigh:  160-189 mg/dLVery High:  >190 mg/dL  ? ?HDL  ?Date Value Ref Range Status  ?02/27/2022 38 (L) > OR = 40 mg/dL Final  ? ?Triglycerides  ?Date Value Ref Range Status  ?02/27/2022 220 (H) <150 mg/dL Final  ?  Comment:  ?  . ?If a non-fasting specimen was collected, consider ?repeat triglyceride testing on a fasting specimen ?if clinically indicated.  ?Garald Balding al. J. of Clin. Lipidol. 5573;2:202-542. ?. ?  ? ?  ?  ?  Passed - Patient is not pregnant  ?  ?  Passed - Valid encounter within last 12 months  ?  Recent Outpatient Visits   ?      ? 5 days ago Generalized abdominal pain  ? [facility], Coralie Keens, NP  ? 1 month ago Encounter for general adult medical examination with abnormal findings  ? [facility], Coralie Keens, NP  ?  ?  ?Future Appointments   ?        ? In 4 months Baity, Coralie Keens, NP [facility], [place]  ?  ? ?  ?  ?  ? ? ?

## 2022-04-26 ENCOUNTER — Other Ambulatory Visit: Payer: Self-pay | Admitting: Internal Medicine

## 2022-04-26 DIAGNOSIS — Z0000 Encounter for general adult medical examination without abnormal findings: Secondary | ICD-10-CM

## 2022-04-26 NOTE — Telephone Encounter (Signed)
Requested medication (s) are due for refill today: yes ? ?Requested medication (s) are on the active medication list: yes ? ?Last refill:  03/22/21 #90/3 ? ?Future visit scheduled: yes ? ?Notes to clinic:  Unable to refill per protocol, medication not assigned to the refill protocol. ? ?  ?Requested Prescriptions  ?Pending Prescriptions Disp Refills  ? lisinopril-hydrochlorothiazide (ZESTORETIC) 10-12.5 MG tablet [Pharmacy Med Name: LISINOPRIL-HCTZ 10-12.5 MG TAB] 90 tablet 3  ?  Sig: TAKE 1 TABLET BY MOUTH EVERY DAY  ?  ? There is no refill protocol information for this order  ?  ? ?

## 2022-05-09 ENCOUNTER — Other Ambulatory Visit: Payer: Self-pay | Admitting: Internal Medicine

## 2022-05-09 DIAGNOSIS — R1013 Epigastric pain: Secondary | ICD-10-CM

## 2022-05-10 ENCOUNTER — Other Ambulatory Visit: Payer: 59

## 2022-05-10 DIAGNOSIS — R1013 Epigastric pain: Secondary | ICD-10-CM

## 2022-05-11 LAB — H. PYLORI BREATH TEST: H. pylori Breath Test: NOT DETECTED

## 2022-05-12 NOTE — Addendum Note (Signed)
Addended by: Jearld Fenton on: 05/12/2022 11:35 AM   Modules accepted: Orders

## 2022-05-16 ENCOUNTER — Ambulatory Visit
Admission: RE | Admit: 2022-05-16 | Discharge: 2022-05-16 | Disposition: A | Discharge status: Home / Self Care | Payer: 59 | Source: Ambulatory Visit | Attending: Internal Medicine | Admitting: Internal Medicine

## 2022-05-16 DIAGNOSIS — R1084 Generalized abdominal pain: Secondary | ICD-10-CM | POA: Insufficient documentation

## 2022-05-16 MED ORDER — IOHEXOL 300 MG/ML  SOLN
100.0000 mL | Freq: Once | INTRAMUSCULAR | Status: AC | PRN
  Administered 2022-05-16: 100 mL via INTRAVENOUS

## 2022-05-16 NOTE — Addendum Note (Signed)
Addended by: Jearld Fenton on: 05/16/2022 01:17 PM   Modules accepted: Orders

## 2022-05-17 ENCOUNTER — Encounter: Payer: Self-pay | Admitting: Gastroenterology

## 2022-05-17 ENCOUNTER — Ambulatory Visit (INDEPENDENT_AMBULATORY_CARE_PROVIDER_SITE_OTHER): Payer: 59 | Admitting: Gastroenterology

## 2022-05-17 ENCOUNTER — Other Ambulatory Visit: Payer: Self-pay

## 2022-05-17 VITALS — BP 132/86 | HR 67 | Temp 98.3°F | Ht 71.0 in | Wt 290.2 lb

## 2022-05-17 DIAGNOSIS — R109 Unspecified abdominal pain: Secondary | ICD-10-CM

## 2022-05-17 DIAGNOSIS — R1011 Right upper quadrant pain: Secondary | ICD-10-CM | POA: Diagnosis not present

## 2022-05-17 NOTE — Progress Notes (Signed)
Cephas Darby, MD [address]  [place]  [place], [place] [address]  Main: [phone]  Fax: [phone]    Gastroenterology Consultation  Referring Provider:     Jearld Fenton, NP Primary Care Physician:  Jearld Fenton, NP Primary Gastroenterologist:  Dr. Cephas Darby Reason for Consultation:   Right upper quadrant pain        HPI:   Kenneth Sherman is a 51 y.o. male referred by Dr. Jearld Fenton, NP  for consultation & management of right upper quadrant pain.  Patient reports that for last 1 month, he has been experiencing right-sided upper abdominal pain associated with decreased appetite.  Pain is worse postprandial and his right upper quadrant is sensitive to touch.  He describes pain as sharp, intermittent that comes and goes, lasts throughout the day.  Laying on his back relieves the pain somewhat.  He has been taking omeprazole 20 mg daily and sucralfate has been added recently by his PCP when he had a visit on 04/20/2022.  Patient has severe arthritis of his bilateral knees, underwent knee replacement, pain is persistent, has been taking diclofenac 50 to 75 mg daily along with aspirin 81 mg daily for a long time.  Patient denies any heartburn, epigastric discomfort, melena.  Patient does not smoke or drink alcohol.  Patient underwent CT abdomen and pelvis with contrast yesterday due to persistent pain, found to have hepatic steatosis, 1 cm exophytic increased density in the mid left kidney consistent with cyst which is seen on previous MRI, small hiatal hernia.  No evidence of cholelithiasis.  H. pylori breath test was negative, CBC, CMP, serum amylase, lipase were normal.  Patient is frustrated with ongoing abdominal pain that is limiting his p.o. intake, has lost few pounds  NSAIDs: Diclofenac and aspirin 81 mg daily first few years  Antiplts/Anticoagulants/Anti thrombotics: None  GI Procedures:  Screening colonoscopy 04/01/2021 - One 6 mm polyp in the sigmoid  colon, removed with a cold snare. Resected and retrieved. - The examination was otherwise normal. - The rectum, sigmoid colon, descending colon, transverse colon, ascending colon and cecum are normal. - Non-bleeding internal hemorrhoids. DIAGNOSIS:  A.  COLON POLYP, SIGMOID; COLD SNARE:  - HYPERPLASTIC POLYP.  - NEGATIVE FOR DYSPLASIA AND MALIGNANCY.  Past Medical History:  Diagnosis Date   Arthritis    Coronary atherosclerosis due to calcified coronary lesion    GERD (gastroesophageal reflux disease)    Heartburn    HLD (hyperlipidemia)    Hypertension    no meds   Hypogonadism in male    Right knee DJD    Thyroid disease     Past Surgical History:  Procedure Laterality Date   BICEPS TENDON REPAIR  05/2015   COLONOSCOPY WITH PROPOFOL N/A 04/01/2021   Procedure: COLONOSCOPY WITH PROPOFOL;  Surgeon: Virgel Manifold, MD;  Location: [facility];  Service: Endoscopy;  Laterality: N/A;   KNEE ARTHROSCOPY W/ MENISCECTOMY  2008   left   KNEE ARTHROSCOPY W/ MENISCECTOMY  2011   right   PARTIAL KNEE ARTHROPLASTY  11/29/2012   Procedure: UNICOMPARTMENTAL KNEE;  Surgeon: Lorn Junes, MD;  Location: [place] [facility];  Service: Orthopedics;  Laterality: Right;    Current Outpatient Medications:    aspirin 81 MG EC tablet, Take 1 tablet (81 mg total) by mouth daily. Swallow whole., Disp: 30 tablet, Rfl: 12   atorvastatin (LIPITOR) 10 MG tablet, TAKE 1 TABLET BY MOUTH EVERY DAY, Disp: 90 tablet, Rfl: 1  diclofenac (VOLTAREN) 75 MG EC tablet, Take 75 mg by mouth 2 (two) times daily., Disp: , Rfl:    ezetimibe (ZETIA) 10 MG tablet, Take 1 tablet (10 mg total) by mouth daily., Disp: 90 tablet, Rfl: 1   lisinopril-hydrochlorothiazide (ZESTORETIC) 10-12.5 MG tablet, TAKE 1 TABLET BY MOUTH EVERY DAY, Disp: 90 tablet, Rfl: 1   metoprolol succinate (TOPROL-XL) 25 MG 24 hr tablet, TAKE 1 TABLET (25 MG TOTAL) BY MOUTH IN THE MORNING., Disp: 90 tablet, Rfl: 2   nortriptyline (PAMELOR) 50 MG  capsule, TAKE 1 CAPSULE BY MOUTH AT BEDTIME., Disp: 90 capsule, Rfl: 1   omeprazole (PRILOSEC) 20 MG capsule, TAKE 1 CAPSULE BY MOUTH EVERY DAY, Disp: 90 capsule, Rfl: 1   sildenafil (REVATIO) 20 MG tablet, TAKE 2-5 TABS AS NEEDED FOR SEXUAL INTERCOURSE *MAX PER INSURANCE*, Disp: 15 tablet, Rfl: 3   sucralfate (CARAFATE) 1 g tablet, Take 1 tablet (1 g total) by mouth 4 (four) times daily -  with meals and at bedtime., Disp: 30 tablet, Rfl: 0   Family History  Problem Relation Age of Onset   Heart attack Mother    Hypertension Mother    Hypertension Father    Heart attack Father    Hypertension Brother    Healthy Brother    Healthy Brother    Healthy Brother    Kidney disease Neg Hx    Prostate cancer Neg Hx    Bladder Cancer Neg Hx    Colon cancer Neg Hx      Social History   Tobacco Use   Smoking status: Never   Smokeless tobacco: Never  Vaping Use   Vaping Use: Never used  Substance Use Topics   Alcohol use: Yes    Alcohol/week: 2.0 standard drinks    Types: 1 Glasses of wine, 1 Cans of beer per week    Comment: occasionally   Drug use: No    Allergies as of 05/17/2022   (No Known Allergies)    Review of Systems:    All systems reviewed and negative except where noted in HPI.   Physical Exam:  BP 132/86 (BP Location: Left Arm, Patient Position: Sitting, Cuff Size: Large)   Pulse 67   Temp 98.3 F (36.8 C) (Oral)   Ht '5\' 11"'$  (1.803 m)   Wt 290 lb 4 oz (131.7 kg)   BMI 40.48 kg/m  No LMP for male patient.  General:   Alert,  Well-developed, well-nourished, pleasant and cooperative in NAD Head:  Normocephalic and atraumatic. Eyes:  Sclera clear, no icterus.   Conjunctiva pink. Ears:  Normal auditory acuity. Nose:  No deformity, discharge, or lesions. Mouth:  No deformity or lesions,oropharynx pink & moist. Neck:  Supple; no masses or thyromegaly. Lungs:  Respirations even and unlabored.  Clear throughout to auscultation.   No wheezes, crackles, or rhonchi.  No acute distress. Heart:  Regular rate and rhythm; no murmurs, clicks, rubs, or gallops. Abdomen:  Normal bowel sounds. Soft, mild tenderness in the right upper quadrant and non-distended without masses, hepatosplenomegaly or hernias noted.  No guarding or rebound tenderness.   Rectal: Not performed Msk:  Symmetrical without gross deformities. Good, equal movement & strength bilaterally. Pulses:  Normal pulses noted. Extremities:  No clubbing or edema.  No cyanosis. Neurologic:  Alert and oriented x3;  grossly normal neurologically. Skin:  Intact without significant lesions or rashes. No jaundice. Psych:  Alert and cooperative. Normal mood and affect.  Imaging Studies: Reviewed  Assessment and Plan:  Ilario Dhaliwal is a 51 y.o. male with arthritis of bilateral knees s/p knee replacement, hypertension, hyperlipidemia is seen in consultation for 1 month history of right upper quadrant sharp, intermittent pain, worse postprandial, associated with loss of appetite and some weight loss, history of chronic NSAID use for arthritis pain Differentials include NSAID induced peptic ulcer disease or erosive gastritis or NSAID induced enteropathy H. pylori breath test negative Recommend upper endoscopy to rule out NSAID induced gastritis or peptic ulcer disease Patient has stopped NSAIDs 3 to 4 days ago Advised patient to increase Prilosec 20 mg to twice daily before meals   Follow up based on the above work-up   Cephas Darby, MD

## 2022-05-24 ENCOUNTER — Ambulatory Visit: Payer: 59 | Admitting: Certified Registered"

## 2022-05-24 ENCOUNTER — Encounter
Admission: RE | Disposition: A | Discharge status: Home / Self Care | Payer: Self-pay | Source: Home / Self Care | Attending: Gastroenterology

## 2022-05-24 ENCOUNTER — Ambulatory Visit
Admission: RE | Admit: 2022-05-24 | Discharge: 2022-05-24 | Disposition: A | Discharge status: Home / Self Care | Payer: 59 | Attending: Gastroenterology | Admitting: Gastroenterology

## 2022-05-24 DIAGNOSIS — K2270 Barrett's esophagus without dysplasia: Secondary | ICD-10-CM | POA: Insufficient documentation

## 2022-05-24 DIAGNOSIS — R1011 Right upper quadrant pain: Secondary | ICD-10-CM | POA: Diagnosis present

## 2022-05-24 DIAGNOSIS — I10 Essential (primary) hypertension: Secondary | ICD-10-CM | POA: Insufficient documentation

## 2022-05-24 DIAGNOSIS — K2289 Other specified disease of esophagus: Secondary | ICD-10-CM | POA: Insufficient documentation

## 2022-05-24 DIAGNOSIS — K2100 Gastro-esophageal reflux disease with esophagitis, without bleeding: Secondary | ICD-10-CM | POA: Insufficient documentation

## 2022-05-24 DIAGNOSIS — I2584 Coronary atherosclerosis due to calcified coronary lesion: Secondary | ICD-10-CM | POA: Insufficient documentation

## 2022-05-24 DIAGNOSIS — I2510 Atherosclerotic heart disease of native coronary artery without angina pectoris: Secondary | ICD-10-CM | POA: Insufficient documentation

## 2022-05-24 DIAGNOSIS — R109 Unspecified abdominal pain: Secondary | ICD-10-CM | POA: Diagnosis not present

## 2022-05-24 DIAGNOSIS — M1990 Unspecified osteoarthritis, unspecified site: Secondary | ICD-10-CM | POA: Insufficient documentation

## 2022-05-24 DIAGNOSIS — Z6839 Body mass index (BMI) 39.0-39.9, adult: Secondary | ICD-10-CM | POA: Insufficient documentation

## 2022-05-24 HISTORY — PX: ESOPHAGOGASTRODUODENOSCOPY (EGD) WITH PROPOFOL: SHX5813

## 2022-05-24 SURGERY — ESOPHAGOGASTRODUODENOSCOPY (EGD) WITH PROPOFOL
Anesthesia: General

## 2022-05-24 MED ORDER — LIDOCAINE HCL (CARDIAC) PF 100 MG/5ML IV SOSY
PREFILLED_SYRINGE | INTRAVENOUS | Status: DC | PRN
  Administered 2022-05-24: 100 mg via INTRAVENOUS

## 2022-05-24 MED ORDER — PROPOFOL 1000 MG/100ML IV EMUL
INTRAVENOUS | Status: AC
  Filled 2022-05-24: qty 100

## 2022-05-24 MED ORDER — PROPOFOL 10 MG/ML IV BOLUS
INTRAVENOUS | Status: DC | PRN
  Administered 2022-05-24 (×2): 20 mg via INTRAVENOUS
  Administered 2022-05-24: 100 mg via INTRAVENOUS

## 2022-05-24 MED ORDER — PROPOFOL 500 MG/50ML IV EMUL
INTRAVENOUS | Status: DC | PRN
  Administered 2022-05-24: 150 ug/kg/min via INTRAVENOUS

## 2022-05-24 MED ORDER — OMEPRAZOLE 40 MG PO CPDR
40.0000 mg | DELAYED_RELEASE_CAPSULE | Freq: Two times a day (BID) | ORAL | 1 refills | Status: DC
Start: 2022-05-24 — End: 2022-07-24

## 2022-05-24 MED ORDER — DEXMEDETOMIDINE (PRECEDEX) IN NS 20 MCG/5ML (4 MCG/ML) IV SYRINGE
PREFILLED_SYRINGE | INTRAVENOUS | Status: DC | PRN
  Administered 2022-05-24: 12 ug via INTRAVENOUS

## 2022-05-24 MED ORDER — SODIUM CHLORIDE 0.9 % IV SOLN
INTRAVENOUS | Status: DC
  Administered 2022-05-24: 20 mL/h via INTRAVENOUS

## 2022-05-24 NOTE — Op Note (Signed)
[facility] Gastroenterology Patient Name: Kenneth Sherman Procedure Date: 05/24/2022 9:52 AM MRN: [id number] Account #: [id number] Date of Birth: 15-Jan-1971 Admit Type: Outpatient Age: 51 Room: [facility] ENDO ROOM 4 Gender: Male Note Status: Finalized Instrument Name: Upper Endoscope [id number] Procedure:             Upper GI endoscopy Indications:           Abdominal pain in the right upper quadrant Providers:             Lin Landsman MD, MD Referring MD:          Jearld Fenton (Referring MD) Medicines:             General Anesthesia Complications:         No immediate complications. Estimated blood loss: None. Procedure:             Pre-Anesthesia Assessment:                        - Prior to the procedure, a History and Physical was                         performed, and patient medications and allergies were                         reviewed. The patient is competent. The risks and                         benefits of the procedure and the sedation options and                         risks were discussed with the patient. All questions                         were answered and informed consent was obtained.                         Patient identification and proposed procedure were                         verified by the physician, the nurse, the                         anesthesiologist, the anesthetist and the technician                         in the pre-procedure area in the procedure room in the                         endoscopy suite. Mental Status Examination: alert and                         oriented. Airway Examination: normal oropharyngeal                         airway and neck mobility. Respiratory Examination:                         clear to auscultation. CV Examination: normal.  Prophylactic Antibiotics: The patient does not require                         prophylactic antibiotics. Prior Anticoagulants: The                          patient has taken no previous anticoagulant or                         antiplatelet agents. ASA Grade Assessment: III - A                         patient with severe systemic disease. After reviewing                         the risks and benefits, the patient was deemed in                         satisfactory condition to undergo the procedure. The                         anesthesia plan was to use general anesthesia.                         Immediately prior to administration of medications,                         the patient was re-assessed for adequacy to receive                         sedatives. The heart rate, respiratory rate, oxygen                         saturations, blood pressure, adequacy of pulmonary                         ventilation, and response to care were monitored                         throughout the procedure. The physical status of the                         patient was re-assessed after the procedure.                        After obtaining informed consent, the endoscope was                         passed under direct vision. Throughout the procedure,                         the patient's blood pressure, pulse, and oxygen                         saturations were monitored continuously. The Endoscope                         was introduced through the mouth, and advanced to the  second part of duodenum. The upper GI endoscopy was                         accomplished without difficulty. The patient tolerated                         the procedure well. Findings:      The duodenal bulb and second portion of the duodenum were normal.      The entire examined stomach was normal. Biopsies were taken with a cold       forceps for histology.      The cardia and gastric fundus were normal on retroflexion.      Two tongues of salmon-colored mucosa were present from 36 to 38 cm. No       other visible abnormalities were present. The maximum  longitudinal       extent of these esophageal mucosal changes was 2 cm in length. Biopsies       were taken with a cold forceps for histology. Impression:            - Normal duodenal bulb and second portion of the                         duodenum.                        - Normal stomach. Biopsied.                        - Salmon-colored mucosa suspicious for short-segment                         Barrett's esophagus. Biopsied. Recommendation:        - Await pathology results.                        - Discharge patient to home (with spouse).                        - Resume previous diet today.                        - Continue present medications.                        - Use Prilosec (omeprazole) 40 mg PO BID for 1 month. Procedure Code(s):     --- Professional ---                        [phone], Esophagogastroduodenoscopy, flexible,                         transoral; with biopsy, single or multiple Diagnosis Code(s):     --- Professional ---                        K22.8, Other specified diseases of esophagus                        R10.11, Right upper quadrant pain CPT copyright 2019 American Medical Association. All rights reserved. The codes documented in this report are preliminary and upon coder review  may  be revised to meet current compliance requirements. Dr. Ulyess Mort Lin Landsman MD, MD 05/24/2022 10:16:30 AM This report has been signed electronically. Number of Addenda: 0 Note Initiated On: 05/24/2022 9:52 AM Estimated Blood Loss:  Estimated blood loss: none.      [facility]

## 2022-05-24 NOTE — Anesthesia Procedure Notes (Signed)
Procedure Name: MAC Date/Time: 05/24/2022 10:03 AM  Performed by: Biagio Borg, CRNAPre-anesthesia Checklist: Patient identified, Emergency Drugs available, Suction available, Patient being monitored and Timeout performed Patient Re-evaluated:Patient Re-evaluated prior to induction Oxygen Delivery Method: Nasal cannula Induction Type: IV induction Placement Confirmation: positive ETCO2 and CO2 detector

## 2022-05-24 NOTE — Transfer of Care (Signed)
Immediate Anesthesia Transfer of Care Note  Patient: Kofi Murrell  Procedure(s) Performed: ESOPHAGOGASTRODUODENOSCOPY (EGD) WITH PROPOFOL  Patient Location: PACU and Endoscopy Unit  Anesthesia Type:General  Level of Consciousness: awake  Airway & Oxygen Therapy: Patient Spontanous Breathing  Post-op Assessment: Report given to RN and Post -op Vital signs reviewed and stable  Post vital signs: Reviewed and stable  Last Vitals:  Vitals Value Taken Time  BP 88/64 05/24/22 1019  Temp    Pulse 88 05/24/22 1019  Resp 15 05/24/22 1019  SpO2 95 % 05/24/22 1019  Vitals shown include unvalidated device data.  Last Pain:  Vitals:   05/24/22 1019  TempSrc:   PainSc: 0-No pain         Complications: No notable events documented.

## 2022-05-24 NOTE — Anesthesia Preprocedure Evaluation (Addendum)
Anesthesia Evaluation  Patient identified by MRN, date of birth, ID band Patient awake    Reviewed: Allergy & Precautions, NPO status , Patient's Chart, lab work & pertinent test results, reviewed documented beta blocker date and time   History of Anesthesia Complications Negative for: history of anesthetic complications  Airway Mallampati: III       Dental no notable dental hx.    Pulmonary neg sleep apnea, neg COPD, Not current smoker,    Pulmonary exam normal        Cardiovascular hypertension, Pt. on medications and Pt. on home beta blockers (-) Past MI and (-) CHF Normal cardiovascular exam(-) dysrhythmias (-) Valvular Problems/Murmurs  Coronary atherosclerosis due to calcified coronary lesion   Neuro/Psych  Headaches, neg Seizures    GI/Hepatic Neg liver ROS, GERD  Medicated and Controlled,  Endo/Other  neg diabetesMorbid obesity  Renal/GU negative Renal ROS     Musculoskeletal  (+) Arthritis ,   Abdominal (+) + obese,   Peds  Hematology   Anesthesia Other Findings   Reproductive/Obstetrics                            Anesthesia Physical  Anesthesia Plan  ASA: 3  Anesthesia Plan: General   Post-op Pain Management:    Induction: Intravenous  PONV Risk Score and Plan: 2 and Propofol infusion and TIVA  Airway Management Planned: Natural Airway  Additional Equipment:   Intra-op Plan:   Post-operative Plan:   Informed Consent: I have reviewed the patients History and Physical, chart, labs and discussed the procedure including the risks, benefits and alternatives for the proposed anesthesia with the patient or authorized representative who has indicated his/her understanding and acceptance.     Dental advisory given  Plan Discussed with: CRNA and Anesthesiologist  Anesthesia Plan Comments:        Anesthesia Quick Evaluation

## 2022-05-24 NOTE — H&P (Signed)
Kenneth Darby, MD [address]  [place]  [place], [place] [address]  Main: [phone]  Fax: [phone] Pager: [phone]  Primary Care Physician:  Jearld Fenton, NP Primary Gastroenterologist:  Dr. Cephas Sherman  Pre-Procedure History & Physical: HPI:  Kenneth Sherman is a 51 y.o. male is here for an endoscopy.   Past Medical History:  Diagnosis Date   Arthritis    Coronary atherosclerosis due to calcified coronary lesion    GERD (gastroesophageal reflux disease)    Heartburn    HLD (hyperlipidemia)    Hypertension    no meds   Hypogonadism in male    Right knee DJD    Thyroid disease     Past Surgical History:  Procedure Laterality Date   BICEPS TENDON REPAIR  05/2015   COLONOSCOPY WITH PROPOFOL N/A 04/01/2021   Procedure: COLONOSCOPY WITH PROPOFOL;  Surgeon: Virgel Manifold, MD;  Location: [facility] ENDOSCOPY;  Service: Endoscopy;  Laterality: N/A;   KNEE ARTHROSCOPY W/ MENISCECTOMY  2008   left   KNEE ARTHROSCOPY W/ MENISCECTOMY  2011   right   PARTIAL KNEE ARTHROPLASTY  11/29/2012   Procedure: UNICOMPARTMENTAL KNEE;  Surgeon: Lorn Junes, MD;  Location: [place];  Service: Orthopedics;  Laterality: Right;    Prior to Admission medications   Medication Sig Start Date End Date Taking? Authorizing Provider  aspirin 81 MG EC tablet Take 1 tablet (81 mg total) by mouth daily. Swallow whole. 02/27/22  Yes Baity, Coralie Keens, NP  atorvastatin (LIPITOR) 10 MG tablet TAKE 1 TABLET BY MOUTH EVERY DAY 04/25/22  Yes Baity, Coralie Keens, NP  diclofenac (VOLTAREN) 75 MG EC tablet Take 75 mg by mouth 2 (two) times daily. 04/28/22  Yes [provider]  ezetimibe (ZETIA) 10 MG tablet Take 1 tablet (10 mg total) by mouth daily. 02/28/22  Yes Jearld Fenton, NP  lisinopril-hydrochlorothiazide (ZESTORETIC) 10-12.5 MG tablet TAKE 1 TABLET BY MOUTH EVERY DAY 04/27/22  Yes Baity, Coralie Keens, NP  metoprolol succinate (TOPROL-XL) 25 MG 24 hr tablet TAKE 1 TABLET (25 MG TOTAL) BY  MOUTH IN THE MORNING. 03/09/22 06/07/22 Yes Tolia, Sunit, DO  nortriptyline (PAMELOR) 50 MG capsule TAKE 1 CAPSULE BY MOUTH AT BEDTIME. 03/14/22  Yes Baity, Coralie Keens, NP  omeprazole (PRILOSEC) 20 MG capsule TAKE 1 CAPSULE BY MOUTH EVERY DAY 02/27/22  Yes Jearld Fenton, NP  sildenafil (REVATIO) 20 MG tablet TAKE 2-5 TABS AS NEEDED FOR SEXUAL INTERCOURSE *MAX PER INSURANCE* 04/22/21  Yes Jearld Fenton, NP  sucralfate (CARAFATE) 1 g tablet Take 1 tablet (1 g total) by mouth 4 (four) times daily -  with meals and at bedtime. 04/24/22  Yes Jearld Fenton, NP    Allergies as of 05/17/2022   (No Known Allergies)    Family History  Problem Relation Age of Onset   Heart attack Mother    Hypertension Mother    Hypertension Father    Heart attack Father    Hypertension Brother    Healthy Brother    Healthy Brother    Healthy Brother    Kidney disease Neg Hx    Prostate cancer Neg Hx    Bladder Cancer Neg Hx    Colon cancer Neg Hx     Social History   Socioeconomic History   Marital status: Married    Spouse name: Not on file   Number of children: Not on file   Years of education: 14   Highest education level:  Not on file  Occupational History   Occupation: CAD Games developer: [employer]  Tobacco Use   Smoking status: Never   Smokeless tobacco: Never  Vaping Use   Vaping Use: Never used  Substance and Sexual Activity   Alcohol use: Yes    Alcohol/week: 2.0 standard drinks of alcohol    Types: 1 Glasses of wine, 1 Cans of beer per week    Comment: occasionally   Drug use: No   Sexual activity: Yes    Birth control/protection: Condom  Other Topics Concern   Not on file  Social History Narrative   Regular exercise-no   Caffeine Use-yes   Social Determinants of Health   Financial Resource Strain: Not on file  Food Insecurity: Not on file  Transportation Needs: Not on file  Physical Activity: Not on file  Stress: Not on file  Social Connections: Not on file   Intimate Partner Violence: Not on file    Review of Systems: See HPI, otherwise negative ROS  Physical Exam: BP 124/82   Pulse 85   Temp (!) 97.2 F (36.2 C) (Temporal)   Resp 20   Ht '5\' 11"'$  (1.803 m)   Wt 129.3 kg   SpO2 100%   BMI 39.75 kg/m  General:   Alert,  pleasant and cooperative in NAD Head:  Normocephalic and atraumatic. Neck:  Supple; no masses or thyromegaly. Lungs:  Clear throughout to auscultation.    Heart:  Regular rate and rhythm. Abdomen:  Soft, nontender and nondistended. Normal bowel sounds, without guarding, and without rebound.   Neurologic:  Alert and  oriented x4;  grossly normal neurologically.  Impression/Plan: Kenneth Sherman is here for an endoscopy to be performed for right upper quadrant pain  Risks, benefits, limitations, and alternatives regarding  endoscopy have been reviewed with the patient.  Questions have been answered.  All parties agreeable.   Sherri Sear, MD  05/24/2022, 9:46 AM

## 2022-05-25 NOTE — Anesthesia Postprocedure Evaluation (Signed)
Anesthesia Post Note  Patient: Kenneth Sherman  Procedure(s) Performed: ESOPHAGOGASTRODUODENOSCOPY (EGD) WITH PROPOFOL  Patient location during evaluation: Endoscopy Anesthesia Type: General Level of consciousness: awake and alert Pain management: pain level controlled Vital Signs Assessment: post-procedure vital signs reviewed and stable Respiratory status: spontaneous breathing, nonlabored ventilation and respiratory function stable Cardiovascular status: blood pressure returned to baseline and stable Postop Assessment: no apparent nausea or vomiting Anesthetic complications: no   No notable events documented.   Last Vitals:  Vitals:   05/24/22 1019 05/24/22 1021  BP: 103/73 117/73  Pulse:    Resp:    Temp:    SpO2:      Last Pain:  Vitals:   05/24/22 1057  TempSrc:   PainSc: 0-No pain                 [place] Ouch

## 2022-05-26 ENCOUNTER — Encounter: Payer: Self-pay | Admitting: Gastroenterology

## 2022-05-26 LAB — SURGICAL PATHOLOGY

## 2022-05-29 ENCOUNTER — Encounter: Payer: Self-pay | Admitting: Gastroenterology

## 2022-05-30 ENCOUNTER — Encounter: Payer: Self-pay | Admitting: Gastroenterology

## 2022-07-09 ENCOUNTER — Other Ambulatory Visit: Payer: Self-pay | Admitting: Internal Medicine

## 2022-07-11 NOTE — Telephone Encounter (Signed)
Refilled 02/28/2022 #90 1 refill - a 6 month supply Requested Prescriptions  Pending Prescriptions Disp Refills  . ezetimibe (ZETIA) 10 MG tablet [Pharmacy Med Name: EZETIMIBE 10 MG TABLET] 90 tablet 1    Sig: TAKE 1 TABLET BY MOUTH EVERY DAY     Cardiovascular:  Antilipid - Sterol Transport Inhibitors Failed - 07/09/2022  3:33 PM      Failed - Lipid Panel in normal range within the last 12 months    Cholesterol  Date Value Ref Range Status  02/27/2022 165 <200 mg/dL Final   LDL Cholesterol (Calc)  Date Value Ref Range Status  02/27/2022 95 mg/dL (calc) Final    Comment:    Reference range: <100 . Desirable range <100 mg/dL for primary prevention;   <70 mg/dL for patients with CHD or diabetic patients  with > or = 2 CHD risk factors. Marland Kitchen LDL-C is now calculated using the Martin-Hopkins  calculation, which is a validated novel method providing  better accuracy than the Friedewald equation in the  estimation of LDL-C.  Cresenciano Genre et al. Annamaria Helling. 9371;696(78): 2061-2068  (http://education.[employer].com/faq/FAQ164)    Direct LDL  Date Value Ref Range Status  06/04/2018 157.0 mg/dL Final    Comment:    Optimal:  <100 mg/dLNear or Above Optimal:  100-129 mg/dLBorderline High:  130-159 mg/dLHigh:  160-189 mg/dLVery High:  >190 mg/dL   HDL  Date Value Ref Range Status  02/27/2022 38 (L) > OR = 40 mg/dL Final   Triglycerides  Date Value Ref Range Status  02/27/2022 220 (H) <150 mg/dL Final    Comment:    . If a non-fasting specimen was collected, consider repeat triglyceride testing on a fasting specimen if clinically indicated.  Yates Decamp et al. J. of Clin. Lipidol. 9381;0:175-102. Marland Kitchen          Passed - AST in normal range and within 360 days    AST  Date Value Ref Range Status  04/20/2022 22 10 - 35 U/L Final         Passed - ALT in normal range and within 360 days    ALT  Date Value Ref Range Status  04/20/2022 27 9 - 46 U/L Final         Passed - Patient is  not pregnant      Passed - Valid encounter within last 12 months    Recent Outpatient Visits          2 months ago Generalized abdominal pain   [place], Coralie Keens, NP   4 months ago Encounter for general adult medical examination with abnormal findings   [facility], Coralie Keens, NP      Future Appointments            In 1 month Baity, Coralie Keens, NP [facility], [facility]

## 2022-07-21 ENCOUNTER — Other Ambulatory Visit: Payer: Self-pay | Admitting: Gastroenterology

## 2022-08-28 ENCOUNTER — Encounter: Payer: Self-pay | Admitting: Internal Medicine

## 2022-08-31 ENCOUNTER — Encounter: Payer: Self-pay | Admitting: Internal Medicine

## 2022-08-31 ENCOUNTER — Ambulatory Visit (INDEPENDENT_AMBULATORY_CARE_PROVIDER_SITE_OTHER): Payer: 59 | Admitting: Internal Medicine

## 2022-08-31 ENCOUNTER — Other Ambulatory Visit: Payer: Self-pay | Admitting: Internal Medicine

## 2022-08-31 VITALS — BP 128/84 | HR 91 | Temp 97.5°F | Wt 303.0 lb

## 2022-08-31 DIAGNOSIS — M170 Bilateral primary osteoarthritis of knee: Secondary | ICD-10-CM | POA: Diagnosis not present

## 2022-08-31 DIAGNOSIS — I10 Essential (primary) hypertension: Secondary | ICD-10-CM

## 2022-08-31 DIAGNOSIS — E7800 Pure hypercholesterolemia, unspecified: Secondary | ICD-10-CM

## 2022-08-31 DIAGNOSIS — R7303 Prediabetes: Secondary | ICD-10-CM

## 2022-08-31 DIAGNOSIS — N528 Other male erectile dysfunction: Secondary | ICD-10-CM

## 2022-08-31 DIAGNOSIS — F411 Generalized anxiety disorder: Secondary | ICD-10-CM

## 2022-08-31 DIAGNOSIS — E349 Endocrine disorder, unspecified: Secondary | ICD-10-CM

## 2022-08-31 DIAGNOSIS — E119 Type 2 diabetes mellitus without complications: Secondary | ICD-10-CM | POA: Insufficient documentation

## 2022-08-31 DIAGNOSIS — K219 Gastro-esophageal reflux disease without esophagitis: Secondary | ICD-10-CM

## 2022-08-31 DIAGNOSIS — R519 Headache, unspecified: Secondary | ICD-10-CM

## 2022-08-31 NOTE — Assessment & Plan Note (Signed)
Continue nortriptyline and metoprolol Try to identify triggers and avoid them

## 2022-08-31 NOTE — Patient Instructions (Signed)

## 2022-08-31 NOTE — Assessment & Plan Note (Signed)
A1c today Encourage low-carb diet and exercise for weight loss 

## 2022-08-31 NOTE — Telephone Encounter (Signed)
Requested medication (s) are due for refill today: yes  Requested medication (s) are on the active medication list: yes  Last refill:  04/22/21  Future visit scheduled:yes  Notes to clinic:  Unable to refill per protocol, Rx not attached to protocol, routing for review.     Requested Prescriptions  Pending Prescriptions Disp Refills   sildenafil (REVATIO) 20 MG tablet [Pharmacy Med Name: SILDENAFIL 20 MG TABLET] 15 tablet 3    Sig: TAKE 2-5 TABS AS NEEDED FOR SEXUAL INTERCOURSE *MAX PER INSURANCE*     There is no refill protocol information for this order

## 2022-08-31 NOTE — Assessment & Plan Note (Signed)
Controlled on lisinopril HCT and metoprolol Reinforced DASH diet and exercise for weight loss C-Met today

## 2022-08-31 NOTE — Assessment & Plan Note (Signed)
Encourage diet and exercise for weight loss 

## 2022-08-31 NOTE — Assessment & Plan Note (Signed)
Encourage weight loss as this can help reduce joint pain Consider Tylenol arthritis if Celebrex is causing GI upset

## 2022-08-31 NOTE — Assessment & Plan Note (Signed)
C-Met and lipid profile today Encouraged him to consume a low-fat diet Continue atorvastatin and ezetimibe

## 2022-08-31 NOTE — Assessment & Plan Note (Signed)
Try to identify and avoid foods that trigger reflux Encourage weight loss as this can help reduce reflux symptoms Continue omeprazole  

## 2022-08-31 NOTE — Progress Notes (Signed)
Subjective:    Patient ID: Kenneth Sherman, male    DOB: 05/30/1971, 51 y.o.   MRN: [id number]  HPI  Patient presents to clinic today for 12-monthfollow-up of chronic conditions.  Frequent Headaches: These occur a few times per month.  He is not sure what trigger this. He is taking Nortriptyline and Metoprolol as prescribed.  He does not follow with neurology.  ED: He has difficulty maintaining erection.  He takes Sildenafil as needed with good results.  He does not follow with urology.  GAD: Situational.  He is not taking any medications for this at this time.  He is not seeing a therapist.  He denies depression, SI/HI.  GERD: He is not sure what triggers. He denies breakthrough on Omeprazole.  Upper from 05/2022 reviewed.  HTN: His BP today is 128/84.  He is taking Lisinopril HCT and Metoprolol as prescribed.  ECG from 10/2021 reviewed.  HLD: His last LDL was 95, triglycerides 220, 02/2022.  He denies myalgias on Atorvastatin and Ezetimibe.  He does not consume a low-fat diet.  Hypotestosteronism: He is not being treated for this status post biceps tendon rupture.  He is not currently following with urology.  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  He takes Celebrex as needed with good relief of symptoms.  He does not follow with orthopedics.  Prediabetes: His last A1c was 6.3%.  He is not taking any oral diabetic medication at this time.  He does not check his sugars.  Review of Systems   Past Medical History:  Diagnosis Date   Arthritis    Coronary atherosclerosis due to calcified coronary lesion    GERD (gastroesophageal reflux disease)    Heartburn    HLD (hyperlipidemia)    Hypertension    no meds   Hypogonadism in male    Right knee DJD    Thyroid disease     Current Outpatient Medications  Medication Sig Dispense Refill   aspirin 81 MG EC tablet Take 1 tablet (81 mg total) by mouth daily. Swallow whole. 30 tablet 12   atorvastatin (LIPITOR) 10 MG tablet TAKE 1 TABLET BY MOUTH  EVERY DAY 90 tablet 1   ezetimibe (ZETIA) 10 MG tablet Take 1 tablet (10 mg total) by mouth daily. 90 tablet 1   lisinopril-hydrochlorothiazide (ZESTORETIC) 10-12.5 MG tablet TAKE 1 TABLET BY MOUTH EVERY DAY 90 tablet 1   metoprolol succinate (TOPROL-XL) 25 MG 24 hr tablet TAKE 1 TABLET (25 MG TOTAL) BY MOUTH IN THE MORNING. 90 tablet 2   nortriptyline (PAMELOR) 50 MG capsule TAKE 1 CAPSULE BY MOUTH AT BEDTIME. 90 capsule 1   omeprazole (PRILOSEC) 40 MG capsule TAKE 1 CAPSULE (40 MG TOTAL) BY MOUTH 2 (TWO) TIMES DAILY BEFORE A MEAL. 60 capsule 1   sildenafil (REVATIO) 20 MG tablet TAKE 2-5 TABS AS NEEDED FOR SEXUAL INTERCOURSE *MAX PER INSURANCE* 15 tablet 3   No current facility-administered medications for this visit.    No Known Allergies  Family History  Problem Relation Age of Onset   Heart attack Mother    Hypertension Mother    Hypertension Father    Heart attack Father    Hypertension Brother    Healthy Brother    Healthy Brother    Healthy Brother    Kidney disease Neg Hx    Prostate cancer Neg Hx    Bladder Cancer Neg Hx    Colon cancer Neg Hx     Social History   Socioeconomic History  Marital status: Married    Spouse name: Not on file   Number of children: Not on file   Years of education: 14   Highest education level: Not on file  Occupational History   Occupation: CAD Games developer: [employer]  Tobacco Use   Smoking status: Never   Smokeless tobacco: Never  Vaping Use   Vaping Use: Never used  Substance and Sexual Activity   Alcohol use: Yes    Alcohol/week: 2.0 standard drinks of alcohol    Types: 1 Glasses of wine, 1 Cans of beer per week    Comment: occasionally   Drug use: No   Sexual activity: Yes    Birth control/protection: Condom  Other Topics Concern   Not on file  Social History Narrative   Regular exercise-no   Caffeine Use-yes   Social Determinants of Health   Financial Resource Strain: Not on file  Food Insecurity:  Not on file  Transportation Needs: Not on file  Physical Activity: Not on file  Stress: Not on file  Social Connections: Not on file  Intimate Partner Violence: Not on file     Constitutional: Patient reports intermittent headaches.  Denies fever, malaise, fatigue, or abrupt weight changes.  HEENT: Denies eye pain, eye redness, ear pain, ringing in the ears, wax buildup, runny nose, nasal congestion, bloody nose, or sore throat. Respiratory: Denies difficulty breathing, shortness of breath, cough or sputum production.   Cardiovascular: Denies chest pain, chest tightness, palpitations or swelling in the hands or feet.  Gastrointestinal: Denies abdominal pain, bloating, constipation, diarrhea or blood in the stool.  GU: Patient reports erectile dysfunction.  Denies urgency, frequency, pain with urination, burning sensation, blood in urine, odor or discharge. Musculoskeletal: Patient reports intermittent knee pain.  Denies decrease in range of motion, difficulty with gait, muscle pain or joint swelling.  Skin: Denies redness, rashes, lesions or ulcercations.  Neurological: Denies dizziness, difficulty with memory, difficulty with speech or problems with balance and coordination.  Psych: Patient has a history of anxiety.  Denies depression, SI/HI.  No other specific complaints in a complete review of systems (except as listed in HPI above).  Objective:   Physical Exam  BP 128/84 (BP Location: Left Arm, Patient Position: Sitting, Cuff Size: Large)   Pulse 91   Temp (!) 97.5 F (36.4 C) (Temporal)   Wt (!) 303 lb (137.4 kg)   SpO2 98%   BMI 42.26 kg/m   Wt Readings from Last 3 Encounters:  05/24/22 285 lb (129.3 kg)  05/17/22 290 lb 4 oz (131.7 kg)  04/20/22 294 lb (133.4 kg)    General: Appears her stated age, obese, in NAD. Skin: Warm, dry and intact. HEENT: Head: normal shape and size; Eyes: sclera white, no icterus, conjunctiva pink, PERRLA and EOMs intact;  Cardiovascular:  Normal rate and rhythm. S1,S2 noted.  No murmur, rubs or gallops noted. Trace BLE edema. No carotid bruits noted. Pulmonary/Chest: Normal effort and positive vesicular breath sounds. No respiratory distress. No wheezes, rales or ronchi noted.  Abdomen: Normal bowel sounds Musculoskeletal: Strength 5/5 BUE/BLE. No difficulty with gait.  Neurological: Alert and oriented. Cranial nerves II-XII grossly intact. Coordination normal.  Psychiatric: Mood and affect normal. Behavior is normal. Judgment and thought content normal.   BMET    Component Value Date/Time   NA 138 04/20/2022 0850   K 4.8 04/20/2022 0850   CL 99 04/20/2022 0850   CO2 31 04/20/2022 0850   GLUCOSE 110 (H)  04/20/2022 0850   BUN 15 04/20/2022 0850   CREATININE 1.23 04/20/2022 0850   CALCIUM 9.4 04/20/2022 0850   GFRNONAA >60 06/11/2018 1018   GFRAA >60 06/11/2018 1018    Lipid Panel     Component Value Date/Time   CHOL 165 02/27/2022 1343   TRIG 220 (H) 02/27/2022 1343   HDL 38 (L) 02/27/2022 1343   CHOLHDL 4.3 02/27/2022 1343   VLDL 19.8 03/26/2020 0849   LDLCALC 95 02/27/2022 1343    CBC    Component Value Date/Time   WBC 7.1 04/20/2022 0850   RBC 4.96 04/20/2022 0850   HGB 13.8 04/20/2022 0850   HGB 14.8 12/07/2017 0847   HCT 41.8 04/20/2022 0850   HCT 43.4 12/07/2017 0847   PLT 296 04/20/2022 0850   MCV 84.3 04/20/2022 0850   MCH 27.8 04/20/2022 0850   MCHC 33.0 04/20/2022 0850   RDW 14.0 04/20/2022 0850   LYMPHSABS 2.2 12/30/2020 1600   MONOABS 0.5 12/30/2020 1600   EOSABS 0.3 12/30/2020 1600   BASOSABS 0.1 12/30/2020 1600    Hgb A1C Lab Results  Component Value Date   HGBA1C 6.3 (H) 02/27/2022            Assessment & Plan:   RTC in 6 months for your annual exam Webb Silversmith, NP

## 2022-08-31 NOTE — Assessment & Plan Note (Signed)
Continue sildenafil as needed. 

## 2022-08-31 NOTE — Assessment & Plan Note (Signed)
Currently not being treated

## 2022-08-31 NOTE — Assessment & Plan Note (Signed)
Currently not taking any medication for this Support offered

## 2022-09-01 LAB — CBC
HCT: 40.6 % (ref 38.5–50.0)
Hemoglobin: 14 g/dL (ref 13.2–17.1)
MCH: 27.8 pg (ref 27.0–33.0)
MCHC: 34.5 g/dL (ref 32.0–36.0)
MCV: 80.7 fL (ref 80.0–100.0)
MPV: 8.8 fL (ref 7.5–12.5)
Platelets: 292 10*3/uL (ref 140–400)
RBC: 5.03 10*6/uL (ref 4.20–5.80)
RDW: 13.5 % (ref 11.0–15.0)
WBC: 8.6 10*3/uL (ref 3.8–10.8)

## 2022-09-01 LAB — COMPLETE METABOLIC PANEL WITH GFR
AG Ratio: 1.6 (calc) (ref 1.0–2.5)
ALT: 24 U/L (ref 9–46)
AST: 18 U/L (ref 10–35)
Albumin: 4.5 g/dL (ref 3.6–5.1)
Alkaline phosphatase (APISO): 87 U/L (ref 35–144)
BUN: 12 mg/dL (ref 7–25)
CO2: 28 mmol/L (ref 20–32)
Calcium: 8.8 mg/dL (ref 8.6–10.3)
Chloride: 100 mmol/L (ref 98–110)
Creat: 1.15 mg/dL (ref 0.70–1.30)
Globulin: 2.8 g/dL (calc) (ref 1.9–3.7)
Glucose, Bld: 128 mg/dL — ABNORMAL HIGH (ref 65–99)
Potassium: 3.6 mmol/L (ref 3.5–5.3)
Sodium: 138 mmol/L (ref 135–146)
Total Bilirubin: 0.3 mg/dL (ref 0.2–1.2)
Total Protein: 7.3 g/dL (ref 6.1–8.1)
eGFR: 77 mL/min/{1.73_m2} (ref >=60)

## 2022-09-01 LAB — LIPID PANEL
Cholesterol: 142 mg/dL (ref <200)
HDL: 43 mg/dL (ref >=40)
LDL Cholesterol (Calc): 74 mg/dL (calc)
Non-HDL Cholesterol (Calc): 99 mg/dL (calc) (ref <130)
Total CHOL/HDL Ratio: 3.3 (calc) (ref <5.0)
Triglycerides: 177 mg/dL — ABNORMAL HIGH (ref <150)

## 2022-09-01 LAB — HEMOGLOBIN A1C
Hgb A1c MFr Bld: 6.4 % of total Hgb — ABNORMAL HIGH (ref <5.7)
Mean Plasma Glucose: 137 mg/dL
eAG (mmol/L): 7.6 mmol/L

## 2022-09-25 ENCOUNTER — Other Ambulatory Visit: Payer: Self-pay | Admitting: Internal Medicine

## 2022-09-25 ENCOUNTER — Other Ambulatory Visit: Payer: Self-pay | Admitting: Gastroenterology

## 2022-09-25 NOTE — Telephone Encounter (Signed)
On 05/24/2022 you wanted patient to do omeprazole '40mg'$  BID for 1 month.  At office visit on 05/17/2022 you wanted '20mg'$  BID   Which one do you want patient on

## 2022-09-25 NOTE — Telephone Encounter (Signed)
Requested Prescriptions  Pending Prescriptions Disp Refills  . nortriptyline (PAMELOR) 50 MG capsule [Pharmacy Med Name: NORTRIPTYLINE HCL 50 MG CAP] 30 capsule 5    Sig: TAKE 1 CAPSULE BY MOUTH EVERYDAY AT BEDTIME     Psychiatry:  Antidepressants - Heterocyclics (TCAs) Passed - 09/25/2022  1:37 AM      Passed - Valid encounter within last 6 months    Recent Outpatient Visits          3 weeks ago Prediabetes   [facility] [place], Coralie Keens, NP   5 months ago Generalized abdominal pain   [facility] [place], Coralie Keens, NP   7 months ago Encounter for general adult medical examination with abnormal findings   [facility] [place], Coralie Keens, NP

## 2022-10-13 ENCOUNTER — Other Ambulatory Visit: Payer: Self-pay | Admitting: Internal Medicine

## 2022-10-13 DIAGNOSIS — Z0000 Encounter for general adult medical examination without abnormal findings: Secondary | ICD-10-CM

## 2022-11-22 ENCOUNTER — Encounter: Payer: Self-pay | Admitting: Internal Medicine

## 2022-11-26 ENCOUNTER — Other Ambulatory Visit: Payer: Self-pay | Admitting: Gastroenterology

## 2023-01-04 ENCOUNTER — Other Ambulatory Visit: Payer: Self-pay | Admitting: Internal Medicine

## 2023-01-04 ENCOUNTER — Other Ambulatory Visit: Payer: Self-pay | Admitting: Cardiology

## 2023-01-04 DIAGNOSIS — Z0000 Encounter for general adult medical examination without abnormal findings: Secondary | ICD-10-CM

## 2023-01-04 DIAGNOSIS — I10 Essential (primary) hypertension: Secondary | ICD-10-CM

## 2023-01-04 DIAGNOSIS — R0609 Other forms of dyspnea: Secondary | ICD-10-CM

## 2023-01-04 DIAGNOSIS — Z01810 Encounter for preprocedural cardiovascular examination: Secondary | ICD-10-CM

## 2023-01-04 NOTE — Telephone Encounter (Signed)
Requested Prescriptions  Pending Prescriptions Disp Refills   lisinopril-hydrochlorothiazide (ZESTORETIC) 10-12.5 MG tablet [Pharmacy Med Name: LISINOPRIL-HCTZ 10-12.5 MG TAB] 90 tablet 0    Sig: TAKE 1 TABLET BY MOUTH EVERY DAY     Cardiovascular:  ACEI + Diuretic Combos Passed - 01/04/2023 11:34 AM      Passed - Na in normal range and within 180 days    Sodium  Date Value Ref Range Status  08/31/2022 138 135 - 146 mmol/L Final         Passed - K in normal range and within 180 days    Potassium  Date Value Ref Range Status  08/31/2022 3.6 3.5 - 5.3 mmol/L Final         Passed - Cr in normal range and within 180 days    Creat  Date Value Ref Range Status  08/31/2022 1.15 0.70 - 1.30 mg/dL Final         Passed - eGFR is 30 or above and within 180 days    GFR calc Af Amer  Date Value Ref Range Status  06/11/2018 >60 >60 mL/min Final    Comment:    (NOTE) The eGFR has been calculated using the CKD EPI equation. This calculation has not been validated in all clinical situations. eGFR's persistently <60 mL/min signify possible Chronic Kidney Disease.    GFR calc non Af Amer  Date Value Ref Range Status  06/11/2018 >60 >60 mL/min Final   GFR  Date Value Ref Range Status  12/30/2020 74.24 >60.00 mL/min Final    Comment:    Calculated using the CKD-EPI Creatinine Equation (2021)   eGFR  Date Value Ref Range Status  08/31/2022 77 > OR = 60 mL/min/1.74m Final         Passed - Patient is not pregnant      Passed - Last BP in normal range    BP Readings from Last 1 Encounters:  08/31/22 128/84         Passed - Valid encounter within last 6 months    Recent Outpatient Visits           4 months [facility]   8 months ago [facility]   10 months ago [facility]counter for general adult medical examination with abnormal findings    [facility]

## 2023-01-27 ENCOUNTER — Other Ambulatory Visit: Payer: Self-pay | Admitting: Internal Medicine

## 2023-01-27 ENCOUNTER — Other Ambulatory Visit: Payer: Self-pay | Admitting: Gastroenterology

## 2023-01-29 ENCOUNTER — Encounter: Payer: Self-pay | Admitting: Internal Medicine

## 2023-01-29 NOTE — Telephone Encounter (Signed)
Requested Prescriptions  Pending Prescriptions Disp Refills   sildenafil (REVATIO) 20 MG tablet [Pharmacy Med Name: SILDENAFIL 20 MG TABLET] 15 tablet 3    Sig: TAKE 2-5 TABS AS NEEDED FOR SEXUAL INTERCOURSE *MAX PER INSURANCE*     Urology: Erectile Dysfunction Agents Passed - 01/29/2023 10:36 AM      Passed - AST in normal range and within 360 days    AST  Date Value Ref Range Status  08/31/2022 18 10 - 35 U/L Final         Passed - ALT in normal range and within 360 days    ALT  Date Value Ref Range Status  08/31/2022 24 9 - 46 U/L Final         Passed - Last BP in normal range    BP Readings from Last 1 Encounters:  08/31/22 128/84         Passed - Valid encounter within last 12 months    Recent Outpatient Visits           5 months ago Prediabetes   [facility] [place], Coralie Keens, NP   9 months ago Generalized abdominal pain   [facility] [place], Coralie Keens, NP   11 months ago Encounter for general adult medical examination with abnormal findings   [facility] [place], Coralie Keens, NP

## 2023-02-01 ENCOUNTER — Encounter: Payer: Self-pay | Admitting: Internal Medicine

## 2023-02-01 ENCOUNTER — Ambulatory Visit (INDEPENDENT_AMBULATORY_CARE_PROVIDER_SITE_OTHER): Payer: 59 | Admitting: Internal Medicine

## 2023-02-01 VITALS — BP 132/86 | HR 71 | Temp 96.6°F | Wt 303.0 lb

## 2023-02-01 DIAGNOSIS — H6123 Impacted cerumen, bilateral: Secondary | ICD-10-CM

## 2023-02-01 NOTE — Progress Notes (Signed)
Subjective:    Patient ID: Kenneth Sherman, male    DOB: 1971/09/15, 52 y.o.   MRN: [id number]  HPI  Patient presents to clinic today with complaint of muffled hearing.  This started 1 week ago.  He reports it is mainly in his left ear.  He denies runny nose, nasal congestion, sore throat or cough.  He denies fever, chills or bodyaches.  He has tried cleaning his ear out with a Q-tip with minimal results.  He has not tried anything OTC for this.  Review of Systems     Past Medical History:  Diagnosis Date   Arthritis    Coronary atherosclerosis due to calcified coronary lesion    GERD (gastroesophageal reflux disease)    Heartburn    HLD (hyperlipidemia)    Hypertension    no meds   Hypogonadism in male    Right knee DJD    Thyroid disease     Current Outpatient Medications  Medication Sig Dispense Refill   sildenafil (REVATIO) 20 MG tablet TAKE 2-5 TABS AS NEEDED FOR SEXUAL INTERCOURSE *MAX PER INSURANCE* 15 tablet 3   atorvastatin (LIPITOR) 10 MG tablet TAKE 1 TABLET BY MOUTH EVERY DAY 90 tablet 1   ezetimibe (ZETIA) 10 MG tablet TAKE 1 TABLET BY MOUTH EVERY DAY 90 tablet 1   lisinopril-hydrochlorothiazide (ZESTORETIC) 10-12.5 MG tablet TAKE 1 TABLET BY MOUTH EVERY DAY 90 tablet 0   metoprolol succinate (TOPROL-XL) 25 MG 24 hr tablet TAKE 1 TABLET (25 MG TOTAL) BY MOUTH IN THE MORNING 90 tablet 0   nortriptyline (PAMELOR) 50 MG capsule TAKE 1 CAPSULE BY MOUTH EVERYDAY AT BEDTIME 90 capsule 1   omeprazole (PRILOSEC) 40 MG capsule TAKE 1 CAPSULE (40 MG TOTAL) BY MOUTH 2 (TWO) TIMES Sherman BEFORE A MEAL. 60 capsule 3   No current facility-administered medications for this visit.    No Known Allergies  Family History  Problem Relation Age of Onset   Heart attack Mother    Hypertension Mother    Hypertension Father    Heart attack Father    Hypertension Brother    Healthy Brother    Healthy Brother    Healthy Brother    Kidney disease Neg Hx    Prostate cancer Neg Hx     Bladder Cancer Neg Hx    Colon cancer Neg Hx     Social History   Socioeconomic History   Marital status: Married    Spouse name: Not on file   Number of children: Not on file   Years of education: 14   Highest education level: Not on file  Occupational History   Occupation: CAD Games developer: [employer]  Tobacco Use   Smoking status: Never   Smokeless tobacco: Never  Vaping Use   Vaping Use: Never used  Substance and Sexual Activity   Alcohol use: Yes    Alcohol/week: 2.0 standard drinks of alcohol    Types: 1 Glasses of wine, 1 Cans of beer per week    Comment: occasionally   Drug use: No   Sexual activity: Yes    Birth control/protection: Condom  Other Topics Concern   Not on file  Social History Narrative   Regular exercise-no   Caffeine Use-yes   Social Determinants of Health   Financial Resource Strain: Not on file  Food Insecurity: Not on file  Transportation Needs: Not on file  Physical Activity: Not on file  Stress: Not on file  Social  Connections: Not on file  Intimate Partner Violence: Not on file     Constitutional: Denies fever, malaise, fatigue, headache or abrupt weight changes.  HEENT: Patient reports decreased hearing.  Denies eye pain, eye redness, ear pain, ringing in the ears, wax buildup, runny nose, nasal congestion, bloody nose, or sore throat. Respiratory: Denies difficulty breathing, shortness of breath, cough or sputum production.   Cardiovascular: Denies chest pain, chest tightness, palpitations or swelling in the hands or feet.  Neurological: Denies dizziness, difficulty with memory, difficulty with speech or problems with balance and coordination.   No other specific complaints in a complete review of systems (except as listed in HPI above).  Objective:   Physical Exam  BP 132/86 (BP Location: Right Arm, Patient Position: Sitting, Cuff Size: Large)   Pulse 71   Temp (!) 96.6 F (35.9 C) (Temporal)   Wt (!) 303 lb  (137.4 kg)   SpO2 99%   BMI 42.26 kg/m   Wt Readings from Last 3 Encounters:  08/31/22 (!) 303 lb (137.4 kg)  05/24/22 285 lb (129.3 kg)  05/17/22 290 lb 4 oz (131.7 kg)    General: Appears his stated age, obese, in NAD. Skin: Warm, dry and intact. No rashes, lesions or ulcerations noted. HEENT: Head: normal shape and size; Eyes: sclera white, no icterus, conjunctiva pink, PERRLA and EOMs intact; Ears: Bilateral cerumen impaction;  Cardiovascular: Normal rate and rhythm.  Pulmonary/Chest: Normal effort and positive vesicular breath sounds.  Neurological: Alert and oriented. Coordination normal.      BMET    Component Value Date/Time   NA 138 08/31/2022 1516   K 3.6 08/31/2022 1516   CL 100 08/31/2022 1516   CO2 28 08/31/2022 1516   GLUCOSE 128 (H) 08/31/2022 1516   BUN 12 08/31/2022 1516   CREATININE 1.15 08/31/2022 1516   CALCIUM 8.8 08/31/2022 1516   GFRNONAA >60 06/11/2018 1018   GFRAA >60 06/11/2018 1018    Lipid Panel     Component Value Date/Time   CHOL 142 08/31/2022 1516   TRIG 177 (H) 08/31/2022 1516   HDL 43 08/31/2022 1516   CHOLHDL 3.3 08/31/2022 1516   VLDL 19.8 03/26/2020 0849   LDLCALC 74 08/31/2022 1516    CBC    Component Value Date/Time   WBC 8.6 08/31/2022 1516   RBC 5.03 08/31/2022 1516   HGB 14.0 08/31/2022 1516   HGB 14.8 12/07/2017 0847   HCT 40.6 08/31/2022 1516   HCT 43.4 12/07/2017 0847   PLT 292 08/31/2022 1516   MCV 80.7 08/31/2022 1516   MCH 27.8 08/31/2022 1516   MCHC 34.5 08/31/2022 1516   RDW 13.5 08/31/2022 1516   LYMPHSABS 2.2 12/30/2020 1600   MONOABS 0.5 12/30/2020 1600   EOSABS 0.3 12/30/2020 1600   BASOSABS 0.1 12/30/2020 1600    Hgb A1C Lab Results  Component Value Date   HGBA1C 6.4 (H) 08/31/2022           Assessment & Plan:   Hearing Loss secondary to Bilateral Cerumen Impaction:  Manual lavage by CMA Encouraged Debrox 2 times weekly to prevent wax buildup  RTC in 1 month for your annual  exam Webb Silversmith, NP

## 2023-02-01 NOTE — Patient Instructions (Signed)

## 2023-02-06 ENCOUNTER — Encounter: Payer: Self-pay | Admitting: Urology

## 2023-02-06 ENCOUNTER — Ambulatory Visit (INDEPENDENT_AMBULATORY_CARE_PROVIDER_SITE_OTHER): Payer: 59 | Admitting: Urology

## 2023-02-06 VITALS — BP 144/86 | HR 105 | Ht 71.0 in | Wt 303.0 lb

## 2023-02-06 DIAGNOSIS — Z3009 Encounter for other general counseling and advice on contraception: Secondary | ICD-10-CM | POA: Diagnosis not present

## 2023-02-06 MED ORDER — DIAZEPAM 10 MG PO TABS: 10.0000 mg | ORAL_TABLET | Freq: Once | ORAL | 0 refills | Status: DC | PRN

## 2023-02-06 NOTE — Patient Instructions (Addendum)

## 2023-02-06 NOTE — Progress Notes (Signed)
02/06/23 2:37 PM   Kenneth Sherman Jun 21, 1971 [id number]  CC: Discuss vasectomy  HPI: 51 year old male interested in vasectomy for permanent sterilization.  He has 2 children ages 28 and 44 and he and his wife do not desire any further biologic pregnancies.  He uses sildenafil on demand with good results for ED.   PMH: Past Medical History:  Diagnosis Date   Arthritis    Coronary atherosclerosis due to calcified coronary lesion    GERD (gastroesophageal reflux disease)    Heartburn    HLD (hyperlipidemia)    Hypertension    no meds   Hypogonadism in male    Right knee DJD    Thyroid disease     Surgical History: Past Surgical History:  Procedure Laterality Date   BICEPS TENDON REPAIR  05/2015   COLONOSCOPY WITH PROPOFOL N/A 04/01/2021   Procedure: COLONOSCOPY WITH PROPOFOL;  Surgeon: Virgel Manifold, MD;  Location: [facility] ENDOSCOPY;  Service: Endoscopy;  Laterality: N/A;   ESOPHAGOGASTRODUODENOSCOPY (EGD) WITH PROPOFOL N/A 05/24/2022   Procedure: ESOPHAGOGASTRODUODENOSCOPY (EGD) WITH PROPOFOL;  Surgeon: Lin Landsman, MD;  Location: [facility] ENDOSCOPY;  Service: Gastroenterology;  Laterality: N/A;   KNEE ARTHROSCOPY W/ MENISCECTOMY  2008   left   KNEE ARTHROSCOPY W/ MENISCECTOMY  2011   right   PARTIAL KNEE ARTHROPLASTY  11/29/2012   Procedure: UNICOMPARTMENTAL KNEE;  Surgeon: Lorn Junes, MD;  Location: [facility];  Service: Orthopedics;  Laterality: Right;      Family History: Family History  Problem Relation Age of Onset   Heart attack Mother    Hypertension Mother    Hypertension Father    Heart attack Father    Hypertension Brother    Healthy Brother    Healthy Brother    Healthy Brother    Kidney disease Neg Hx    Prostate cancer Neg Hx    Bladder Cancer Neg Hx    Colon cancer Neg Hx     Social History:  reports that he has never smoked. He has never used smokeless tobacco. He reports current alcohol use of about 2.0 standard drinks of alcohol per  week. He reports that he does not use drugs.  Physical Exam: BP (!) 144/86 (BP Location: Left Arm, Patient Position: Sitting, Cuff Size: Large)   Pulse (!) 105   Ht '5\' 11"'$  (1.803 m)   Wt (!) 303 lb (137.4 kg)   BMI 42.26 kg/m    Constitutional:  Alert and oriented, No acute distress. Cardiovascular: No clubbing, cyanosis, or edema. Respiratory: Normal respiratory effort, no increased work of breathing. GI: Abdomen is soft, nontender, nondistended, no abdominal masses GU: Testicles 20 cc and descended bilaterally without masses, cords thickened bilaterally but vas deferens palpable, right easier to palpate than left  Assessment & Plan:   52 year old male interested in vasectomy for permanent sterilization.  We discussed the risks and benefits of vasectomy at length.  Vasectomy is intended to be a permanent form of contraception, and does not produce immediate sterility.  Following vasectomy another form of contraception is required until vas occlusion is confirmed by a post-vasectomy semen analysis obtained 2-3 months after the procedure.  Even after vas occlusion is confirmed, vasectomy is not 100% reliable in preventing pregnancy, and the failure rate is approximately 12/1998.  Repeat vasectomy is required in less than 1% of patients.  He should refrain from ejaculation for 1 week after vasectomy.  Options for fertility after vasectomy include vasectomy reversal, and sperm retrieval with in vitro fertilization  or ICSI.  These options are not always successful and may be expensive.  Finally, there are other permanent and non-permanent alternatives to vasectomy available. There is no risk of erectile dysfunction, and the volume of semen will be similar to prior, as the majority of the ejaculate is from the prostate and seminal vesicles.   The procedure takes ~20 minutes.  We recommend patients take 5-10 mg of Valium 30 minutes prior, and he will need a driver post-procedure.  Local anesthetic is  injected into the scrotal skin and a small segment of the vas deferens is removed, and the ends occluded. The complication rate is approximately 1-2%, and includes bleeding, infection, and development of chronic scrotal pain.  PLAN: Schedule vasectomy Valium sent to pharmacy    Nickolas Madrid, MD 02/06/2023  [facility] [address] [place],  [address] [phone]

## 2023-02-16 ENCOUNTER — Other Ambulatory Visit: Payer: Self-pay | Admitting: Internal Medicine

## 2023-02-16 NOTE — Telephone Encounter (Signed)
Last reordered 10/13/22 #90 1 RF too soon  Requested Prescriptions  Refused Prescriptions Disp Refills   ezetimibe (ZETIA) 10 MG tablet [Pharmacy Med Name: EZETIMIBE 10 MG TABLET] 90 tablet 1    Sig: TAKE 1 TABLET BY MOUTH EVERY DAY     Cardiovascular:  Antilipid - Sterol Transport Inhibitors Failed - 02/16/2023  8:15 AM      Failed - Lipid Panel in normal range within the last 12 months    Cholesterol  Date Value Ref Range Status  08/31/2022 142 <200 mg/dL Final   LDL Cholesterol (Calc)  Date Value Ref Range Status  08/31/2022 74 mg/dL (calc) Final    Comment:    Reference range: <100 . Desirable range <100 mg/dL for primary prevention;   <70 mg/dL for patients with CHD or diabetic patients  with > or = 2 CHD risk factors. Marland Kitchen LDL-C is now calculated using the Martin-Hopkins  calculation, which is a validated novel method providing  better accuracy than the Friedewald equation in the  estimation of LDL-C.  Cresenciano Genre et al. Annamaria Helling. [id number]): 2061-2068  (http://education.[employer].com/faq/FAQ164)    Direct LDL  Date Value Ref Range Status  06/04/2018 157.0 mg/dL Final    Comment:    Optimal:  <100 mg/dLNear or Above Optimal:  100-129 mg/dLBorderline High:  130-159 mg/dLHigh:  160-189 mg/dLVery High:  >190 mg/dL   HDL  Date Value Ref Range Status  08/31/2022 43 > OR = 40 mg/dL Final   Triglycerides  Date Value Ref Range Status  08/31/2022 177 (H) <150 mg/dL Final         Passed - AST in normal range and within 360 days    AST  Date Value Ref Range Status  08/31/2022 18 10 - 35 U/L Final         Passed - ALT in normal range and within 360 days    ALT  Date Value Ref Range Status  08/31/2022 24 9 - 46 U/L Final         Passed - Patient is not pregnant      Passed - Valid encounter within last 12 months    Recent Outpatient Visits           2 weeks ago Hearing loss of both ears due to cerumen impaction   [place],  Coralie Keens, NP   5 months ago Prediabetes   [facility] [place], Coralie Keens, NP   10 months ago Generalized abdominal pain   [facility] [place], Coralie Keens, NP   11 months ago Encounter for general adult medical examination with abnormal findings   [facility] [place], Coralie Keens, NP       Future Appointments             In 2 months Vanga, Tally Due, MD [place] [facility] at [facility]

## 2023-02-22 ENCOUNTER — Other Ambulatory Visit: Payer: Self-pay | Admitting: Internal Medicine

## 2023-02-22 NOTE — Telephone Encounter (Signed)
Requested Prescriptions  Pending Prescriptions Disp Refills   nortriptyline (PAMELOR) 50 MG capsule [Pharmacy Med Name: NORTRIPTYLINE HCL 50 MG CAP] 90 capsule 0    Sig: TAKE 1 CAPSULE BY MOUTH EVERYDAY AT BEDTIME     Psychiatry:  Antidepressants - Heterocyclics (TCAs) Passed - 02/22/2023 12:34 PM      Passed - Valid encounter within last 6 months    Recent Outpatient Visits           3 weeks ago Hearing loss of both ears due to cerumen impaction   [facility] [place], Coralie Keens, NP   5 months ago [facility] [place], Coralie Keens, NP   10 months ago Generalized abdominal pain   [facility] [place], Coralie Keens, NP   12 months ago Encounter for general adult medical examination with abnormal findings   [facility] [place], Coralie Keens, NP       Future Appointments             In 2 months Vanga, Tally Due, MD [place] [facility] at [facility]

## 2023-03-06 ENCOUNTER — Ambulatory Visit (INDEPENDENT_AMBULATORY_CARE_PROVIDER_SITE_OTHER): Payer: 59 | Admitting: Urology

## 2023-03-06 ENCOUNTER — Encounter: Payer: Self-pay | Admitting: Urology

## 2023-03-06 VITALS — BP 154/95 | HR 87 | Ht 71.0 in | Wt 303.0 lb

## 2023-03-06 DIAGNOSIS — Z302 Encounter for sterilization: Secondary | ICD-10-CM | POA: Diagnosis not present

## 2023-03-06 MED ORDER — TADALAFIL 10 MG PO TABS: 10.0000 mg | ORAL_TABLET | Freq: Every day | ORAL | 11 refills | Status: DC | PRN

## 2023-03-06 NOTE — Patient Instructions (Signed)

## 2023-03-06 NOTE — Progress Notes (Signed)
VASECTOMY PROCEDURE NOTE:  The patient was taken to the minor procedure room and placed in the supine position. His genitals were prepped and draped in the usual sterile fashion. The right vas deferens was brought up to the skin of the right upper scrotum. The skin overlying it was anesthetized with 1% lidocaine without epinephrine, anesthetic was also injected alongside the vas deferens in the direction of the inguinal canal. The no scalpel vasectomy instrument was used to make a small perforation in the scrotal skin. The vasectomy clamp was used to grasp the vas deferens. It was carefully dissected free from surrounding structures. A 1cm segment of the vas was removed, and the cut ends of the mucosa were cauterized. No significant bleeding was noted. The vas deferens was returned to the scrotum. The skin incision was closed with a simple interrupted stitch of 4-0 chromic.  Attention was then turned to the left side. The left vasectomy was performed in the same exact fashion. Sterile dressings were placed over each incision. The patient tolerated the procedure well.  IMPRESSION/DIAGNOSIS: The patient is a 52 year old gentleman who underwent a vasectomy today. Post-procedure instructions were reviewed. I stressed the importance of continuing to use birth control until he provides a semen specimen more than 2 months from now that demonstrates azoospermia.  We discussed return precautions including fever over 101, significant bleeding or hematoma, or uncontrolled pain. I also stressed the importance of avoiding strenuous activity for one week, no sexual activity or ejaculations for 5 days, intermittent icing over the next 48 hours, and scrotal support.   PLAN: The patient will be advised of his semen analysis results when available. Trial of cialis 10-20mg  PRN to replace sildenafil  Nickolas Madrid, MD 03/06/2023

## 2023-03-27 ENCOUNTER — Other Ambulatory Visit: Payer: Self-pay | Admitting: Internal Medicine

## 2023-03-27 ENCOUNTER — Other Ambulatory Visit: Payer: Self-pay | Admitting: Cardiology

## 2023-03-27 DIAGNOSIS — I10 Essential (primary) hypertension: Secondary | ICD-10-CM

## 2023-03-27 DIAGNOSIS — Z0000 Encounter for general adult medical examination without abnormal findings: Secondary | ICD-10-CM

## 2023-03-27 DIAGNOSIS — R0609 Other forms of dyspnea: Secondary | ICD-10-CM

## 2023-03-27 DIAGNOSIS — Z01810 Encounter for preprocedural cardiovascular examination: Secondary | ICD-10-CM

## 2023-03-28 ENCOUNTER — Encounter: Payer: Self-pay | Admitting: Internal Medicine

## 2023-03-28 NOTE — Telephone Encounter (Signed)
Requested medication (s) are due for refill today: Yes  Requested medication (s) are on the active medication list: Yes  Last refill:  01/04/23  Future visit scheduled: No  Notes to clinic:  Protocol indicates pt. Needs lab work.    Requested Prescriptions  Pending Prescriptions Disp Refills   lisinopril-hydrochlorothiazide (ZESTORETIC) 10-12.5 MG tablet [Pharmacy Med Name: LISINOPRIL-HCTZ 10-12.5 MG TAB] 90 tablet 0    Sig: TAKE 1 TABLET BY MOUTH EVERY DAY     Cardiovascular:  ACEI + Diuretic Combos Failed - 03/27/2023 11:32 AM      Failed - Na in normal range and within 180 days    Sodium  Date Value Ref Range Status  08/31/2022 138 135 - 146 mmol/L Final         Failed - K in normal range and within 180 days    Potassium  Date Value Ref Range Status  08/31/2022 3.6 3.5 - 5.3 mmol/L Final         Failed - Cr in normal range and within 180 days    Creat  Date Value Ref Range Status  08/31/2022 1.15 0.70 - 1.30 mg/dL Final         Failed - eGFR is 30 or above and within 180 days    GFR calc Af Amer  Date Value Ref Range Status  06/11/2018 >60 >60 mL/min Final    Comment:    (NOTE) The eGFR has been calculated using the CKD EPI equation. This calculation has not been validated in all clinical situations. eGFR's persistently <60 mL/min signify possible Chronic Kidney Disease.    GFR calc non Af Amer  Date Value Ref Range Status  06/11/2018 >60 >60 mL/min Final   GFR  Date Value Ref Range Status  12/30/2020 74.24 >60.00 mL/min Final    Comment:    Calculated using the CKD-EPI Creatinine Equation (2021)   eGFR  Date Value Ref Range Status  08/31/2022 77 > OR = 60 mL/min/1.24m2 Final         Failed - Last BP in normal range    BP Readings from Last 1 Encounters:  03/06/23 (!) 154/95         Passed - Patient is not pregnant      Passed - Valid encounter within last 6 months    Recent Outpatient Visits           1 month ago Hearing loss of both ears  due to cerumen impaction   [facility] [place], Salvadore Oxford, NP   6 months ago Prediabetes   [place] [facility] [place], Salvadore Oxford, NP   11 months ago Generalized abdominal pain   [facility] [place], Salvadore Oxford, NP   1 year ago Encounter for general adult medical examination with abnormal findings   [facility] [place], Salvadore Oxford, NP       Future Appointments             In 1 month Vanga, Loel Dubonnet, MD [facility] [place] [facility] at [facility]

## 2023-04-03 ENCOUNTER — Encounter: Payer: Self-pay | Admitting: Gastroenterology

## 2023-04-04 NOTE — Telephone Encounter (Signed)
Submitted PA through cover my meds. Waiting on response from insurance company  

## 2023-04-06 ENCOUNTER — Encounter: Payer: Self-pay | Admitting: Internal Medicine

## 2023-04-06 ENCOUNTER — Ambulatory Visit (INDEPENDENT_AMBULATORY_CARE_PROVIDER_SITE_OTHER): Payer: Managed Care, Other (non HMO) | Admitting: Internal Medicine

## 2023-04-06 VITALS — BP 122/72 | HR 103 | Wt 305.8 lb

## 2023-04-06 DIAGNOSIS — Z6841 Body Mass Index (BMI) 40.0 and over, adult: Secondary | ICD-10-CM

## 2023-04-06 DIAGNOSIS — R7303 Prediabetes: Secondary | ICD-10-CM | POA: Diagnosis not present

## 2023-04-06 DIAGNOSIS — E7800 Pure hypercholesterolemia, unspecified: Secondary | ICD-10-CM

## 2023-04-06 DIAGNOSIS — Z0001 Encounter for general adult medical examination with abnormal findings: Secondary | ICD-10-CM | POA: Diagnosis not present

## 2023-04-06 DIAGNOSIS — Z125 Encounter for screening for malignant neoplasm of prostate: Secondary | ICD-10-CM | POA: Diagnosis not present

## 2023-04-06 LAB — CBC
MCHC: 34 g/dL (ref 32.0–36.0)
MCV: 80 fL (ref 80.0–100.0)

## 2023-04-06 NOTE — Progress Notes (Signed)
Subjective:    Patient ID: Kenneth Sherman, male    DOB: 05/11/1971, 52 y.o.   MRN: [id number]  HPI  Patient presents to clinic today for his annual exam.  Flu: Never Tetanus: 11/2012 COVID: Pfizer x 3 Shingrix: Never PSA screening: 11/2017 Colon screening: 03/2021 Vision screening: annually Dentist: biannually  Diet: He does eat meat. He consumes fruits and veggies. He does eat some fried foods. He drinks mostly water, sweet tea. Exercise: None  Review of Systems     Past Medical History:  Diagnosis Date   Arthritis    Coronary atherosclerosis due to calcified coronary lesion    GERD (gastroesophageal reflux disease)    Heartburn    HLD (hyperlipidemia)    Hypertension    no meds   Hypogonadism in male    Right knee DJD    Thyroid disease     Current Outpatient Medications  Medication Sig Dispense Refill   atorvastatin (LIPITOR) 10 MG tablet TAKE 1 TABLET BY MOUTH EVERY DAY 90 tablet 1   celecoxib (CELEBREX) 200 MG capsule Take 200 mg by mouth daily.     diazepam (VALIUM) 10 MG tablet Take 1 tablet (10 mg total) by mouth once as needed for up to 1 dose for anxiety (take 45 minutes prior to vasectomy). 1 tablet 0   ezetimibe (ZETIA) 10 MG tablet TAKE 1 TABLET BY MOUTH EVERY DAY 90 tablet 1   lisinopril-hydrochlorothiazide (ZESTORETIC) 10-12.5 MG tablet TAKE 1 TABLET BY MOUTH EVERY DAY 90 tablet 0   metoprolol succinate (TOPROL-XL) 25 MG 24 hr tablet TAKE 1 TABLET (25 MG TOTAL) BY MOUTH IN THE MORNING 90 tablet 0   nortriptyline (PAMELOR) 50 MG capsule TAKE 1 CAPSULE BY MOUTH EVERYDAY AT BEDTIME 90 capsule 0   omeprazole (PRILOSEC) 40 MG capsule TAKE 1 CAPSULE (40 MG TOTAL) BY MOUTH 2 (TWO) TIMES DAILY BEFORE A MEAL. 60 capsule 3   sildenafil (REVATIO) 20 MG tablet TAKE 2-5 TABS AS NEEDED FOR SEXUAL INTERCOURSE *MAX PER INSURANCE* 15 tablet 3   tadalafil (CIALIS) 10 MG tablet Take 1-2 tablets (10-20 mg total) by mouth daily as needed for erectile dysfunction (take one  hour prior to sexual activity). 30 tablet 11   No current facility-administered medications for this visit.    No Known Allergies  Family History  Problem Relation Age of Onset   Heart attack Mother    Hypertension Mother    Hypertension Father    Heart attack Father    Hypertension Brother    Healthy Brother    Healthy Brother    Healthy Brother    Kidney disease Neg Hx    Prostate cancer Neg Hx    Bladder Cancer Neg Hx    Colon cancer Neg Hx     Social History   Socioeconomic History   Marital status: Married    Spouse name: Not on file   Number of children: Not on file   Years of education: 14   Highest education level: Associate degree: occupational, Scientist, product/process development, or vocational program  Occupational History   Occupation: CAD Copywriter, advertising: [employer]  Tobacco Use   Smoking status: Never   Smokeless tobacco: Never  Vaping Use   Vaping Use: Never used  Substance and Sexual Activity   Alcohol use: Yes    Alcohol/week: 2.0 standard drinks of alcohol    Types: 1 Glasses of wine, 1 Cans of beer per week    Comment: occasionally   Drug  use: No   Sexual activity: Yes    Birth control/protection: Condom  Other Topics Concern   Not on file  Social History Narrative   Regular exercise-no   Caffeine Use-yes   Social Determinants of Health   Financial Resource Strain: Low Risk  (04/03/2023)   Overall Financial Resource Strain (CARDIA)    Difficulty of Paying Living Expenses: Not hard at all  Food Insecurity: No Food Insecurity (04/03/2023)   Hunger Vital Sign    Worried About Running Out of Food in the Last Year: Never true    Ran Out of Food in the Last Year: Never true  Transportation Needs: No Transportation Needs (04/03/2023)   PRAPARE - Administrator, Civil Service (Medical): No    Lack of Transportation (Non-Medical): No  Physical Activity: Insufficiently Active (04/03/2023)   Exercise Vital Sign    Days of Exercise per Week: 1 day     Minutes of Exercise per Session: 30 min  Stress: No Stress Concern Present (04/03/2023)   [employer] of Occupational Health - Occupational Stress Questionnaire    Feeling of Stress : Only a little  Social Connections: Unknown (04/03/2023)   Social Connection and Isolation Panel [NHANES]    Frequency of Communication with Friends and Family: More than three times a week    Frequency of Social Gatherings with Friends and Family: Once a week    Attends Religious Services: Patient declined    Database administrator or Organizations: No    Attends Engineer, structural: Not on file    Marital Status: Married  Catering manager Violence: Not on file     Constitutional: Patient reports intermittent headaches.  Denies fever, malaise, fatigue, or abrupt weight changes.  HEENT: Denies eye pain, eye redness, ear pain, ringing in the ears, wax buildup, runny nose, nasal congestion, bloody nose, or sore throat. Respiratory: Denies difficulty breathing, shortness of breath, cough or sputum production.   Cardiovascular: Denies chest pain, chest tightness, palpitations or swelling in the hands or feet.  Gastrointestinal: Denies abdominal pain, bloating, constipation, diarrhea or blood in the stool.  GU: Patient reports erectile dysfunction.  Denies urgency, frequency, pain with urination, burning sensation, blood in urine, odor or discharge. Musculoskeletal: Patient reports joint pain in knees.  Denies decrease in range of motion, difficulty with gait, muscle pain or joint swelling.  Skin: Denies redness, rashes, lesions or ulcercations.  Neurological: Denies dizziness, difficulty with memory, difficulty with speech or problems with balance and coordination.  Psych: Patient has a history of anxiety.  Denies depression, SI/HI.  No other specific complaints in a complete review of systems (except as listed in HPI above).  Objective:   Physical Exam  BP 122/72   Pulse (!) 103   Wt (!) 305  lb 12.8 oz (138.7 kg)   SpO2 97%   BMI 42.65 kg/m    Wt Readings from Last 3 Encounters:  03/06/23 (!) 303 lb (137.4 kg)  02/06/23 (!) 303 lb (137.4 kg)  02/01/23 (!) 303 lb (137.4 kg)    General: Appears his stated age, obese in NAD. Skin: Warm, dry and intact.  HEENT: Head: normal shape and size; Eyes: sclera white, no icterus, conjunctiva pink, PERRLA and EOMs intact;  Neck:  Neck supple, trachea midline. No masses, lumps or thyromegaly present.  Cardiovascular: Normal rate and rhythm. S1,S2 noted.  No murmur, rubs or gallops noted. No JVD. Trace BLE edema. No carotid bruits noted. Pulmonary/Chest: Normal effort and positive vesicular  breath sounds. No respiratory distress. No wheezes, rales or ronchi noted.  Abdomen: Normal bowel sounds.  Musculoskeletal: Strength 5/5 BUE/BLE.  No difficulty with gait.  Neurological: Alert and oriented. Cranial nerves II-XII grossly intact. Coordination normal.  Psychiatric: Mood and affect normal. Behavior is normal. Judgment and thought content normal.    BMET    Component Value Date/Time   NA 138 08/31/2022 1516   K 3.6 08/31/2022 1516   CL 100 08/31/2022 1516   CO2 28 08/31/2022 1516   GLUCOSE 128 (H) 08/31/2022 1516   BUN 12 08/31/2022 1516   CREATININE 1.15 08/31/2022 1516   CALCIUM 8.8 08/31/2022 1516   GFRNONAA >60 06/11/2018 1018   GFRAA >60 06/11/2018 1018    Lipid Panel     Component Value Date/Time   CHOL 142 08/31/2022 1516   TRIG 177 (H) 08/31/2022 1516   HDL 43 08/31/2022 1516   CHOLHDL 3.3 08/31/2022 1516   VLDL 19.8 03/26/2020 0849   LDLCALC 74 08/31/2022 1516    CBC    Component Value Date/Time   WBC 8.6 08/31/2022 1516   RBC 5.03 08/31/2022 1516   HGB 14.0 08/31/2022 1516   HGB 14.8 12/07/2017 0847   HCT 40.6 08/31/2022 1516   HCT 43.4 12/07/2017 0847   PLT 292 08/31/2022 1516   MCV 80.7 08/31/2022 1516   MCH 27.8 08/31/2022 1516   MCHC 34.5 08/31/2022 1516   RDW 13.5 08/31/2022 1516   LYMPHSABS  2.2 12/30/2020 1600   MONOABS 0.5 12/30/2020 1600   EOSABS 0.3 12/30/2020 1600   BASOSABS 0.1 12/30/2020 1600    Hgb A1C Lab Results  Component Value Date   HGBA1C 6.4 (H) 08/31/2022           Assessment & Plan:   Preventative Health Maintenance:  Encouraged him to get a flu shot in the fall Tetanus declined COVID-vaccine UTD Discussed Shingrix vaccine, he will check coverage with his insurance company and schedule visit if he would like to have this done Colon screening UTD Encouraged him to consume a balanced diet and exercise regimen Advised him to see an eye doctor and dentist annually Will check CBC, c-Met, lipid, A1c and PSA today  RTC in 6 months, follow-up chronic conditions Nicki Reaper, NP

## 2023-04-06 NOTE — Assessment & Plan Note (Signed)
Encourage diet and exercise for weight loss 

## 2023-04-06 NOTE — Patient Instructions (Signed)
Health Maintenance, Male Adopting a healthy lifestyle and getting preventive care are important in promoting health and wellness. Ask your health care provider about: The right schedule for you to have regular tests and exams. Things you can do on your own to prevent diseases and keep yourself healthy. What should I know about diet, weight, and exercise? Eat a healthy diet  Eat a diet that includes plenty of vegetables, fruits, low-fat dairy products, and lean protein. Do not eat a lot of foods that are high in solid fats, added sugars, or sodium. Maintain a healthy weight Body mass index (BMI) is a measurement that can be used to identify possible weight problems. It estimates body fat based on height and weight. Your health care provider can help determine your BMI and help you achieve or maintain a healthy weight. Get regular exercise Get regular exercise. This is one of the most important things you can do for your health. Most adults should: Exercise for at least 150 minutes each week. The exercise should increase your heart rate and make you sweat (moderate-intensity exercise). Do strengthening exercises at least twice a week. This is in addition to the moderate-intensity exercise. Spend less time sitting. Even light physical activity can be beneficial. Watch cholesterol and blood lipids Have your blood tested for lipids and cholesterol at 53 years of age, then have this test every 5 years. You may need to have your cholesterol levels checked more often if: Your lipid or cholesterol levels are high. You are older than 53 years of age. You are at high risk for heart disease. What should I know about cancer screening? Many types of cancers can be detected early and may often be prevented. Depending on your health history and family history, you may need to have cancer screening at various ages. This may include screening for: Colorectal cancer. Prostate cancer. Skin cancer. Lung  cancer. What should I know about heart disease, diabetes, and high blood pressure? Blood pressure and heart disease High blood pressure causes heart disease and increases the risk of stroke. This is more likely to develop in people who have high blood pressure readings or are overweight. Talk with your health care provider about your target blood pressure readings. Have your blood pressure checked: Every 3-5 years if you are 18-39 years of age. Every year if you are 40 years old or older. If you are between the ages of 65 and 75 and are a current or former smoker, ask your health care provider if you should have a one-time screening for abdominal aortic aneurysm (AAA). Diabetes Have regular diabetes screenings. This checks your fasting blood sugar level. Have the screening done: Once every three years after age 45 if you are at a normal weight and have a low risk for diabetes. More often and at a younger age if you are overweight or have a high risk for diabetes. What should I know about preventing infection? Hepatitis B If you have a higher risk for hepatitis B, you should be screened for this virus. Talk with your health care provider to find out if you are at risk for hepatitis B infection. Hepatitis C Blood testing is recommended for: Everyone born from 1945 through 1965. Anyone with known risk factors for hepatitis C. Sexually transmitted infections (STIs) You should be screened each year for STIs, including gonorrhea and chlamydia, if: You are sexually active and are younger than 53 years of age. You are older than 53 years of age and your   health care provider tells you that you are at risk for this type of infection. Your sexual activity has changed since you were last screened, and you are at increased risk for chlamydia or gonorrhea. Ask your health care provider if you are at risk. Ask your health care provider about whether you are at high risk for HIV. Your health care provider  may recommend a prescription medicine to help prevent HIV infection. If you choose to take medicine to prevent HIV, you should first get tested for HIV. You should then be tested every 3 months for as long as you are taking the medicine. Follow these instructions at home: Alcohol use Do not drink alcohol if your health care provider tells you not to drink. If you drink alcohol: Limit how much you have to 0-2 drinks a day. Know how much alcohol is in your drink. In the [place] one drink equals one 12 oz bottle of beer (355 mL), one 5 oz glass of wine (148 mL), or one 1 oz glass of hard liquor (44 mL). Lifestyle Do not use any products that contain nicotine or tobacco. These products include cigarettes, chewing tobacco, and vaping devices, such as e-cigarettes. If you need help quitting, ask your health care provider. Do not use street drugs. Do not share needles. Ask your health care provider for help if you need support or information about quitting drugs. General instructions Schedule regular health, dental, and eye exams. Stay current with your vaccines. Tell your health care provider if: You often feel depressed. You have ever been abused or do not feel safe at home. Summary Adopting a healthy lifestyle and getting preventive care are important in promoting health and wellness. Follow your health care provider's instructions about healthy diet, exercising, and getting tested or screened for diseases. Follow your health care provider's instructions on monitoring your cholesterol and blood pressure. This information is not intended to replace advice given to you by your health care provider. Make sure you discuss any questions you have with your health care provider. Document Revised: 04/18/2021 Document Reviewed: 04/18/2021 Elsevier Patient Education  2023 Elsevier Inc.  

## 2023-04-07 LAB — COMPLETE METABOLIC PANEL WITH GFR
AG Ratio: 1.7 (calc) (ref 1.0–2.5)
ALT: 23 U/L (ref 9–46)
AST: 15 U/L (ref 10–35)
Albumin: 4.6 g/dL (ref 3.6–5.1)
Alkaline phosphatase (APISO): 93 U/L (ref 35–144)
BUN: 13 mg/dL (ref 7–25)
CO2: 30 mmol/L (ref 20–32)
Calcium: 9.4 mg/dL (ref 8.6–10.3)
Chloride: 100 mmol/L (ref 98–110)
Creat: 1.06 mg/dL (ref 0.70–1.30)
Globulin: 2.7 g/dL (calc) (ref 1.9–3.7)
Glucose, Bld: 117 mg/dL (ref 65–139)
Potassium: 4.3 mmol/L (ref 3.5–5.3)
Sodium: 140 mmol/L (ref 135–146)
Total Bilirubin: 0.3 mg/dL (ref 0.2–1.2)
Total Protein: 7.3 g/dL (ref 6.1–8.1)
eGFR: 84 mL/min/{1.73_m2} (ref >=60)

## 2023-04-07 LAB — PSA: PSA: 1.53 ng/mL (ref <=4.00)

## 2023-04-07 LAB — CBC
HCT: 40.3 % (ref 38.5–50.0)
Hemoglobin: 13.7 g/dL (ref 13.2–17.1)
MCH: 27.2 pg (ref 27.0–33.0)
MPV: 8.8 fL (ref 7.5–12.5)
Platelets: 301 10*3/uL (ref 140–400)
RBC: 5.04 10*6/uL (ref 4.20–5.80)
RDW: 13.9 % (ref 11.0–15.0)
WBC: 9.2 10*3/uL (ref 3.8–10.8)

## 2023-04-07 LAB — LIPID PANEL
Cholesterol: 159 mg/dL (ref <200)
HDL: 44 mg/dL (ref >=40)
LDL Cholesterol (Calc): 90 mg/dL (calc)
Non-HDL Cholesterol (Calc): 115 mg/dL (calc) (ref <130)
Total CHOL/HDL Ratio: 3.6 (calc) (ref <5.0)
Triglycerides: 155 mg/dL — ABNORMAL HIGH (ref <150)

## 2023-04-07 LAB — HEMOGLOBIN A1C
Hgb A1c MFr Bld: 6.8 % of total Hgb — ABNORMAL HIGH (ref <5.7)
Mean Plasma Glucose: 148 mg/dL
eAG (mmol/L): 8.2 mmol/L

## 2023-04-13 ENCOUNTER — Ambulatory Visit (INDEPENDENT_AMBULATORY_CARE_PROVIDER_SITE_OTHER): Payer: Managed Care, Other (non HMO) | Admitting: Internal Medicine

## 2023-04-13 ENCOUNTER — Encounter: Payer: Self-pay | Admitting: Internal Medicine

## 2023-04-13 VITALS — BP 108/68 | HR 97 | Temp 97.1°F | Wt 305.0 lb

## 2023-04-13 DIAGNOSIS — Z6841 Body Mass Index (BMI) 40.0 and over, adult: Secondary | ICD-10-CM

## 2023-04-13 DIAGNOSIS — E1165 Type 2 diabetes mellitus with hyperglycemia: Secondary | ICD-10-CM

## 2023-04-13 DIAGNOSIS — E1169 Type 2 diabetes mellitus with other specified complication: Secondary | ICD-10-CM

## 2023-04-13 DIAGNOSIS — E785 Hyperlipidemia, unspecified: Secondary | ICD-10-CM

## 2023-04-13 NOTE — Patient Instructions (Signed)

## 2023-04-13 NOTE — Assessment & Plan Note (Signed)
Discussed diabetes and standards of medical care Encouraged low carb diet and exercise for weight loss Will hold off on diabetes medication at this time Encouraged routine eye exam Encouraged routine foot exam He declines pneumovax today

## 2023-04-13 NOTE — Assessment & Plan Note (Signed)
Encouraged diet and exercise for weight loss ?

## 2023-04-13 NOTE — Assessment & Plan Note (Signed)
Encouraged low fat diet Continue Atorvastatin and Ezetimibe

## 2023-04-13 NOTE — Progress Notes (Signed)
Subjective:    Patient ID: Kenneth Sherman, male    DOB: 1971-12-04, 52 y.o.   MRN: [id number]  HPI  Patient presents to clinic today to discuss recent labs.  His recent A1c is 6.8% which indicates a new diagnosis of diabetes.  He has been prediabetic in the past.  His LDL was 90 with triglycerides of 155.  He is not taking any oral diabetic medication at this time but he is taking Atorvastatin and Ezetimibe.  Review of Systems     Past Medical History:  Diagnosis Date   Arthritis    Coronary atherosclerosis due to calcified coronary lesion    GERD (gastroesophageal reflux disease)    Heartburn    HLD (hyperlipidemia)    Hypertension    no meds   Hypogonadism in male    Right knee DJD    Thyroid disease     Current Outpatient Medications  Medication Sig Dispense Refill   atorvastatin (LIPITOR) 10 MG tablet TAKE 1 TABLET BY MOUTH EVERY DAY 90 tablet 1   celecoxib (CELEBREX) 200 MG capsule Take 200 mg by mouth daily.     ezetimibe (ZETIA) 10 MG tablet TAKE 1 TABLET BY MOUTH EVERY DAY 90 tablet 1   lisinopril-hydrochlorothiazide (ZESTORETIC) 10-12.5 MG tablet TAKE 1 TABLET BY MOUTH EVERY DAY 90 tablet 0   metoprolol succinate (TOPROL-XL) 25 MG 24 hr tablet TAKE 1 TABLET (25 MG TOTAL) BY MOUTH IN THE MORNING 90 tablet 0   nortriptyline (PAMELOR) 50 MG capsule TAKE 1 CAPSULE BY MOUTH EVERYDAY AT BEDTIME 90 capsule 0   omeprazole (PRILOSEC) 40 MG capsule TAKE 1 CAPSULE (40 MG TOTAL) BY MOUTH 2 (TWO) TIMES DAILY BEFORE A MEAL. 60 capsule 3   tadalafil (CIALIS) 10 MG tablet Take 1-2 tablets (10-20 mg total) by mouth daily as needed for erectile dysfunction (take one hour prior to sexual activity). 30 tablet 11   No current facility-administered medications for this visit.    No Known Allergies  Family History  Problem Relation Age of Onset   Heart attack Mother    Hypertension Mother    Hypertension Father    Heart attack Father    Hypertension Brother    Healthy Brother     Healthy Brother    Healthy Brother    Kidney disease Neg Hx    Prostate cancer Neg Hx    Bladder Cancer Neg Hx    Colon cancer Neg Hx     Social History   Socioeconomic History   Marital status: Married    Spouse name: Not on file   Number of children: Not on file   Years of education: 14   Highest education level: Associate degree: occupational, Scientist, product/process development, or vocational program  Occupational History   Occupation: CAD Copywriter, advertising: [employer]  Tobacco Use   Smoking status: Never   Smokeless tobacco: Never  Vaping Use   Vaping Use: Never used  Substance and Sexual Activity   Alcohol use: Yes    Alcohol/week: 2.0 standard drinks of alcohol    Types: 1 Glasses of wine, 1 Cans of beer per week    Comment: occasionally   Drug use: No   Sexual activity: Yes    Birth control/protection: Condom  Other Topics Concern   Not on file  Social History Narrative   Regular exercise-no   Caffeine Use-yes   Social Determinants of Health   Financial Resource Strain: Low Risk  (04/03/2023)   Overall Financial  Resource Strain (CARDIA)    Difficulty of Paying Living Expenses: Not hard at all  Food Insecurity: No Food Insecurity (04/03/2023)   Hunger Vital Sign    Worried About Running Out of Food in the Last Year: Never true    Ran Out of Food in the Last Year: Never true  Transportation Needs: No Transportation Needs (04/03/2023)   PRAPARE - Administrator, Civil Service (Medical): No    Lack of Transportation (Non-Medical): No  Physical Activity: Insufficiently Active (04/03/2023)   Exercise Vital Sign    Days of Exercise per Week: 1 day    Minutes of Exercise per Session: 30 min  Stress: No Stress Concern Present (04/03/2023)   [employer] of Occupational Health - Occupational Stress Questionnaire    Feeling of Stress : Only a little  Social Connections: Unknown (04/03/2023)   Social Connection and Isolation Panel [NHANES]    Frequency of Communication  with Friends and Family: More than three times a week    Frequency of Social Gatherings with Friends and Family: Once a week    Attends Religious Services: Patient declined    Database administrator or Organizations: No    Attends Engineer, structural: Not on file    Marital Status: Married  Catering manager Violence: Not on file     Constitutional: Patient reports intermittent headaches.  Denies fever, malaise, fatigue, or abrupt weight changes.  HEENT: Denies eye pain, eye redness, ear pain, ringing in the ears, wax buildup, runny nose, nasal congestion, bloody nose, or sore throat. Respiratory: Denies difficulty breathing, shortness of breath, cough or sputum production.   Cardiovascular: Denies chest pain, chest tightness, palpitations or swelling in the hands or feet.  Gastrointestinal: Denies abdominal pain, bloating, constipation, diarrhea or blood in the stool.  GU: Patient reports erectile dysfunction.  Denies urgency, frequency, pain with urination, burning sensation, blood in urine, odor or discharge. Musculoskeletal: Patient reports joint pain in knees.  Denies decrease in range of motion, difficulty with gait, muscle pain or joint swelling.  Skin: Denies redness, rashes, lesions or ulcercations.  Neurological: Denies dizziness, difficulty with memory, difficulty with speech or problems with balance and coordination.  Psych: Patient has a history of anxiety.  Denies depression, SI/HI.  No other specific complaints in a complete review of systems (except as listed in HPI above).  Objective:   Physical Exam   BP 108/68 (BP Location: Left Arm, Patient Position: Sitting, Cuff Size: Large)   Pulse 97   Temp (!) 97.1 F (36.2 C) (Temporal)   Wt (!) 305 lb (138.3 kg)   SpO2 98%   BMI 42.54 kg/m   Wt Readings from Last 3 Encounters:  04/06/23 (!) 305 lb 12.8 oz (138.7 kg)  03/06/23 (!) 303 lb (137.4 kg)  02/06/23 (!) 303 lb (137.4 kg)    General: Appears their  stated age, obese, in NAD. Skin: Warm, dry and intact. No ulcerations noted. HEENT: Head: normal shape and size; Eyes: sclera white, no icterus, conjunctiva pink, PERRLA and EOMs intact;  Cardiovascular: Normal rate and rhythm. S1,S2 noted.  No murmur, rubs or gallops noted. No JVD or BLE edema. No carotid bruits noted. Pulmonary/Chest: Normal effort and positive vesicular breath sounds. No respiratory distress. No wheezes, rales or ronchi noted.  Musculoskeletal:  No difficulty with gait.  Neurological: Alert and oriented.  Coordination normal.      BMET    Component Value Date/Time   NA 140 04/06/2023 1344  K 4.3 04/06/2023 1344   CL 100 04/06/2023 1344   CO2 30 04/06/2023 1344   GLUCOSE 117 04/06/2023 1344   BUN 13 04/06/2023 1344   CREATININE 1.06 04/06/2023 1344   CALCIUM 9.4 04/06/2023 1344   GFRNONAA >60 06/11/2018 1018   GFRAA >60 06/11/2018 1018    Lipid Panel     Component Value Date/Time   CHOL 159 04/06/2023 1344   TRIG 155 (H) 04/06/2023 1344   HDL 44 04/06/2023 1344   CHOLHDL 3.6 04/06/2023 1344   VLDL 19.8 03/26/2020 0849   LDLCALC 90 04/06/2023 1344    CBC    Component Value Date/Time   WBC 9.2 04/06/2023 1344   RBC 5.04 04/06/2023 1344   HGB 13.7 04/06/2023 1344   HGB 14.8 12/07/2017 0847   HCT 40.3 04/06/2023 1344   HCT 43.4 12/07/2017 0847   PLT 301 04/06/2023 1344   MCV 80.0 04/06/2023 1344   MCH 27.2 04/06/2023 1344   MCHC 34.0 04/06/2023 1344   RDW 13.9 04/06/2023 1344   LYMPHSABS 2.2 12/30/2020 1600   MONOABS 0.5 12/30/2020 1600   EOSABS 0.3 12/30/2020 1600   BASOSABS 0.1 12/30/2020 1600    Hgb A1C Lab Results  Component Value Date   HGBA1C 6.8 (H) 04/06/2023           Assessment & Plan:    RTC in 3 months for follow-up of DM2 Nicki Reaper, NP

## 2023-04-28 ENCOUNTER — Other Ambulatory Visit: Payer: Self-pay | Admitting: Internal Medicine

## 2023-04-30 NOTE — Telephone Encounter (Signed)
Requested Prescriptions  Pending Prescriptions Disp Refills   atorvastatin (LIPITOR) 10 MG tablet [Pharmacy Med Name: ATORVASTATIN 10 MG TABLET] 90 tablet 1    Sig: TAKE 1 TABLET BY MOUTH EVERY DAY     Cardiovascular:  Antilipid - Statins Failed - 04/28/2023  4:18 PM      Failed - Lipid Panel in normal range within the last 12 months    Cholesterol  Date Value Ref Range Status  04/06/2023 159 <200 mg/dL Final   LDL Cholesterol (Calc)  Date Value Ref Range Status  04/06/2023 90 mg/dL (calc) Final    Comment:    Reference range: <100 . Desirable range <100 mg/dL for primary prevention;   <70 mg/dL for patients with CHD or diabetic patients  with > or = 2 CHD risk factors. Marland Kitchen LDL-C is now calculated using the Martin-Hopkins  calculation, which is a validated novel method providing  better accuracy than the Friedewald equation in the  estimation of LDL-C.  Horald Pollen et al. Lenox Ahr. 1610;960(45): 2061-2068  (http://education.[facility].com/faq/FAQ164)    Direct LDL  Date Value Ref Range Status  06/04/2018 157.0 mg/dL Final    Comment:    Optimal:  <100 mg/dLNear or Above Optimal:  100-129 mg/dLBorderline High:  130-159 mg/dLHigh:  160-189 mg/dLVery High:  >190 mg/dL   HDL  Date Value Ref Range Status  04/06/2023 44 > OR = 40 mg/dL Final   Triglycerides  Date Value Ref Range Status  04/06/2023 155 (H) <150 mg/dL Final         Passed - Patient is not pregnant      Passed - Valid encounter within last 12 months    Recent Outpatient Visits           2 weeks ago Type 2 diabetes mellitus with hyperglycemia, without long-term current use of insulin (HCC)   [place] [facility] [place], Salvadore Oxford, NP   3 weeks ago Encounter for general adult medical examination with abnormal findings   [place] [facility] [place], [place] W, NP   2 months ago Hearing loss of both ears due to cerumen impaction   [place] [facility]  [place], Salvadore Oxford, NP   8 months ago Prediabetes   [place] [facility], Salvadore Oxford, NP   1 year ago Generalized abdominal pain   [facility] [place], Salvadore Oxford, NP       Future Appointments             In 1 week Vanga, Loel Dubonnet, MD [facility] [place] [facility] [place]   In 2 months Baity, Salvadore Oxford, NP [place] [facility], [facility]   In 5 months [place], Salvadore Oxford, NP [place] [facility], [facility]             ezetimibe (ZETIA) 10 MG tablet [Pharmacy Med Name: EZETIMIBE 10 MG TABLET] 90 tablet 1    Sig: TAKE 1 TABLET BY MOUTH EVERY DAY     Cardiovascular:  Antilipid - Sterol Transport Inhibitors Failed - 04/28/2023  4:18 PM      Failed - Lipid Panel in normal range within the last 12 months    Cholesterol  Date Value Ref Range Status  04/06/2023 159 <200 mg/dL Final   LDL Cholesterol (Calc)  Date Value Ref Range Status  04/06/2023 90 mg/dL (calc) Final    Comment:    Reference range: <100 . Desirable  range <100 mg/dL for primary prevention;   <70 mg/dL for patients with CHD or diabetic patients  with > or = 2 CHD risk factors. Marland Kitchen LDL-C is now calculated using the Martin-Hopkins  calculation, which is a validated novel method providing  better accuracy than the Friedewald equation in the  estimation of LDL-C.  Horald Pollen et al. Lenox Ahr. 1610;960(45): 2061-2068  (http://education.[facility].com/faq/FAQ164)    Direct LDL  Date Value Ref Range Status  06/04/2018 157.0 mg/dL Final    Comment:    Optimal:  <100 mg/dLNear or Above Optimal:  100-129 mg/dLBorderline High:  130-159 mg/dLHigh:  160-189 mg/dLVery High:  >190 mg/dL   HDL  Date Value Ref Range Status  04/06/2023 44 > OR = 40 mg/dL Final   Triglycerides  Date Value Ref Range Status  04/06/2023 155 (H) <150 mg/dL Final         Passed - AST in normal range and within 360 days    AST  Date Value Ref Range Status   04/06/2023 15 10 - 35 U/L Final         Passed - ALT in normal range and within 360 days    ALT  Date Value Ref Range Status  04/06/2023 23 9 - 46 U/L Final         Passed - Patient is not pregnant      Passed - Valid encounter within last 12 months    Recent Outpatient Visits           2 weeks ago Type 2 diabetes mellitus with hyperglycemia, without long-term current use of insulin [facility])   [place] [facility] [place], Salvadore Oxford, NP   3 weeks ago Encounter for general adult medical examination with abnormal findings   [place] [facility] [place], [place] W, NP   2 months ago Hearing loss of both ears due to cerumen impaction   [place] [facility] [place], Salvadore Oxford, NP   8 months ago Prediabetes   [place] [facility] [place], Salvadore Oxford, NP   1 year ago Generalized abdominal pain   [place] [facility] [place], Salvadore Oxford, NP       Future Appointments             In 1 week Vanga, Loel Dubonnet, MD [facility] [place] [facility] [place]   In 2 months Baity, Salvadore Oxford, NP [place] [facility], [facility]   In 5 months [place], Salvadore Oxford, NP [place] [facility], [facility]

## 2023-05-07 ENCOUNTER — Other Ambulatory Visit: Payer: Self-pay | Admitting: Cardiology

## 2023-05-07 DIAGNOSIS — R0609 Other forms of dyspnea: Secondary | ICD-10-CM

## 2023-05-07 DIAGNOSIS — Z01810 Encounter for preprocedural cardiovascular examination: Secondary | ICD-10-CM

## 2023-05-07 DIAGNOSIS — I10 Essential (primary) hypertension: Secondary | ICD-10-CM

## 2023-05-10 ENCOUNTER — Ambulatory Visit: Payer: Managed Care, Other (non HMO) | Admitting: Gastroenterology

## 2023-05-30 ENCOUNTER — Other Ambulatory Visit: Payer: Self-pay | Admitting: Cardiology

## 2023-05-30 DIAGNOSIS — Z01810 Encounter for preprocedural cardiovascular examination: Secondary | ICD-10-CM

## 2023-05-30 DIAGNOSIS — I10 Essential (primary) hypertension: Secondary | ICD-10-CM

## 2023-05-30 DIAGNOSIS — R0609 Other forms of dyspnea: Secondary | ICD-10-CM

## 2023-06-01 ENCOUNTER — Other Ambulatory Visit: Payer: Self-pay | Admitting: Cardiology

## 2023-06-01 DIAGNOSIS — R0609 Other forms of dyspnea: Secondary | ICD-10-CM

## 2023-06-01 DIAGNOSIS — I10 Essential (primary) hypertension: Secondary | ICD-10-CM

## 2023-06-01 DIAGNOSIS — Z01810 Encounter for preprocedural cardiovascular examination: Secondary | ICD-10-CM

## 2023-06-02 ENCOUNTER — Other Ambulatory Visit: Payer: Self-pay | Admitting: Internal Medicine

## 2023-06-04 NOTE — Telephone Encounter (Signed)
Requested Prescriptions  Pending Prescriptions Disp Refills   nortriptyline (PAMELOR) 50 MG capsule [Pharmacy Med Name: NORTRIPTYLINE HCL 50 MG CAP] 90 capsule 0    Sig: TAKE 1 CAPSULE BY MOUTH EVERYDAY AT BEDTIME     Psychiatry:  Antidepressants - Heterocyclics (TCAs) Passed - 06/02/2023  1:01 AM      Passed - Valid encounter within last 6 months    Recent Outpatient Visits           1 month ago Type 2 diabetes mellitus with hyperglycemia, without long-term current use of insulin [facility])   [place] [facility] [place], Salvadore Oxford, NP   1 month ago Encounter for general adult medical examination with abnormal findings   [place] [facility] [place], Salvadore Oxford, NP   4 months ago Hearing loss of both ears due to cerumen impaction   [place] [facility] [place], Salvadore Oxford, NP   9 months ago Prediabetes   [place] [facility] [facility], Salvadore Oxford, NP   1 year ago Generalized abdominal pain   [place] [facility] [place], Salvadore Oxford, NP       Future Appointments             In 1 month [place], Salvadore Oxford, NP [place] [facility], [facility]   In 3 months Vanga, Loel Dubonnet, MD [facility] [place] [facility] at [place]   In 4 months Baity, Salvadore Oxford, NP [place] [facility], [facility]

## 2023-06-06 ENCOUNTER — Other Ambulatory Visit: Payer: Managed Care, Other (non HMO)

## 2023-06-06 DIAGNOSIS — Z302 Encounter for sterilization: Secondary | ICD-10-CM

## 2023-06-08 ENCOUNTER — Other Ambulatory Visit: Payer: Self-pay

## 2023-06-08 DIAGNOSIS — Z302 Encounter for sterilization: Secondary | ICD-10-CM

## 2023-06-08 LAB — POST-VAS SPERM EVALUATION,QUAL

## 2023-06-12 ENCOUNTER — Other Ambulatory Visit: Payer: Managed Care, Other (non HMO)

## 2023-06-12 DIAGNOSIS — Z302 Encounter for sterilization: Secondary | ICD-10-CM

## 2023-06-13 LAB — POST-VAS SPERM EVALUATION,QUAL: Volume: 0.8 mL

## 2023-06-20 ENCOUNTER — Other Ambulatory Visit: Payer: Managed Care, Other (non HMO)

## 2023-06-24 ENCOUNTER — Other Ambulatory Visit: Payer: Self-pay | Admitting: Gastroenterology

## 2023-07-20 ENCOUNTER — Ambulatory Visit: Payer: Managed Care, Other (non HMO) | Admitting: Internal Medicine

## 2023-07-20 ENCOUNTER — Telehealth (INDEPENDENT_AMBULATORY_CARE_PROVIDER_SITE_OTHER): Payer: Managed Care, Other (non HMO) | Admitting: Internal Medicine

## 2023-07-20 VITALS — Wt 256.0 lb

## 2023-07-20 DIAGNOSIS — M170 Bilateral primary osteoarthritis of knee: Secondary | ICD-10-CM

## 2023-07-20 DIAGNOSIS — E1165 Type 2 diabetes mellitus with hyperglycemia: Secondary | ICD-10-CM

## 2023-07-20 DIAGNOSIS — R519 Headache, unspecified: Secondary | ICD-10-CM

## 2023-07-20 DIAGNOSIS — E1169 Type 2 diabetes mellitus with other specified complication: Secondary | ICD-10-CM

## 2023-07-20 DIAGNOSIS — F411 Generalized anxiety disorder: Secondary | ICD-10-CM

## 2023-07-20 DIAGNOSIS — I10 Essential (primary) hypertension: Secondary | ICD-10-CM | POA: Diagnosis not present

## 2023-07-20 DIAGNOSIS — N528 Other male erectile dysfunction: Secondary | ICD-10-CM

## 2023-07-20 DIAGNOSIS — Z6835 Body mass index (BMI) 35.0-35.9, adult: Secondary | ICD-10-CM

## 2023-07-20 DIAGNOSIS — E785 Hyperlipidemia, unspecified: Secondary | ICD-10-CM

## 2023-07-20 DIAGNOSIS — E349 Endocrine disorder, unspecified: Secondary | ICD-10-CM

## 2023-07-20 DIAGNOSIS — K219 Gastro-esophageal reflux disease without esophagitis: Secondary | ICD-10-CM | POA: Diagnosis not present

## 2023-07-20 MED ORDER — CELECOXIB 200 MG PO CAPS: 200.0000 mg | ORAL_CAPSULE | Freq: Every day | ORAL | 0 refills | Status: DC

## 2023-07-20 NOTE — Assessment & Plan Note (Signed)
Continue nortriptyline and metoprolol as previously prescribed We will monitor

## 2023-07-20 NOTE — Progress Notes (Signed)
Virtual Visit via Video Note  I connected with Kenneth Sherman on 07/20/23 at  1:20 PM EDT by a video enabled telemedicine application and verified that I am speaking with the correct person using two identifiers.  Location: Patient: In his car Provider: Home  Persons participating in this video call: Nicki Reaper, NP and Kenneth Sherman   I discussed the limitations of evaluation and management by telemedicine and the availability of in person appointments. The patient expressed understanding and agreed to proceed.  History of Present Illness:  Patient due for 38-month follow-up of chronic conditions.  Frequent headaches: These occur a few times per month.  Triggered by skipping his nortriptyline. He takes nortriptyline and metoprolol as prescribed.  He does not follow with neurology.  ED: Managed with sildenafil as needed.  He does not follow with urology.  Anxiety: Situational.  He is not taking any medication for this at this time.  He is not seeing a therapist.  He denies depression, SI/HI.  GERD: He is not sure what triggers this.  He denies breakthrough on omeprazole.  Upper GI from 05/2022 reviewed.  HTN: He does not check his BP at home.  He is taking lisinopril HCT and metoprolol as prescribed.  ECG from 10/2021 reviewed.  DM2: His last A1c was 6.8%, 04/2023.  He is not taking any oral diabetic medication at this time. He has lost 42 lbs in the last 3 months. He does not check his sugars.  He checks his feet routinely.  His last eye exam was 01/2023.  Flu never.  Pneumovax never.  COVID x 3.  HLD: His last LDL was 90, triglycerides 737, 04/2023.  He denies myalgias on atorvastatin and ezetimibe.  He does not consume a low-fat diet.  Hypotestosteronism: He no longer takes testosterone supplement status post biceps tendon rupture.  He does not follow with urology.  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  He takes celebrex as needed with good relief of symptoms.  He does not follow with orthopedics.     Past Medical History:  Diagnosis Date   Arthritis    Coronary atherosclerosis due to calcified coronary lesion    GERD (gastroesophageal reflux disease)    Heartburn    HLD (hyperlipidemia)    Hypertension    no meds   Hypogonadism in male    Right knee DJD    Thyroid disease     Current Outpatient Medications  Medication Sig Dispense Refill   atorvastatin (LIPITOR) 10 MG tablet TAKE 1 TABLET BY MOUTH EVERY DAY 90 tablet 1   celecoxib (CELEBREX) 200 MG capsule Take 200 mg by mouth daily.     ezetimibe (ZETIA) 10 MG tablet TAKE 1 TABLET BY MOUTH EVERY DAY 90 tablet 1   lisinopril-hydrochlorothiazide (ZESTORETIC) 10-12.5 MG tablet TAKE 1 TABLET BY MOUTH EVERY DAY 90 tablet 0   metoprolol succinate (TOPROL-XL) 25 MG 24 hr tablet TAKE 1 TABLET (25 MG TOTAL) BY MOUTH IN THE MORNING 90 tablet 0   nortriptyline (PAMELOR) 50 MG capsule TAKE 1 CAPSULE BY MOUTH EVERYDAY AT BEDTIME 90 capsule 0   omeprazole (PRILOSEC) 40 MG capsule TAKE 1 CAPSULE (40 MG TOTAL) BY MOUTH 2 (TWO) TIMES DAILY BEFORE A MEAL. 60 capsule 2   tadalafil (CIALIS) 10 MG tablet Take 1-2 tablets (10-20 mg total) by mouth daily as needed for erectile dysfunction (take one hour prior to sexual activity). 30 tablet 11   No current facility-administered medications for this visit.    No Known Allergies  Family History  Problem Relation Age of Onset   Heart attack Mother    Hypertension Mother    Hypertension Father    Heart attack Father    Hypertension Brother    Healthy Brother    Healthy Brother    Healthy Brother    Kidney disease Neg Hx    Prostate cancer Neg Hx    Bladder Cancer Neg Hx    Colon cancer Neg Hx     Social History   Socioeconomic History   Marital status: Married    Spouse name: Not on file   Number of children: Not on file   Years of education: 14   Highest education level: Associate degree: occupational, Scientist, product/process development, or vocational program  Occupational History   Occupation: CAD  Copywriter, advertising: [employer]  Tobacco Use   Smoking status: Never   Smokeless tobacco: Never  Vaping Use   Vaping status: Never Used  Substance and Sexual Activity   Alcohol use: Yes    Alcohol/week: 2.0 standard drinks of alcohol    Types: 1 Glasses of wine, 1 Cans of beer per week    Comment: occasionally   Drug use: No   Sexual activity: Yes    Birth control/protection: Condom  Other Topics Concern   Not on file  Social History Narrative   Regular exercise-no   Caffeine Use-yes   Social Determinants of Health   Financial Resource Strain: Low Risk  (04/03/2023)   Overall Financial Resource Strain (CARDIA)    Difficulty of Paying Living Expenses: Not hard at all  Food Insecurity: No Food Insecurity (04/03/2023)   Hunger Vital Sign    Worried About Running Out of Food in the Last Year: Never true    Ran Out of Food in the Last Year: Never true  Transportation Needs: No Transportation Needs (04/03/2023)   PRAPARE - Administrator, Civil Service (Medical): No    Lack of Transportation (Non-Medical): No  Physical Activity: Insufficiently Active (04/03/2023)   Exercise Vital Sign    Days of Exercise per Week: 1 day    Minutes of Exercise per Session: 30 min  Stress: No Stress Concern Present (04/03/2023)   [employer] of Occupational Health - Occupational Stress Questionnaire    Feeling of Stress : Only a little  Social Connections: Unknown (04/03/2023)   Social Connection and Isolation Panel [NHANES]    Frequency of Communication with Friends and Family: More than three times a week    Frequency of Social Gatherings with Friends and Family: Once a week    Attends Religious Services: Patient declined    Database administrator or Organizations: No    Attends Engineer, structural: Not on file    Marital Status: Married  Intimate Partner Violence: Unknown (03/17/2022)   Received from [facility]   HITS    Physically Hurt: Not on file    Insult  or Talk Down To: Not on file    Threaten Physical Harm: Not on file    Scream or Curse: Not on file     Constitutional: Patient reports intermittent headaches.  Denies fever, malaise, fatigue, or abrupt weight changes.  HEENT: Denies eye pain, eye redness, ear pain, ringing in the ears, wax buildup, runny nose, nasal congestion, bloody nose, or sore throat. Respiratory: Denies difficulty breathing, shortness of breath, cough or sputum production.   Cardiovascular: Denies chest pain, chest tightness, palpitations or swelling in the hands or feet.  Gastrointestinal: Denies abdominal pain, bloating, constipation, diarrhea or blood in the stool.  GU: Patient reports erectile dysfunction.  Denies urgency, frequency, pain with urination, burning sensation, blood in urine, odor or discharge. Musculoskeletal: Patient reports intermittent knee pain.  Denies decrease in range of motion, difficulty with gait, muscle pain or joint swelling.  Skin: Denies redness, rashes, lesions or ulcercations.  Neurological: Denies dizziness, difficulty with memory, difficulty with speech or problems with balance and coordination.  Psych: Patient has a history of anxiety.  Denies depression, SI/HI.  No other specific complaints in a complete review of systems (except as listed in HPI above).  Observations/Objective:   Wt Readings from Last 3 Encounters:  04/13/23 (!) 305 lb (138.3 kg)  04/06/23 (!) 305 lb 12.8 oz (138.7 kg)  03/06/23 (!) 303 lb (137.4 kg)    General: Appears his stated age, obese,, in NAD. Skin: No ulcerations reported. Pulmonary/Chest: Normal effort. No respiratory distress.  Neurological: Alert and oriented. Coordination normal.  Psychiatric: Mood and affect normal. Behavior is normal. Judgment and thought content normal.    BMET    Component Value Date/Time   NA 140 04/06/2023 1344   K 4.3 04/06/2023 1344   CL 100 04/06/2023 1344   CO2 30 04/06/2023 1344   GLUCOSE 117 04/06/2023  1344   BUN 13 04/06/2023 1344   CREATININE 1.06 04/06/2023 1344   CALCIUM 9.4 04/06/2023 1344   GFRNONAA >60 06/11/2018 1018   GFRAA >60 06/11/2018 1018    Lipid Panel     Component Value Date/Time   CHOL 159 04/06/2023 1344   TRIG 155 (H) 04/06/2023 1344   HDL 44 04/06/2023 1344   CHOLHDL 3.6 04/06/2023 1344   VLDL 19.8 03/26/2020 0849   LDLCALC 90 04/06/2023 1344    CBC    Component Value Date/Time   WBC 9.2 04/06/2023 1344   RBC 5.04 04/06/2023 1344   HGB 13.7 04/06/2023 1344   HGB 14.8 12/07/2017 0847   HCT 40.3 04/06/2023 1344   HCT 43.4 12/07/2017 0847   PLT 301 04/06/2023 1344   MCV 80.0 04/06/2023 1344   MCH 27.2 04/06/2023 1344   MCHC 34.0 04/06/2023 1344   RDW 13.9 04/06/2023 1344   LYMPHSABS 2.2 12/30/2020 1600   MONOABS 0.5 12/30/2020 1600   EOSABS 0.3 12/30/2020 1600   BASOSABS 0.1 12/30/2020 1600    Hgb A1C Lab Results  Component Value Date   HGBA1C 6.8 (H) 04/06/2023       Assessment and Plan:  RTC in 6 months, follow-up chronic conditions  Follow Up Instructions:    I discussed the assessment and treatment plan with the patient. The patient was provided an opportunity to ask questions and all were answered. The patient agreed with the plan and demonstrated an understanding of the instructions.   The patient was advised to call back or seek an in-person evaluation if the symptoms worsen or if the condition fails to improve as anticipated.    Nicki Reaper, NP

## 2023-07-20 NOTE — Assessment & Plan Note (Signed)
Not medicated Support offered 

## 2023-07-20 NOTE — Assessment & Plan Note (Signed)
C-Met and lipid profile ordered Encouraged him to consume a low-fat diet Continue atorvastatin and ezetimibe

## 2023-07-20 NOTE — Patient Instructions (Signed)
Calorie Counting for Weight Loss Calories are units of energy. Your body needs a certain number of calories from food to keep going throughout the day. When you eat or drink more calories than your body needs, your body stores the extra calories mostly as fat. When you eat or drink fewer calories than your body needs, your body burns fat to get the energy it needs. Calorie counting means keeping track of how many calories you eat and drink each day. Calorie counting can be helpful if you need to lose weight. If you eat fewer calories than your body needs, you should lose weight. Ask your health care provider what a healthy weight is for you. For calorie counting to work, you will need to eat the right number of calories each day to lose a healthy amount of weight per week. A dietitian can help you figure out how many calories you need in a day and will suggest ways to reach your calorie goal. A healthy amount of weight to lose each week is usually 1-2 lb (0.5-0.9 kg). This usually means that your daily calorie intake should be reduced by 500-750 calories. Eating 1,200-1,500 calories a day can help most women lose weight. Eating 1,500-1,800 calories a day can help most men lose weight. What do I need to know about calorie counting? Work with your health care provider or dietitian to determine how many calories you should get each day. To meet your daily calorie goal, you will need to: Find out how many calories are in each food that you would like to eat. Try to do this before you eat. Decide how much of the food you plan to eat. Keep a food log. Do this by writing down what you ate and how many calories it had. To successfully lose weight, it is important to balance calorie counting with a healthy lifestyle that includes regular activity. Where do I find calorie information?  The number of calories in a food can be found on a Nutrition Facts label. If a food does not have a Nutrition Facts label, try  to look up the calories online or ask your dietitian for help. Remember that calories are listed per serving. If you choose to have more than one serving of a food, you will have to multiply the calories per serving by the number of servings you plan to eat. For example, the label on a package of bread might say that a serving size is 1 slice and that there are 90 calories in a serving. If you eat 1 slice, you will have eaten 90 calories. If you eat 2 slices, you will have eaten 180 calories. How do I keep a food log? After each time that you eat, record the following in your food log as soon as possible: What you ate. Be sure to include toppings, sauces, and other extras on the food. How much you ate. This can be measured in cups, ounces, or number of items. How many calories were in each food and drink. The total number of calories in the food you ate. Keep your food log near you, such as in a pocket-sized notebook or on an app or website on your mobile phone. Some programs will calculate calories for you and show you how many calories you have left to meet your daily goal. What are some portion-control tips? Know how many calories are in a serving. This will help you know how many servings you can have of a certain   food. Use a measuring cup to measure serving sizes. You could also try weighing out portions on a kitchen scale. With time, you will be able to estimate serving sizes for some foods. Take time to put servings of different foods on your favorite plates or in your favorite bowls and cups so you know what a serving looks like. Try not to eat straight from a food's packaging, such as from a bag or box. Eating straight from the package makes it hard to see how much you are eating and can lead to overeating. Put the amount you would like to eat in a cup or on a plate to make sure you are eating the right portion. Use smaller plates, glasses, and bowls for smaller portions and to prevent  overeating. Try not to multitask. For example, avoid watching TV or using your computer while eating. If it is time to eat, sit down at a table and enjoy your food. This will help you recognize when you are full. It will also help you be more mindful of what and how much you are eating. What are tips for following this plan? Reading food labels Check the calorie count compared with the serving size. The serving size may be smaller than what you are used to eating. Check the source of the calories. Try to choose foods that are high in protein, fiber, and vitamins, and low in saturated fat, trans fat, and sodium. Shopping Read nutrition labels while you shop. This will help you make healthy decisions about which foods to buy. Pay attention to nutrition labels for low-fat or fat-free foods. These foods sometimes have the same number of calories or more calories than the full-fat versions. They also often have added sugar, starch, or salt to make up for flavor that was removed with the fat. Make a grocery list of lower-calorie foods and stick to it. Cooking Try to cook your favorite foods in a healthier way. For example, try baking instead of frying. Use low-fat dairy products. Meal planning Use more fruits and vegetables. One-half of your plate should be fruits and vegetables. Include lean proteins, such as chicken, turkey, and fish. Lifestyle Each week, aim to do one of the following: 150 minutes of moderate exercise, such as walking. 75 minutes of vigorous exercise, such as running. General information Know how many calories are in the foods you eat most often. This will help you calculate calorie counts faster. Find a way of tracking calories that works for you. Get creative. Try different apps or programs if writing down calories does not work for you. What foods should I eat?  Eat nutritious foods. It is better to have a nutritious, high-calorie food, such as an avocado, than a food with  few nutrients, such as a bag of potato chips. Use your calories on foods and drinks that will fill you up and will not leave you hungry soon after eating. Examples of foods that fill you up are nuts and nut butters, vegetables, lean proteins, and high-fiber foods such as whole grains. High-fiber foods are foods with more than 5 g of fiber per serving. Pay attention to calories in drinks. Low-calorie drinks include water and unsweetened drinks. The items listed above may not be a complete list of foods and beverages you can eat. Contact a dietitian for more information. What foods should I limit? Limit foods or drinks that are not good sources of vitamins, minerals, or protein or that are high in unhealthy fats. These   include: Candy. Other sweets. Sodas, specialty coffee drinks, alcohol, and juice. The items listed above may not be a complete list of foods and beverages you should avoid. Contact a dietitian for more information. How do I count calories when eating out? Pay attention to portions. Often, portions are much larger when eating out. Try these tips to keep portions smaller: Consider sharing a meal instead of getting your own. If you get your own meal, eat only half of it. Before you start eating, ask for a container and put half of your meal into it. When available, consider ordering smaller portions from the menu instead of full portions. Pay attention to your food and drink choices. Knowing the way food is cooked and what is included with the meal can help you eat fewer calories. If calories are listed on the menu, choose the lower-calorie options. Choose dishes that include vegetables, fruits, whole grains, low-fat dairy products, and lean proteins. Choose items that are boiled, broiled, grilled, or steamed. Avoid items that are buttered, battered, fried, or served with cream sauce. Items labeled as crispy are usually fried, unless stated otherwise. Choose water, low-fat milk,  unsweetened iced tea, or other drinks without added sugar. If you want an alcoholic beverage, choose a lower-calorie option, such as a glass of wine or light beer. Ask for dressings, sauces, and syrups on the side. These are usually high in calories, so you should limit the amount you eat. If you want a salad, choose a garden salad and ask for grilled meats. Avoid extra toppings such as bacon, cheese, or fried items. Ask for the dressing on the side, or ask for olive oil and vinegar or lemon to use as dressing. Estimate how many servings of a food you are given. Knowing serving sizes will help you be aware of how much food you are eating at restaurants. Where to find more information Centers for Disease Control and Prevention: www.cdc.gov U.[place]. Department of Agriculture: myplate.gov Summary Calorie counting means keeping track of how many calories you eat and drink each day. If you eat fewer calories than your body needs, you should lose weight. A healthy amount of weight to lose per week is usually 1-2 lb (0.5-0.9 kg). This usually means reducing your daily calorie intake by 500-750 calories. The number of calories in a food can be found on a Nutrition Facts label. If a food does not have a Nutrition Facts label, try to look up the calories online or ask your dietitian for help. Use smaller plates, glasses, and bowls for smaller portions and to prevent overeating. Use your calories on foods and drinks that will fill you up and not leave you hungry shortly after a meal. This information is not intended to replace advice given to you by your health care provider. Make sure you discuss any questions you have with your health care provider. Document Revised: 01/08/2020 Document Reviewed: 01/08/2020 Elsevier Patient Education  2023 Elsevier Inc.  

## 2023-07-20 NOTE — Assessment & Plan Note (Signed)
Controlled on lisinopril HCT and metoprolol Reinforced DASH diet and exercise for weight loss C-Met ordered

## 2023-07-20 NOTE — Assessment & Plan Note (Signed)
Encourage weight loss as this can help reduce reflux symptoms Continue Celebrex, refill today

## 2023-07-20 NOTE — Assessment & Plan Note (Signed)
Avoid foods that trigger reflux Encourage weight loss as this can help reduce reflux symptoms Continue omeprazole 

## 2023-07-20 NOTE — Assessment & Plan Note (Signed)
Not medicated We will monitor 

## 2023-07-20 NOTE — Assessment & Plan Note (Signed)
A1c and urine microalbumin ordered Congratulated him on 42 pound weight loss Encouraged low-carb diet and exercise Not medicated Encourage routine eye exam Encouraged routine foot exam Encouraged him to get a flu shot in the fall Will discuss Pneumovax at next visit

## 2023-07-20 NOTE — Assessment & Plan Note (Signed)
Congratulated him on 42 pound weight loss Encouraged continue diet and exercise

## 2023-07-20 NOTE — Assessment & Plan Note (Signed)
Continue sildenafil as needed. 

## 2023-07-26 ENCOUNTER — Other Ambulatory Visit: Payer: Managed Care, Other (non HMO)

## 2023-07-26 DIAGNOSIS — E1165 Type 2 diabetes mellitus with hyperglycemia: Secondary | ICD-10-CM

## 2023-07-27 LAB — COMPLETE METABOLIC PANEL WITH GFR
AG Ratio: 1.6 (calc) (ref 1.0–2.5)
ALT: 31 U/L (ref 9–46)
AST: 23 U/L (ref 10–35)
Albumin: 4.4 g/dL (ref 3.6–5.1)
Alkaline phosphatase (APISO): 87 U/L (ref 35–144)
BUN: 17 mg/dL (ref 7–25)
CO2: 30 mmol/L (ref 20–32)
Calcium: 9.6 mg/dL (ref 8.6–10.3)
Chloride: 98 mmol/L (ref 98–110)
Creat: 0.8 mg/dL (ref 0.70–1.30)
Globulin: 2.7 g/dL (ref 1.9–3.7)
Glucose, Bld: 97 mg/dL (ref 65–99)
Potassium: 4.6 mmol/L (ref 3.5–5.3)
Sodium: 139 mmol/L (ref 135–146)
Total Bilirubin: 0.5 mg/dL (ref 0.2–1.2)
Total Protein: 7.1 g/dL (ref 6.1–8.1)
eGFR: 106 mL/min/{1.73_m2} (ref >=60)

## 2023-07-27 LAB — LIPID PANEL
Cholesterol: 146 mg/dL (ref <200)
HDL: 45 mg/dL (ref >=40)
LDL Cholesterol (Calc): 84 mg/dL
Non-HDL Cholesterol (Calc): 101 mg/dL (ref <130)
Total CHOL/HDL Ratio: 3.2 (calc) (ref <5.0)
Triglycerides: 78 mg/dL (ref <150)

## 2023-07-27 LAB — HEMOGLOBIN A1C
Hgb A1c MFr Bld: 6 %{Hb} — ABNORMAL HIGH (ref <5.7)
Mean Plasma Glucose: 126 mg/dL
eAG (mmol/L): 7 mmol/L

## 2023-07-27 LAB — MICROALBUMIN / CREATININE URINE RATIO
Creatinine, Urine: 39 mg/dL (ref 20–320)
Microalb, Ur: <0.2 mg/dL

## 2023-08-11 ENCOUNTER — Other Ambulatory Visit: Payer: Self-pay | Admitting: Internal Medicine

## 2023-08-11 DIAGNOSIS — Z0000 Encounter for general adult medical examination without abnormal findings: Secondary | ICD-10-CM

## 2023-08-14 NOTE — Telephone Encounter (Signed)
Requested Prescriptions  Pending Prescriptions Disp Refills   lisinopril-hydrochlorothiazide (ZESTORETIC) 10-12.5 MG tablet [Pharmacy Med Name: LISINOPRIL-HCTZ 10-12.5 MG TAB] 90 tablet 0    Sig: TAKE 1 TABLET BY MOUTH EVERY DAY     Cardiovascular:  ACEI + Diuretic Combos Passed - 08/11/2023  1:44 PM      Passed - Na in normal range and within 180 days    Sodium  Date Value Ref Range Status  07/26/2023 139 135 - 146 mmol/L Final         Passed - K in normal range and within 180 days    Potassium  Date Value Ref Range Status  07/26/2023 4.6 3.5 - 5.3 mmol/L Final         Passed - Cr in normal range and within 180 days    Creat  Date Value Ref Range Status  07/26/2023 0.80 0.70 - 1.30 mg/dL Final   Creatinine, Urine  Date Value Ref Range Status  07/26/2023 39 20 - 320 mg/dL Final         Passed - eGFR is 30 or above and within 180 days    GFR calc Af Amer  Date Value Ref Range Status  06/11/2018 >60 >60 mL/min Final    Comment:    (NOTE) The eGFR has been calculated using the CKD EPI equation. This calculation has not been validated in all clinical situations. eGFR's persistently <60 mL/min signify possible Chronic Kidney Disease.    GFR calc non Af Amer  Date Value Ref Range Status  06/11/2018 >60 >60 mL/min Final   GFR  Date Value Ref Range Status  12/30/2020 74.24 >60.00 mL/min Final    Comment:    Calculated using the CKD-EPI Creatinine Equation (2021)   eGFR  Date Value Ref Range Status  07/26/2023 106 > OR = 60 mL/min/1.41m2 Final         Passed - Patient is not pregnant      Passed - Last BP in normal range    BP Readings from Last 1 Encounters:  04/13/23 108/68         Passed - Valid encounter within last 6 months    Recent Outpatient Visits           3 weeks ago Type 2 diabetes mellitus with hyperglycemia, without long-term current use of insulin [facility])   [place] [facility] [place], [place] W, NP   4 months ago Type 2  diabetes mellitus with hyperglycemia, without long-term current use of insulin [facility])   [place] [facility] [place], Salvadore [place], NP   4 months ago Encounter for general adult medical examination with abnormal findings   [place] [facility] [place], Salvadore Oxford, NP   6 months ago Hearing loss of both ears due to cerumen impaction   [place] [facility] [place], Salvadore Oxford, NP   11 months ago Prediabetes   [place] [facility] [place], Salvadore Oxford, NP       Future Appointments             In 3 weeks Vanga, Loel Dubonnet, MD [facility] [place]   In 1 month [place], Salvadore Oxford, NP [place] [facility], [facility]

## 2023-09-08 ENCOUNTER — Other Ambulatory Visit: Payer: Self-pay | Admitting: Internal Medicine

## 2023-09-10 ENCOUNTER — Ambulatory Visit: Payer: 59 | Admitting: Gastroenterology

## 2023-09-10 NOTE — Telephone Encounter (Signed)
Requested Prescriptions  Pending Prescriptions Disp Refills   nortriptyline (PAMELOR) 50 MG capsule [Pharmacy Med Name: NORTRIPTYLINE HCL 50 MG CAP] 90 capsule 0    Sig: TAKE 1 CAPSULE BY MOUTH EVERYDAY AT BEDTIME     Psychiatry:  Antidepressants - Heterocyclics (TCAs) Passed - 09/08/2023  1:28 AM      Passed - Valid encounter within last 6 months    Recent Outpatient Visits           1 month ago Type 2 diabetes mellitus with hyperglycemia, without long-term current use of insulin [facility])   [place] [facility] [place], [place] W, NP   5 months ago Type 2 diabetes mellitus with hyperglycemia, without long-term current use of insulin [facility])   [place] [facility] [place], Salvadore Oxford, NP   5 months ago Encounter for general adult medical examination with abnormal findings   [place] [place], [place] W, NP   7 months ago Hearing loss of both ears due to cerumen impaction   [place] [facility] [place], Salvadore Oxford, NP   1 year ago Prediabetes   [place] [facility] [place], Salvadore Oxford, NP       Future Appointments             In 3 weeks Vanga, Loel Dubonnet, MD [facility] [place] [facility] [place]   In 3 weeks [place], Salvadore Oxford, NP [place] [facility], [facility]

## 2023-10-03 ENCOUNTER — Ambulatory Visit: Payer: 59 | Admitting: Gastroenterology

## 2023-10-05 ENCOUNTER — Ambulatory Visit: Payer: Managed Care, Other (non HMO) | Admitting: Internal Medicine

## 2023-10-06 ENCOUNTER — Other Ambulatory Visit: Payer: Self-pay | Admitting: Gastroenterology

## 2023-11-01 ENCOUNTER — Other Ambulatory Visit: Payer: Self-pay | Admitting: Internal Medicine

## 2023-11-02 NOTE — Telephone Encounter (Signed)
Requested Prescriptions  Pending Prescriptions Disp Refills   ezetimibe (ZETIA) 10 MG tablet [Pharmacy Med Name: EZETIMIBE 10 MG TABLET] 90 tablet 1    Sig: TAKE 1 TABLET BY MOUTH EVERY DAY     Cardiovascular:  Antilipid - Sterol Transport Inhibitors Failed - 11/01/2023  1:35 AM      Failed - Lipid Panel in normal range within the last 12 months    Cholesterol  Date Value Ref Range Status  07/26/2023 146 <200 mg/dL Final   LDL Cholesterol (Calc)  Date Value Ref Range Status  07/26/2023 84 mg/dL (calc) Final    Comment:    Reference range: <100 . Desirable range <100 mg/dL for primary prevention;   <70 mg/dL for patients with CHD or diabetic patients  with > or = 2 CHD risk factors. Marland Kitchen LDL-C is now calculated using the Martin-Hopkins  calculation, which is a validated novel method providing  better accuracy than the Friedewald equation in the  estimation of LDL-C.  Horald Pollen et al. Lenox Ahr. 1308;657(84): 2061-2068  (http://education.Quest[employer]ostics.com/faq/FAQ164)    Direct LDL  Date Value Ref Range Status  06/04/2018 157.0 mg/dL Final    Comment:    Optimal:  <100 mg/dLNear or Above Optimal:  100-129 mg/dLBorderline High:  130-159 mg/dLHigh:  160-189 mg/dLVery High:  >190 mg/dL   HDL  Date Value Ref Range Status  07/26/2023 45 > OR = 40 mg/dL Final   Triglycerides  Date Value Ref Range Status  07/26/2023 78 <150 mg/dL Final         Passed - AST in normal range and within 360 days    AST  Date Value Ref Range Status  07/26/2023 23 10 - 35 U/L Final         Passed - ALT in normal range and within 360 days    ALT  Date Value Ref Range Status  07/26/2023 31 9 - 46 U/L Final         Passed - Patient is not pregnant      Passed - Valid encounter within last 12 months    Recent Outpatient Visits           3 months ago Type 2 diabetes mellitus with hyperglycemia, without long-term current use of insulin [facility])   [place] [facility] [place],  [place], NP   6 months ago Type 2 diabetes mellitus with hyperglycemia, without long-term current use of insulin [facility])   [place] [facility] [place], Salvadore Oxford, NP   7 months ago Encounter for general adult medical examination with abnormal findings   [place] [facility] [place], [place] W, NP   9 months ago Hearing loss of both ears due to cerumen impaction   [place] [facility] [place], Salvadore Oxford, NP   1 year ago Prediabetes   [place] [facility] [place], Salvadore Oxford, NP       Future Appointments             In 5 months Baity, Salvadore Oxford, NP [facility], [facility]             atorvastatin (LIPITOR) 10 MG tablet [Pharmacy Med Name: ATORVASTATIN 10 MG TABLET] 90 tablet 1    Sig: TAKE 1 TABLET BY MOUTH EVERY DAY     Cardiovascular:  Antilipid - Statins Failed - 11/01/2023  1:35 AM      Failed - Lipid Panel in normal range  within the last 12 months    Cholesterol  Date Value Ref Range Status  07/26/2023 146 <200 mg/dL Final   LDL Cholesterol (Calc)  Date Value Ref Range Status  07/26/2023 84 mg/dL (calc) Final    Comment:    Reference range: <100 . Desirable range <100 mg/dL for primary prevention;   <70 mg/dL for patients with CHD or diabetic patients  with > or = 2 CHD risk factors. Marland Kitchen LDL-C is now calculated using the Martin-Hopkins  calculation, which is a validated novel method providing  better accuracy than the Friedewald equation in the  estimation of LDL-C.  Horald Pollen et al. Lenox Ahr. 1610;960(45): 2061-2068  (http://education.[employer].com/faq/FAQ164)    Direct LDL  Date Value Ref Range Status  06/04/2018 157.0 mg/dL Final    Comment:    Optimal:  <100 mg/dLNear or Above Optimal:  100-129 mg/dLBorderline High:  130-159 mg/dLHigh:  160-189 mg/dLVery High:  >190 mg/dL   HDL  Date Value Ref Range Status  07/26/2023 45 > OR = 40 mg/dL Final   Triglycerides  Date  Value Ref Range Status  07/26/2023 78 <150 mg/dL Final         Passed - Patient is not pregnant      Passed - Valid encounter within last 12 months    Recent Outpatient Visits           3 months ago Type 2 diabetes mellitus with hyperglycemia, without long-term current use of insulin [facility])   [place] [facility] [place], [place], NP   6 months ago Type 2 diabetes mellitus with hyperglycemia, without long-term current use of insulin [facility])   [facility] [place], Salvadore Oxford, NP   7 months ago Encounter for general adult medical examination with abnormal findings   [place] [facility] [place], [place], NP   9 months ago Hearing loss of both ears due to cerumen impaction   [place] [facility] [place], Salvadore Oxford, NP   1 year ago Prediabetes   [place] [facility] [place], Salvadore Oxford, NP       Future Appointments             In 5 months Baity, Salvadore Oxford, NP [place] [facility], [facility]

## 2023-11-30 ENCOUNTER — Other Ambulatory Visit: Payer: Self-pay

## 2023-11-30 ENCOUNTER — Other Ambulatory Visit: Payer: Self-pay | Admitting: Internal Medicine

## 2023-11-30 ENCOUNTER — Encounter: Payer: Self-pay | Admitting: Internal Medicine

## 2023-11-30 MED ORDER — CELECOXIB 200 MG PO CAPS: 200.0000 mg | ORAL_CAPSULE | Freq: Every day | ORAL | 0 refills | Status: DC

## 2023-12-02 ENCOUNTER — Other Ambulatory Visit: Payer: Self-pay | Admitting: Internal Medicine

## 2023-12-02 DIAGNOSIS — Z0000 Encounter for general adult medical examination without abnormal findings: Secondary | ICD-10-CM

## 2023-12-03 ENCOUNTER — Other Ambulatory Visit: Payer: Self-pay | Admitting: Gastroenterology

## 2023-12-04 NOTE — Telephone Encounter (Signed)
Requested Prescriptions  Pending Prescriptions Disp Refills   lisinopril-hydrochlorothiazide (ZESTORETIC) 10-12.5 MG tablet [Pharmacy Med Name: LISINOPRIL-HCTZ 10-12.5 MG TAB] 90 tablet 0    Sig: TAKE 1 TABLET BY MOUTH EVERY DAY     Cardiovascular:  ACEI + Diuretic Combos Passed - 12/04/2023  8:30 AM      Passed - Na in normal range and within 180 days    Sodium  Date Value Ref Range Status  07/26/2023 139 135 - 146 mmol/L Final         Passed - K in normal range and within 180 days    Potassium  Date Value Ref Range Status  07/26/2023 4.6 3.5 - 5.3 mmol/L Final         Passed - Cr in normal range and within 180 days    Creat  Date Value Ref Range Status  07/26/2023 0.80 0.70 - 1.30 mg/dL Final   Creatinine, Urine  Date Value Ref Range Status  07/26/2023 39 20 - 320 mg/dL Final         Passed - eGFR is 30 or above and within 180 days    GFR calc Af Amer  Date Value Ref Range Status  06/11/2018 >60 >60 mL/min Final    Comment:    (NOTE) The eGFR has been calculated using the CKD EPI equation. This calculation has not been validated in all clinical situations. eGFR's persistently <60 mL/min signify possible Chronic Kidney Disease.    GFR calc non Af Amer  Date Value Ref Range Status  06/11/2018 >60 >60 mL/min Final   GFR  Date Value Ref Range Status  12/30/2020 74.24 >60.00 mL/min Final    Comment:    Calculated using the CKD-EPI Creatinine Equation (2021)   eGFR  Date Value Ref Range Status  07/26/2023 106 > OR = 60 mL/min/1.65m2 Final         Passed - Patient is not pregnant      Passed - Last BP in normal range    BP Readings from Last 1 Encounters:  04/13/23 108/68         Passed - Valid encounter within last 6 months    Recent Outpatient Visits           4 months ago Type 2 diabetes mellitus with hyperglycemia, without long-term current use of insulin [facility])   [place] [facility] [place], [place], NP   7 months ago Type 2  diabetes mellitus with hyperglycemia, without long-term current use of insulin [facility])   [place] [facility] [place], Salvadore Oxford, NP   8 months ago Encounter for general adult medical examination with abnormal findings   [place] [facility] [place], [place], NP   10 months ago Hearing loss of both ears due to cerumen impaction   [place] [facility] [place], Salvadore Oxford, NP   1 year ago Prediabetes   [place] [facility] [place], Salvadore Oxford, NP       Future Appointments             In 4 months Baity, Salvadore Oxford, NP [place] [facility], [facility]

## 2023-12-09 ENCOUNTER — Other Ambulatory Visit: Payer: Self-pay | Admitting: Internal Medicine

## 2023-12-13 NOTE — Telephone Encounter (Signed)
 Requested Prescriptions  Pending Prescriptions Disp Refills   nortriptyline  (PAMELOR ) 50 MG capsule [Pharmacy Med Name: NORTRIPTYLINE  HCL 50 MG CAP] 90 capsule 0    Sig: TAKE 1 CAPSULE BY MOUTH EVERYDAY AT BEDTIME     Psychiatry:  Antidepressants - Heterocyclics (TCAs) Passed - 12/13/2023 12:47 PM      Passed - Valid encounter within last 6 months    Recent Outpatient Visits           4 months ago Type 2 diabetes mellitus with hyperglycemia, without long-term current use of insulin  [facility])   [place] [facility] [place], [place] W, NP   8 months ago Type 2 diabetes mellitus with hyperglycemia, without long-term current use of insulin  [facility])   [place] [facility] [place], Angeline ORN, NP   8 months ago Encounter for general adult medical examination with abnormal findings   [place] [facility] [place], [place], NP   10 months ago Hearing loss of both ears due to cerumen impaction   [place] [facility] [place], Angeline ORN, NP   1 year ago Prediabetes   [place] [facility] [place], Angeline ORN, NP       Future Appointments             In 4 months Baity, Angeline ORN, NP [place] [facility]

## 2023-12-14 ENCOUNTER — Telehealth: Payer: Self-pay | Admitting: Gastroenterology

## 2023-12-14 NOTE — Telephone Encounter (Signed)
/  Patient needs appointment. Has not been seen since 05/17/2022. Please make appointment before it can be refilled

## 2023-12-14 NOTE — Telephone Encounter (Signed)
 Pt has appointment scheduled for 02/13/2024 with Dr. Allegra Lai

## 2023-12-14 NOTE — Telephone Encounter (Signed)
 Pt requesting refill on ompeprazole 40 mg

## 2023-12-14 NOTE — Telephone Encounter (Signed)
 Pt requesting refill on omeprazole 40 [facility]

## 2023-12-16 ENCOUNTER — Other Ambulatory Visit: Payer: Self-pay | Admitting: Gastroenterology

## 2023-12-17 NOTE — Telephone Encounter (Signed)
 Last office visit 05/17/2022 abdominal pain  Last refill 06/25/2023  60 2 refills  Canceled appointment on 10/03/2023 09/10/2023 05/10/2023 Schedule appointment on 02/13/2024

## 2024-02-13 ENCOUNTER — Ambulatory Visit: Payer: Managed Care, Other (non HMO) | Admitting: Gastroenterology

## 2024-02-21 ENCOUNTER — Encounter: Payer: Self-pay | Admitting: Internal Medicine

## 2024-02-21 MED ORDER — NURTEC 75 MG PO TBDP: 75.0000 mg | ORAL_TABLET | Freq: Every day | ORAL | 2 refills | Status: DC | PRN

## 2024-02-22 ENCOUNTER — Telehealth: Payer: Self-pay

## 2024-02-22 NOTE — Telephone Encounter (Signed)
  Approved today by [facility] 2017 CaseId:[id number];Status:Approved;Review Type:Prior Auth;Coverage Start Date:02/22/2024;Coverage End Date:02/21/2025; Effective Date: 02/22/2024 Authorization Expiration Date: 02/21/2025

## 2024-02-22 NOTE — Telephone Encounter (Signed)
 Kenneth Sherman (KeyIrving Sherman) Rx #: [id number] Need Help? Call us at [phone] Status New (Not sent to plan) Drug Nurtec 75MG  dispersible tablets ePA cloud logo Chief Operating Officer PA Form [phone] NCPDP) Original Claim Info 51

## 2024-03-11 ENCOUNTER — Other Ambulatory Visit: Payer: Self-pay | Admitting: Urology

## 2024-03-12 ENCOUNTER — Other Ambulatory Visit: Payer: Self-pay | Admitting: Internal Medicine

## 2024-03-12 ENCOUNTER — Encounter: Payer: Self-pay | Admitting: Internal Medicine

## 2024-03-12 ENCOUNTER — Other Ambulatory Visit: Payer: Self-pay | Admitting: Cardiology

## 2024-03-12 DIAGNOSIS — R0609 Other forms of dyspnea: Secondary | ICD-10-CM

## 2024-03-12 DIAGNOSIS — Z01810 Encounter for preprocedural cardiovascular examination: Secondary | ICD-10-CM

## 2024-03-12 DIAGNOSIS — I10 Essential (primary) hypertension: Secondary | ICD-10-CM

## 2024-03-12 MED ORDER — METOPROLOL SUCCINATE ER 25 MG PO TB24: 25.0000 mg | ORAL_TABLET | Freq: Every morning | ORAL | 0 refills | Status: DC

## 2024-03-13 ENCOUNTER — Telehealth: Payer: Self-pay | Admitting: Urology

## 2024-03-13 DIAGNOSIS — N529 Male erectile dysfunction, unspecified: Secondary | ICD-10-CM

## 2024-03-13 MED ORDER — TADALAFIL 10 MG PO TABS: 10.0000 mg | ORAL_TABLET | Freq: Every day | ORAL | 0 refills | Status: DC | PRN

## 2024-03-13 MED ORDER — NORTRIPTYLINE HCL 50 MG PO CAPS
50.0000 mg | ORAL_CAPSULE | Freq: Every day | ORAL | 0 refills | Status: DC
Start: 2024-03-13 — End: 2024-04-11

## 2024-03-13 NOTE — Telephone Encounter (Signed)
Short term supply sent to the pharmacy.

## 2024-03-13 NOTE — Telephone Encounter (Signed)
 Pt called pharmacy to request a refill for Tadalafil and they told him he needed to schedule an appt.  He is completely out.  He scheduled an appt for 4/16 and wants to know if we will send in a couple weeks to last until his appt.  [facility] [address]

## 2024-03-14 ENCOUNTER — Encounter: Payer: Self-pay | Admitting: Internal Medicine

## 2024-03-26 ENCOUNTER — Ambulatory Visit (INDEPENDENT_AMBULATORY_CARE_PROVIDER_SITE_OTHER): Admitting: Urology

## 2024-03-26 VITALS — BP 154/95 | HR 67 | Ht 71.0 in | Wt 261.6 lb

## 2024-03-26 DIAGNOSIS — N529 Male erectile dysfunction, unspecified: Secondary | ICD-10-CM

## 2024-03-26 MED ORDER — TADALAFIL 5 MG PO TABS: 5.0000 mg | ORAL_TABLET | Freq: Every day | ORAL | 6 refills | Status: DC

## 2024-03-26 NOTE — Progress Notes (Signed)
   03/26/2024 11:07 AM   Kenneth Sherman 1971-06-22 [id number]  Reason for visit: Follow up ED, history of low testosterone  HPI: 54 year old male who was previously followed by Matilde Son for low testosterone and previously on exogenous testosterone-he had a biceps tendon tear that was attributed to testosterone replacement and he stopped those medications.  No recent testosterone levels to review.  He had a vasectomy with me in March 2024, and was having some persistent problems with ED despite sildenafil, and he was changed to Cialis at that visit.  Here today for refill of Cialis.  He also reports some urinary frequency.  I recommended changing the Cialis dosing to 5 mg daily which may improve his urinary symptoms, and taking a 10 to 15 mg boost dose as needed 1 hour prior to sexual activity.  Risk and benefits were discussed.  Could also consider rechecking a testosterone in the future now that he has lost weight and made some lifestyle changes.  Needs a repeat post vas semen analysis, initial specimen showed persistent sperm present  Cialis changed to 5 mg daily with 10 to 15 mg boost dose as needed, consider repeat testosterone lab work in the future if persistent ED Repeat post vasectomy semen analysis drop-off scheduled RTC 1 year, sooner if problems   Lawerence Pressman, MD  [facility] [address], [address] [place], [place] [address] [phone]

## 2024-03-31 ENCOUNTER — Encounter: Payer: Self-pay | Admitting: Internal Medicine

## 2024-03-31 ENCOUNTER — Other Ambulatory Visit: Payer: Self-pay | Admitting: Family Medicine

## 2024-03-31 ENCOUNTER — Other Ambulatory Visit: Payer: Self-pay | Admitting: Internal Medicine

## 2024-03-31 DIAGNOSIS — Z0000 Encounter for general adult medical examination without abnormal findings: Secondary | ICD-10-CM

## 2024-03-31 MED ORDER — LISINOPRIL-HYDROCHLOROTHIAZIDE 10-12.5 MG PO TABS: 1.0000 | ORAL_TABLET | Freq: Every day | ORAL | 0 refills | Status: DC

## 2024-04-10 ENCOUNTER — Other Ambulatory Visit: Payer: Self-pay | Admitting: Cardiology

## 2024-04-10 DIAGNOSIS — Z01810 Encounter for preprocedural cardiovascular examination: Secondary | ICD-10-CM

## 2024-04-10 DIAGNOSIS — I10 Essential (primary) hypertension: Secondary | ICD-10-CM

## 2024-04-10 DIAGNOSIS — R0609 Other forms of dyspnea: Secondary | ICD-10-CM

## 2024-04-11 ENCOUNTER — Ambulatory Visit: Payer: Self-pay | Admitting: Internal Medicine

## 2024-04-11 ENCOUNTER — Encounter: Payer: Self-pay | Admitting: Internal Medicine

## 2024-04-11 VITALS — BP 122/68 | Ht 71.0 in | Wt 258.0 lb

## 2024-04-11 DIAGNOSIS — Z6835 Body mass index (BMI) 35.0-35.9, adult: Secondary | ICD-10-CM

## 2024-04-11 DIAGNOSIS — E66812 Obesity, class 2: Secondary | ICD-10-CM | POA: Diagnosis not present

## 2024-04-11 DIAGNOSIS — Z125 Encounter for screening for malignant neoplasm of prostate: Secondary | ICD-10-CM | POA: Diagnosis not present

## 2024-04-11 DIAGNOSIS — Z0001 Encounter for general adult medical examination with abnormal findings: Secondary | ICD-10-CM | POA: Diagnosis not present

## 2024-04-11 DIAGNOSIS — E1165 Type 2 diabetes mellitus with hyperglycemia: Secondary | ICD-10-CM | POA: Diagnosis not present

## 2024-04-11 MED ORDER — NORTRIPTYLINE HCL 75 MG PO CAPS: 75.0000 mg | ORAL_CAPSULE | Freq: Every day | ORAL | 1 refills | Status: DC

## 2024-04-11 MED ORDER — CELECOXIB 200 MG PO CAPS: 200.0000 mg | ORAL_CAPSULE | Freq: Every day | ORAL | 1 refills | Status: DC

## 2024-04-11 MED ORDER — SUMATRIPTAN SUCCINATE 25 MG PO TABS: 25.0000 mg | ORAL_TABLET | ORAL | 0 refills | Status: DC | PRN

## 2024-04-11 NOTE — Patient Instructions (Signed)
 Health Maintenance, Male  Adopting a healthy lifestyle and getting preventive care are important in promoting health and wellness. Ask your health care provider about:  The right schedule for you to have regular tests and exams.  Things you can do on your own to prevent diseases and keep yourself healthy.  What should I know about diet, weight, and exercise?  Eat a healthy diet    Eat a diet that includes plenty of vegetables, fruits, low-fat dairy products, and lean protein.  Do not eat a lot of foods that are high in solid fats, added sugars, or sodium.  Maintain a healthy weight  Body mass index (BMI) is a measurement that can be used to identify possible weight problems. It estimates body fat based on height and weight. Your health care provider can help determine your BMI and help you achieve or maintain a healthy weight.  Get regular exercise  Get regular exercise. This is one of the most important things you can do for your health. Most adults should:  Exercise for at least 150 minutes each week. The exercise should increase your heart rate and make you sweat (moderate-intensity exercise).  Do strengthening exercises at least twice a week. This is in addition to the moderate-intensity exercise.  Spend less time sitting. Even light physical activity can be beneficial.  Watch cholesterol and blood lipids  Have your blood tested for lipids and cholesterol at 54 years of age, then have this test every 5 years.  You may need to have your cholesterol levels checked more often if:  Your lipid or cholesterol levels are high.  You are older than 54 years of age.  You are at high risk for heart disease.  What should I know about cancer screening?  Many types of cancers can be detected early and may often be prevented. Depending on your health history and family history, you may need to have cancer screening at various ages. This may include screening for:  Colorectal cancer.  Prostate cancer.  Skin cancer.  Lung  cancer.  What should I know about heart disease, diabetes, and high blood pressure?  Blood pressure and heart disease  High blood pressure causes heart disease and increases the risk of stroke. This is more likely to develop in people who have high blood pressure readings or are overweight.  Talk with your health care provider about your target blood pressure readings.  Have your blood pressure checked:  Every 3-5 years if you are 54-95 years of age.  Every year if you are 54 years old or older.  If you are between the ages of 29 and 29 and are a current or former smoker, ask your health care provider if you should have a one-time screening for abdominal aortic aneurysm (AAA).  Diabetes  Have regular diabetes screenings. This checks your fasting blood sugar level. Have the screening done:  Once every three years after age 23 if you are at a normal weight and have a low risk for diabetes.  More often and at a younger age if you are overweight or have a high risk for diabetes.  What should I know about preventing infection?  Hepatitis B  If you have a higher risk for hepatitis B, you should be screened for this virus. Talk with your health care provider to find out if you are at risk for hepatitis B infection.  Hepatitis C  Blood testing is recommended for:  Everyone born from [birth year] through 1966.  Anyone  with known risk factors for hepatitis C.  Sexually transmitted infections (STIs)  You should be screened each year for STIs, including gonorrhea and chlamydia, if:  You are sexually active and are younger than 54 years of age.  You are older than 54 years of age and your health care provider tells you that you are at risk for this type of infection.  Your sexual activity has changed since you were last screened, and you are at increased risk for chlamydia or gonorrhea. Ask your health care provider if you are at risk.  Ask your health care provider about whether you are at high risk for HIV. Your health care provider  may recommend a prescription medicine to help prevent HIV infection. If you choose to take medicine to prevent HIV, you should first get tested for HIV. You should then be tested every 3 months for as long as you are taking the medicine.  Follow these instructions at home:  Alcohol use  Do not drink alcohol if your health care provider tells you not to drink.  If you drink alcohol:  Limit how much you have to 0-2 drinks a day.  Know how much alcohol is in your drink. In the [place] one drink equals one 12 oz bottle of beer (355 mL), one 5 oz glass of wine (148 mL), or one 1 oz glass of hard liquor (44 mL).  Lifestyle  Do not use any products that contain nicotine or tobacco. These products include cigarettes, chewing tobacco, and vaping devices, such as e-cigarettes. If you need help quitting, ask your health care provider.  Do not use street drugs.  Do not share needles.  Ask your health care provider for help if you need support or information about quitting drugs.  General instructions  Schedule regular health, dental, and eye exams.  Stay current with your vaccines.  Tell your health care provider if:  You often feel depressed.  You have ever been abused or do not feel safe at home.  Summary  Adopting a healthy lifestyle and getting preventive care are important in promoting health and wellness.  Follow your health care provider's instructions about healthy diet, exercising, and getting tested or screened for diseases.  Follow your health care provider's instructions on monitoring your cholesterol and blood pressure.  This information is not intended to replace advice given to you by your health care provider. Make sure you discuss any questions you have with your health care provider.  Document Revised: 04/18/2021 Document Reviewed: 04/18/2021  Elsevier Patient Education  2024 [employer].

## 2024-04-11 NOTE — Assessment & Plan Note (Signed)
 Encouraged continued diet and exercise for weight loss

## 2024-04-11 NOTE — Progress Notes (Unsigned)
 Subjective:    Patient ID: Kenneth Sherman, male    DOB: Oct 08, 1971, 53 y.o.   MRN: [id number]  HPI  Patient presents to clinic today for his annual exam.  Flu: Never Tetanus: 11/2012 COVID: Pfizer x 3 Pneumovax: Never Shingrix: Never PSA screening: 03/2023 Colon screening: 03/2021 Vision screening: annually Dentist: biannually  Diet: He does eat meat. He consumes fruits and veggies. He does eat some fried foods. He drinks mostly water, sweet tea. Exercise: stationary bike  Review of Systems     Past Medical History:  Diagnosis Date   Arthritis    Coronary atherosclerosis due to calcified coronary lesion    GERD (gastroesophageal reflux disease)    Heartburn    HLD (hyperlipidemia)    Hypertension    no meds   Hypogonadism in male    Right knee DJD    Thyroid  disease     Current Outpatient Medications  Medication Sig Dispense Refill   atorvastatin  (LIPITOR) 10 MG tablet TAKE 1 TABLET BY MOUTH EVERY DAY 90 tablet 1   celecoxib  (CELEBREX ) 200 MG capsule Take 1 capsule (200 mg total) by mouth daily. 90 capsule 0   ezetimibe  (ZETIA ) 10 MG tablet TAKE 1 TABLET BY MOUTH EVERY DAY 90 tablet 1   lisinopril -hydrochlorothiazide  (ZESTORETIC ) 10-12.5 MG tablet Take 1 tablet by mouth daily. 90 tablet 0   metoprolol  succinate (TOPROL -XL) 25 MG 24 hr tablet Take 1 tablet (25 mg total) by mouth in the morning. 30 tablet 0   nortriptyline  (PAMELOR ) 50 MG capsule Take 1 capsule (50 mg total) by mouth at bedtime. 90 capsule 0   NURTEC 75 MG TBDP Take 1 tablet (75 mg total) by mouth daily as needed (migraine headache). Max 1 tablet in 24 hours. 8 tablet 2   omeprazole  (PRILOSEC) 40 MG capsule Take 1 capsule (40 mg total) by mouth daily before breakfast. 180 capsule 1   tadalafil  (CIALIS ) 5 MG tablet Take 1 tablet (5 mg total) by mouth daily. Take 5 mg daily, ok to take additional 10-15mg  boost dose 1 hour prior to sexual activity as needed 90 tablet 6   No current facility-administered  medications for this visit.    No Known Allergies  Family History  Problem Relation Age of Onset   Heart attack Mother    Hypertension Mother    Hypertension Father    Heart attack Father    Hypertension Brother    Healthy Brother    Healthy Brother    Healthy Brother    Kidney disease Neg Hx    Prostate cancer Neg Hx    Bladder Cancer Neg Hx    Colon cancer Neg Hx     Social History   Socioeconomic History   Marital status: Married    Spouse name: Not on file   Number of children: Not on file   Years of education: 14   Highest education level: Associate degree: occupational, Scientist, product/process development, or vocational program  Occupational History   Occupation: CAD Copywriter, advertising: [employer]  Tobacco Use   Smoking status: Never   Smokeless tobacco: Never  Vaping Use   Vaping status: Never Used  Substance and Sexual Activity   Alcohol use: Yes    Alcohol/week: 2.0 standard drinks of alcohol    Types: 1 Glasses of wine, 1 Cans of beer per week    Comment: occasionally   Drug use: No   Sexual activity: Yes    Birth control/protection: Condom  Other Topics  Concern   Not on file  Social History Narrative   Regular exercise-no   Caffeine  Use-yes   Social Drivers of Health   Financial Resource Strain: Low Risk  (04/09/2024)   Overall Financial Resource Strain (CARDIA)    Difficulty of Paying Living Expenses: Not hard at all  Food Insecurity: No Food Insecurity (04/09/2024)   Hunger Vital Sign    Worried About Running Out of Food in the Last Year: Never true    Ran Out of Food in the Last Year: Never true  Transportation Needs: No Transportation Needs (04/09/2024)   PRAPARE - Administrator, Civil Service (Medical): No    Lack of Transportation (Non-Medical): No  Physical Activity: Insufficiently Active (04/09/2024)   Exercise Vital Sign    Days of Exercise per Week: 3 days    Minutes of Exercise per Session: 20 min  Stress: No Stress Concern Present  (04/09/2024)   [employer] of Occupational Health - Occupational Stress Questionnaire    Feeling of Stress : Not at all  Social Connections: Moderately Isolated (04/09/2024)   Social Connection and Isolation Panel [NHANES]    Frequency of Communication with Friends and Family: More than three times a week    Frequency of Social Gatherings with Friends and Family: Three times a week    Attends Religious Services: Never    Active Member of Clubs or Organizations: No    Attends Banker Meetings: Not on file    Marital Status: Married  Intimate Partner Violence: Unknown (03/17/2022)   Received from [employer], [facility]   HITS    Physically Hurt: Not on file    Insult or Talk Down To: Not on file    Threaten Physical Harm: Not on file    Scream or Curse: Not on file     Constitutional: Patient reports intermittent headaches.  Denies fever, malaise, fatigue, or abrupt weight changes.  HEENT: Denies eye pain, eye redness, ear pain, ringing in the ears, wax buildup, runny nose, nasal congestion, bloody nose, or sore throat. Respiratory: Denies difficulty breathing, shortness of breath, cough or sputum production.   Cardiovascular: Denies chest pain, chest tightness, palpitations or swelling in the hands or feet.  Gastrointestinal: Denies abdominal pain, bloating, constipation, diarrhea or blood in the stool.  GU: Patient reports erectile dysfunction.  Denies urgency, frequency, pain with urination, burning sensation, blood in urine, odor or discharge. Musculoskeletal: Patient reports joint pain in knees, right elbow and hand pain.  Denies decrease in range of motion, difficulty with gait, muscle pain or joint swelling.  Skin: Denies redness, rashes, lesions or ulcercations.  Neurological: Denies dizziness, difficulty with memory, difficulty with speech or problems with balance and coordination.  Psych: Patient has a history of anxiety.  Denies depression, SI/HI.  No  other specific complaints in a complete review of systems (except as listed in HPI above).  Objective:   Physical Exam  BP 122/68 (BP Location: Left Arm, Patient Position: Sitting, Cuff Size: Large)   Ht 5\' 11"  (1.803 m)   Wt 258 lb (117 kg)   BMI 35.98 kg/m     Wt Readings from Last 3 Encounters:  03/26/24 261 lb 9.6 oz (118.7 kg)  07/20/23 256 lb (116.1 kg)  04/13/23 (!) 305 lb (138.3 kg)    General: Appears his stated age, obese in NAD. Skin: Warm, dry and intact.  HEENT: Head: normal shape and size; Eyes: sclera white, no icterus, conjunctiva pink, PERRLA and EOMs intact;  Neck:  Neck supple, trachea midline. No masses, lumps or thyromegaly present.  Cardiovascular: Normal rate and rhythm. S1,S2 noted.  No murmur, rubs or gallops noted. No JVD. Trace BLE edema. No carotid bruits noted. Pulmonary/Chest: Normal effort and positive vesicular breath sounds. No respiratory distress. No wheezes, rales or ronchi noted.  Abdomen: Normal bowel sounds.  Musculoskeletal: Pain with palpation at the right lateral epicondyle. Enlargement noted of bilateral CMC. Strength 5/5 BUE/BLE.  No difficulty with gait.  Neurological: Alert and oriented. Cranial nerves II-XII grossly intact. Coordination normal.  Psychiatric: Mood and affect normal. Behavior is normal. Judgment and thought content normal.    BMET    Component Value Date/Time   NA 139 07/26/2023 0837   K 4.6 07/26/2023 0837   CL 98 07/26/2023 0837   CO2 30 07/26/2023 0837   GLUCOSE 97 07/26/2023 0837   BUN 17 07/26/2023 0837   CREATININE 0.80 07/26/2023 0837   CALCIUM  9.6 07/26/2023 0837   GFRNONAA >60 06/11/2018 1018   GFRAA >60 06/11/2018 1018    Lipid Panel     Component Value Date/Time   CHOL 146 07/26/2023 0837   TRIG 78 07/26/2023 0837   HDL 45 07/26/2023 0837   CHOLHDL 3.2 07/26/2023 0837   VLDL 19.8 03/26/2020 0849   LDLCALC 84 07/26/2023 0837    CBC    Component Value Date/Time   WBC 9.2 04/06/2023 1344    RBC 5.04 04/06/2023 1344   HGB 13.7 04/06/2023 1344   HGB 14.8 12/07/2017 0847   HCT 40.3 04/06/2023 1344   HCT 43.4 12/07/2017 0847   PLT 301 04/06/2023 1344   MCV 80.0 04/06/2023 1344   MCH 27.2 04/06/2023 1344   MCHC 34.0 04/06/2023 1344   RDW 13.9 04/06/2023 1344   LYMPHSABS 2.2 12/30/2020 1600   MONOABS 0.5 12/30/2020 1600   EOSABS 0.3 12/30/2020 1600   BASOSABS 0.1 12/30/2020 1600    Hgb A1C Lab Results  Component Value Date   HGBA1C 6.0 (H) 07/26/2023           Assessment & Plan:   Preventative Health Maintenance:  Encouraged him to get a flu shot in the fall Tetanus declined COVID-vaccine UTD Discussed Shingrix vaccine, he will check coverage with his insurance company and schedule visit if he would like to have this done Colon screening UTD Encouraged him to consume a balanced diet and exercise regimen Advised him to see an eye doctor and dentist annually Will check CBC, c-Met, lipid, A1c and PSA today  RTC in 6 months, follow-up chronic conditions Helayne Lo, NP

## 2024-04-12 LAB — CBC
HCT: 39.2 % (ref 38.5–50.0)
Hemoglobin: 13.2 g/dL (ref 13.2–17.1)
MCH: 28.5 pg (ref 27.0–33.0)
MCHC: 33.7 g/dL (ref 32.0–36.0)
MCV: 84.7 fL (ref 80.0–100.0)
MPV: 9.1 fL (ref 7.5–12.5)
Platelets: 262 10*3/uL (ref 140–400)
RBC: 4.63 10*6/uL (ref 4.20–5.80)
RDW: 13.2 % (ref 11.0–15.0)
WBC: 9.3 10*3/uL (ref 3.8–10.8)

## 2024-04-12 LAB — COMPREHENSIVE METABOLIC PANEL WITH GFR
AG Ratio: 2 (calc) (ref 1.0–2.5)
ALT: 23 U/L (ref 9–46)
AST: 20 U/L (ref 10–35)
Albumin: 4.7 g/dL (ref 3.6–5.1)
Alkaline phosphatase (APISO): 61 U/L (ref 35–144)
BUN: 25 mg/dL (ref 7–25)
CO2: 29 mmol/L (ref 20–32)
Calcium: 9.2 mg/dL (ref 8.6–10.3)
Chloride: 102 mmol/L (ref 98–110)
Creat: 1.05 mg/dL (ref 0.70–1.30)
Globulin: 2.3 g/dL (ref 1.9–3.7)
Glucose, Bld: 94 mg/dL (ref 65–99)
Potassium: 4.3 mmol/L (ref 3.5–5.3)
Sodium: 138 mmol/L (ref 135–146)
Total Bilirubin: 0.3 mg/dL (ref 0.2–1.2)
Total Protein: 7 g/dL (ref 6.1–8.1)
eGFR: 85 mL/min/{1.73_m2} (ref >=60)

## 2024-04-12 LAB — LIPID PANEL
Cholesterol: 137 mg/dL (ref <200)
HDL: 61 mg/dL (ref >=40)
LDL Cholesterol (Calc): 63 mg/dL
Non-HDL Cholesterol (Calc): 76 mg/dL (ref <130)
Total CHOL/HDL Ratio: 2.2 (calc) (ref <5.0)
Triglycerides: 52 mg/dL (ref <150)

## 2024-04-12 LAB — HEMOGLOBIN A1C
Hgb A1c MFr Bld: 5.8 % — ABNORMAL HIGH (ref <5.7)
Mean Plasma Glucose: 120 mg/dL
eAG (mmol/L): 6.6 mmol/L

## 2024-04-12 LAB — PSA: PSA: 2.09 ng/mL (ref <=4.00)

## 2024-04-14 ENCOUNTER — Encounter: Payer: Self-pay | Admitting: Internal Medicine

## 2024-04-24 ENCOUNTER — Other Ambulatory Visit: Payer: Self-pay | Admitting: Internal Medicine

## 2024-04-25 NOTE — Telephone Encounter (Signed)
 Requested Prescriptions  Pending Prescriptions Disp Refills   ezetimibe  (ZETIA ) 10 MG tablet [Pharmacy Med Name: EZETIMIBE  10 MG TABLET] 90 tablet 1    Sig: TAKE 1 TABLET BY MOUTH EVERY DAY     Cardiovascular:  Antilipid - Sterol Transport Inhibitors Failed - 04/25/2024 11:41 AM      Failed - Lipid Panel in normal range within the last 12 months    Cholesterol  Date Value Ref Range Status  04/11/2024 137 <200 mg/dL Final   LDL Cholesterol (Calc)  Date Value Ref Range Status  04/11/2024 63 mg/dL (calc) Final    Comment:    Reference range: <100 . Desirable range <100 mg/dL for primary prevention;   <70 mg/dL for patients with CHD or diabetic patients  with > or = 2 CHD risk factors. Aaron Aas LDL-C is now calculated using the Martin-Hopkins  calculation, which is a validated novel method providing  better accuracy than the Friedewald equation in the  estimation of LDL-C.  Melinda Sprawls et al. Erroll Heard. 1610;960(45): 2061-2068  (http://education.[facility].com/faq/FAQ164)    Direct LDL  Date Value Ref Range Status  06/04/2018 157.0 mg/dL Final    Comment:    Optimal:  <100 mg/dLNear or Above Optimal:  100-129 mg/dLBorderline High:  130-159 mg/dLHigh:  160-189 mg/dLVery High:  >190 mg/dL   HDL  Date Value Ref Range Status  04/11/2024 61 > OR = 40 mg/dL Final   Triglycerides  Date Value Ref Range Status  04/11/2024 52 <150 mg/dL Final         Passed - AST in normal range and within 360 days    AST  Date Value Ref Range Status  04/11/2024 20 10 - 35 U/L Final         Passed - ALT in normal range and within 360 days    ALT  Date Value Ref Range Status  04/11/2024 23 9 - 46 U/L Final         Passed - Patient is not pregnant      Passed - Valid encounter within last 12 months    Recent Outpatient Visits           2 weeks ago Encounter for general adult medical examination with abnormal findings   [place] [facility] [place], Rankin Buzzard, NP        Future Appointments             In 11 months Lawerence Pressman, MD [facility] [place]             atorvastatin  (LIPITOR) 10 MG tablet [Pharmacy Med Name: ATORVASTATIN  10 MG TABLET] 90 tablet 1    Sig: TAKE 1 TABLET BY MOUTH EVERY DAY     Cardiovascular:  Antilipid - Statins Failed - 04/25/2024 11:41 AM      Failed - Lipid Panel in normal range within the last 12 months    Cholesterol  Date Value Ref Range Status  04/11/2024 137 <200 mg/dL Final   LDL Cholesterol (Calc)  Date Value Ref Range Status  04/11/2024 63 mg/dL (calc) Final    Comment:    Reference range: <100 . Desirable range <100 mg/dL for primary prevention;   <70 mg/dL for patients with CHD or diabetic patients  with > or = 2 CHD risk factors. Aaron Aas LDL-C is now calculated using the Martin-Hopkins  calculation, which is a validated novel method providing  better accuracy than the Friedewald equation in the  estimation of LDL-C.  Melinda Sprawls  et al. JAMA. 9604;540(98): 2061-2068  (http://education.[facility].com/faq/FAQ164)    Direct LDL  Date Value Ref Range Status  06/04/2018 157.0 mg/dL Final    Comment:    Optimal:  <100 mg/dLNear or Above Optimal:  100-129 mg/dLBorderline High:  130-159 mg/dLHigh:  160-189 mg/dLVery High:  >190 mg/dL   HDL  Date Value Ref Range Status  04/11/2024 61 > OR = 40 mg/dL Final   Triglycerides  Date Value Ref Range Status  04/11/2024 52 <150 mg/dL Final         Passed - Patient is not pregnant      Passed - Valid encounter within last 12 months    Recent Outpatient Visits           2 weeks ago Encounter for general adult medical examination with abnormal findings   [place] [facility] [place], Rankin Buzzard, NP       Future Appointments             In 11 months Estanislao Heimlich, Dennard Fisher, MD [facility] [place]

## 2024-05-08 ENCOUNTER — Other Ambulatory Visit: Payer: Self-pay | Admitting: Internal Medicine

## 2024-05-09 ENCOUNTER — Encounter: Payer: Self-pay | Admitting: Internal Medicine

## 2024-05-09 NOTE — Telephone Encounter (Signed)
 Requested Prescriptions  Pending Prescriptions Disp Refills   SUMAtriptan  (IMITREX ) 25 MG tablet [Pharmacy Med Name: SUMATRIPTAN  SUCC 25 MG TABLET] 10 tablet 0    Sig: TAKE 1 TABLET BY MOUTH AS NEEDED FOR MIGRAINE. MAY REPEAT IN 2 HOURS IF HEADACHE PERSISTS OR RECURS.     Neurology:  Migraine Therapy - Triptan Passed - 05/09/2024  2:39 PM      Passed - Last BP in normal range    BP Readings from Last 1 Encounters:  04/11/24 122/68         Passed - Valid encounter within last 12 months    Recent Outpatient Visits           4 weeks ago Encounter for general adult medical examination with abnormal findings   [place] [facility] [place], Rankin Buzzard, NP       Future Appointments             In 10 months Estanislao Heimlich, Dennard Fisher, MD [facility] [place]

## 2024-05-13 ENCOUNTER — Encounter: Payer: Self-pay | Admitting: Internal Medicine

## 2024-05-13 ENCOUNTER — Ambulatory Visit: Admitting: Internal Medicine

## 2024-05-13 VITALS — BP 116/80 | Ht 71.0 in | Wt 264.2 lb

## 2024-05-13 DIAGNOSIS — M5441 Lumbago with sciatica, right side: Secondary | ICD-10-CM

## 2024-05-13 MED ORDER — KETOROLAC TROMETHAMINE 30 MG/ML IJ SOLN
30.0000 mg | Freq: Once | INTRAMUSCULAR | Status: AC
  Administered 2024-05-13: 30 mg via INTRAMUSCULAR

## 2024-05-13 MED ORDER — CYCLOBENZAPRINE HCL 10 MG PO TABS: 10.0000 mg | ORAL_TABLET | Freq: Three times a day (TID) | ORAL | 0 refills | Status: DC | PRN

## 2024-05-13 MED ORDER — PREDNISONE 10 MG PO TABS
ORAL_TABLET | ORAL | 0 refills | Status: DC
Start: 2024-05-13 — End: 2024-09-17

## 2024-05-13 NOTE — Progress Notes (Signed)
 Subjective:    Patient ID: Kenneth Sherman, male    DOB: 10-07-1971, 53 y.o.   MRN: [id number]  HPI  Discussed the use of AI scribe software for clinical note transcription with the patient, who gave verbal consent to proceed.  He is a 53 year old male who presents with acute lower back pain radiating to the right leg.  He has been experiencing sharp pain in his lower back on the right side for the past week, radiating down his buttock and extending to his calf, particularly when driving. The pain is excruciating when standing, and sitting or lying down does not alleviate it.  He has tried ibuprofen, taking four to five tablets a day, and hydrocodone, but neither provided significant relief. Heat application helps somewhat, but ice was less effective. He uses ibuprofen PM and hydrocodone to aid sleep, but these have not been effective.  No numbness, tingling, or bowel and bladder issues are reported. He has no significant history of back problems, and a lumbar spine x-ray from November 2020 was normal. Sitting is unbearable, and he experiences sharp pain when lifting his right leg while sitting in a recliner.       Review of Systems     Past Medical History:  Diagnosis Date   Arthritis    Coronary atherosclerosis due to calcified coronary lesion    GERD (gastroesophageal reflux disease)    Heartburn    HLD (hyperlipidemia)    Hypertension    no meds   Hypogonadism in male    Right knee DJD    Thyroid  disease     Current Outpatient Medications  Medication Sig Dispense Refill   atorvastatin  (LIPITOR) 10 MG tablet TAKE 1 TABLET BY MOUTH EVERY DAY 90 tablet 1   celecoxib  (CELEBREX ) 200 MG capsule Take 1 capsule (200 mg total) by mouth daily. 90 capsule 1   ezetimibe  (ZETIA ) 10 MG tablet TAKE 1 TABLET BY MOUTH EVERY DAY 90 tablet 1   lisinopril -hydrochlorothiazide  (ZESTORETIC ) 10-12.5 MG tablet Take 1 tablet by mouth daily. 90 tablet 0   metoprolol  succinate (TOPROL -XL) 25 MG 24 hr  tablet Take 1 tablet (25 mg total) by mouth in the morning. Please call [phone] to schedule an overdue appointment for future refills. Thank you. 2nd Attempt. 15 tablet 0   nortriptyline  (PAMELOR ) 75 MG capsule Take 1 capsule (75 mg total) by mouth at bedtime. 90 capsule 1   omeprazole  (PRILOSEC) 40 MG capsule Take 1 capsule (40 mg total) by mouth daily before breakfast. 180 capsule 1   SUMAtriptan  (IMITREX ) 25 MG tablet TAKE 1 TABLET BY MOUTH AS NEEDED FOR MIGRAINE. MAY REPEAT IN 2 HOURS IF HEADACHE PERSISTS OR RECURS. 10 tablet 0   tadalafil  (CIALIS ) 5 MG tablet Take 1 tablet (5 mg total) by mouth daily. Take 5 mg daily, ok to take additional 10-15mg  boost dose 1 hour prior to sexual activity as needed 90 tablet 6   No current facility-administered medications for this visit.    No Known Allergies  Family History  Problem Relation Age of Onset   Heart attack Mother    Hypertension Mother    Hypertension Father    Heart attack Father    Hypertension Brother    Healthy Brother    Healthy Brother    Healthy Brother    Kidney disease Neg Hx    Prostate cancer Neg Hx    Bladder Cancer Neg Hx    Colon cancer Neg Hx     Social  History   Socioeconomic History   Marital status: Married    Spouse name: Not on file   Number of children: Not on file   Years of education: 14   Highest education level: Associate degree: occupational, Scientist, product/process development, or vocational program  Occupational History   Occupation: CAD Copywriter, advertising: [employer]  Tobacco Use   Smoking status: Never   Smokeless tobacco: Never  Vaping Use   Vaping status: Never Used  Substance and Sexual Activity   Alcohol use: Yes    Alcohol/week: 2.0 standard drinks of alcohol    Types: 1 Glasses of wine, 1 Cans of beer per week    Comment: occasionally   Drug use: No   Sexual activity: Yes    Birth control/protection: Condom  Other Topics Concern   Not on file  Social History Narrative   Regular exercise-no    Caffeine  Use-yes   Social Drivers of Health   Financial Resource Strain: Low Risk  (04/09/2024)   Overall Financial Resource Strain (CARDIA)    Difficulty of Paying Living Expenses: Not hard at all  Food Insecurity: No Food Insecurity (04/09/2024)   Hunger Vital Sign    Worried About Running Out of Food in the Last Year: Never true    Ran Out of Food in the Last Year: Never true  Transportation Needs: No Transportation Needs (04/09/2024)   PRAPARE - Administrator, Civil Service (Medical): No    Lack of Transportation (Non-Medical): No  Physical Activity: Insufficiently Active (04/09/2024)   Exercise Vital Sign    Days of Exercise per Week: 3 days    Minutes of Exercise per Session: 20 min  Stress: No Stress Concern Present (04/09/2024)   [employer] of Occupational Health - Occupational Stress Questionnaire    Feeling of Stress : Not at all  Social Connections: Moderately Isolated (04/09/2024)   Social Connection and Isolation Panel [NHANES]    Frequency of Communication with Friends and Family: More than three times a week    Frequency of Social Gatherings with Friends and Family: Three times a week    Attends Religious Services: Never    Active Member of Clubs or Organizations: No    Attends Banker Meetings: Not on file    Marital Status: Married  Intimate Partner Violence: Unknown (03/17/2022)   Received from [employer]   HITS    Physically Hurt: Not on file    Insult or Talk Down To: Not on file    Threaten Physical Harm: Not on file    Scream or Curse: Not on file     Constitutional: Patient reports intermittent headaches.  Denies fever, malaise, fatigue, or abrupt weight changes.  Respiratory: Denies difficulty breathing, shortness of breath, cough or sputum production.   Cardiovascular: Denies chest pain, chest tightness, palpitations or swelling in the hands or feet.  Gastrointestinal: Denies abdominal pain, bloating,  constipation, diarrhea or blood in the stool.  GU: Patient reports erectile dysfunction.  Denies urgency, frequency, pain with urination, burning sensation, blood in urine, odor or discharge. Musculoskeletal: Patient reports back pain,, right leg pain.  Denies decrease in range of motion, difficulty with gait, muscle pain or joint swelling.  Skin: Denies redness, rashes, lesions or ulcercations.  Neurological: Denies loss, tingling, weakness or problems with balance and coordination.    No other specific complaints in a complete review of systems (except as listed in HPI above).  Objective:   Physical Exam  BP 116/80 (BP Location: Left Arm, Patient Position: Sitting, Cuff Size: Large)   Ht 5\' 11"  (1.803 m)   Wt 264 lb 3.2 oz (119.8 kg)   BMI 36.85 kg/m     Wt Readings from Last 3 Encounters:  04/11/24 258 lb (117 kg)  03/26/24 261 lb 9.6 oz (118.7 kg)  07/20/23 256 lb (116.1 kg)    General: Appears his stated age, obese in NAD. Skin: Warm, dry and intact.  Cardiovascular: Normal rate and rhythm. S1,S2 noted.  No murmur, rubs or gallops noted. No JVD.  Pulmonary/Chest: Normal effort and positive vesicular breath sounds. No respiratory distress. No wheezes, rales or ronchi noted.  Musculoskeletal: He does have some difficulty getting from sitting to standing position.  Normal flexion, extension, rotation lateral bending of the spine.  Bony tenderness noted over the lumbar spine and right SI joint.  Strength 5/5 BLE.  No difficulty with gait.  Neurological: Alert and oriented.  Positive SLR on the right at 30 degrees.  Sensation intact and equal to BLE.   BMET    Component Value Date/Time   NA 138 04/11/2024 1547   K 4.3 04/11/2024 1547   CL 102 04/11/2024 1547   CO2 29 04/11/2024 1547   GLUCOSE 94 04/11/2024 1547   BUN 25 04/11/2024 1547   CREATININE 1.05 04/11/2024 1547   CALCIUM  9.2 04/11/2024 1547   GFRNONAA >60 06/11/2018 1018   GFRAA >60 06/11/2018 1018    Lipid  Panel     Component Value Date/Time   CHOL 137 04/11/2024 1547   TRIG 52 04/11/2024 1547   HDL 61 04/11/2024 1547   CHOLHDL 2.2 04/11/2024 1547   VLDL 19.8 03/26/2020 0849   LDLCALC 63 04/11/2024 1547    CBC    Component Value Date/Time   WBC 9.3 04/11/2024 1547   RBC 4.63 04/11/2024 1547   HGB 13.2 04/11/2024 1547   HGB 14.8 12/07/2017 0847   HCT 39.2 04/11/2024 1547   HCT 43.4 12/07/2017 0847   PLT 262 04/11/2024 1547   MCV 84.7 04/11/2024 1547   MCH 28.5 04/11/2024 1547   MCHC 33.7 04/11/2024 1547   RDW 13.2 04/11/2024 1547   LYMPHSABS 2.2 12/30/2020 1600   MONOABS 0.5 12/30/2020 1600   EOSABS 0.3 12/30/2020 1600   BASOSABS 0.1 12/30/2020 1600    Hgb A1C Lab Results  Component Value Date   HGBA1C 5.8 (H) 04/11/2024           Assessment & Plan:   Assessment and Plan    Sciatica Acute right-sided sciatic nerve irritation with severe pain unresponsive to ibuprofen and hydrocodone. Differential includes nerve compression or irritation. - Administered Toradol  30 mg IM once. - Prescribed 9-day prednisone  taper: 30 mg for 3 days, 20 mg for 3 days, 10 mg for 3 days. - Prescribed Flexeril 10 mg up to three times daily as needed-sedation caution given. - Provided stretching exercises via MyChart. - Advised alternating ice and heat therapy. - Consider physical therapy, repeat x-ray, or MRI if no improvement.      RTC in 5 months, follow-up chronic conditions Helayne Lo, NP

## 2024-05-13 NOTE — Patient Instructions (Signed)

## 2024-05-22 IMAGING — CT CT ABD-PELV W/ CM
2 of 5 series · 16 of 46 positions shown, 18 images · IV contrast (agent unspecified)
Comparison: CT abdomen and pelvis 06/11/2018

CLINICAL DATA: Abdominal pain

EXAM:
CT ABDOMEN AND PELVIS WITH CONTRAST
TECHNIQUE: Multidetector CT imaging of the abdomen and pelvis was performed
using the standard protocol following bolus administration of
intravenous contrast.

[Series 2: abd pelvis 5.00 · axial · 0.88mm/px · z∈[-1530,-1080]mm · 13 of 102 slices shown, 15 images]
[im 6/102  soft-tissue]
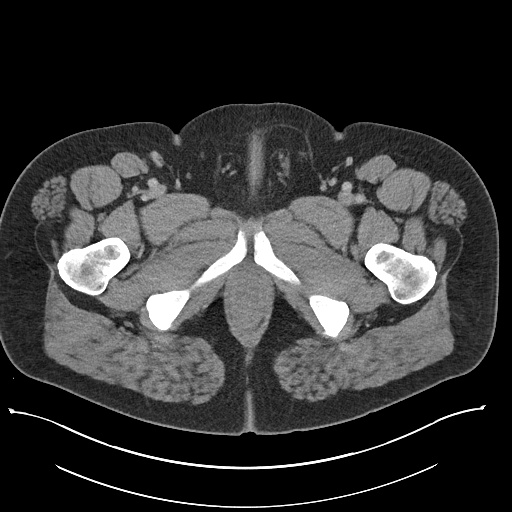
[im 6/102  bone]
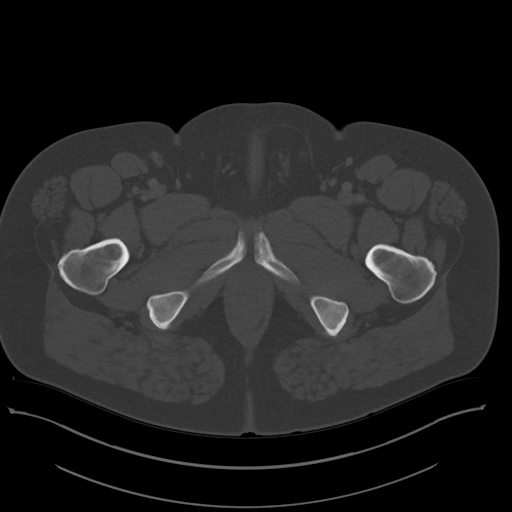
[im 12/102  soft-tissue]
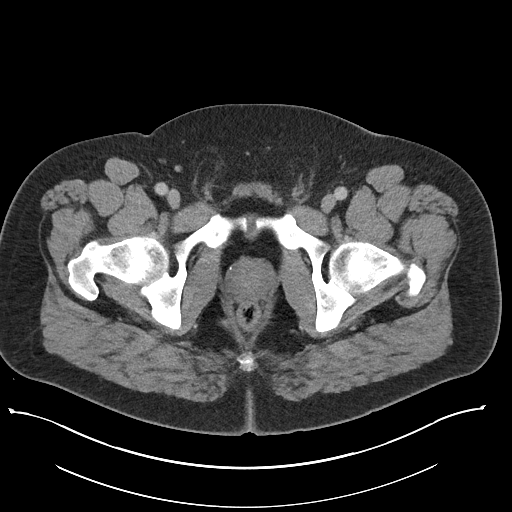
[im 23/102  soft-tissue]
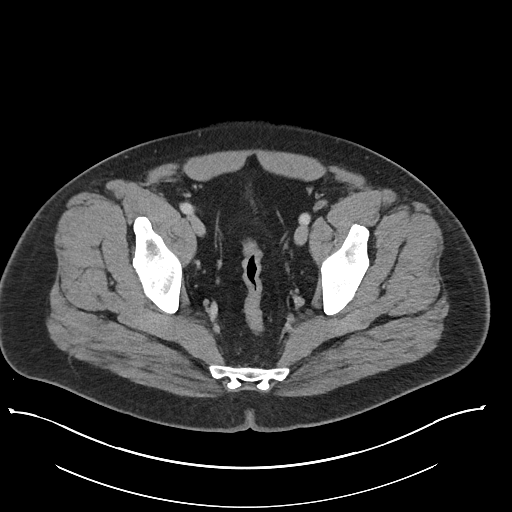
[im 29/102  soft-tissue]
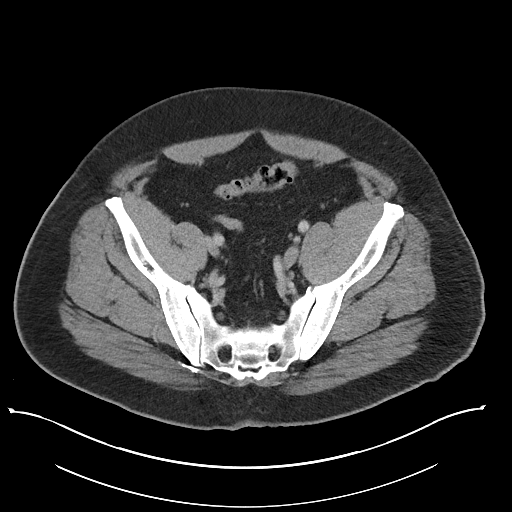
[im 34/102  soft-tissue]
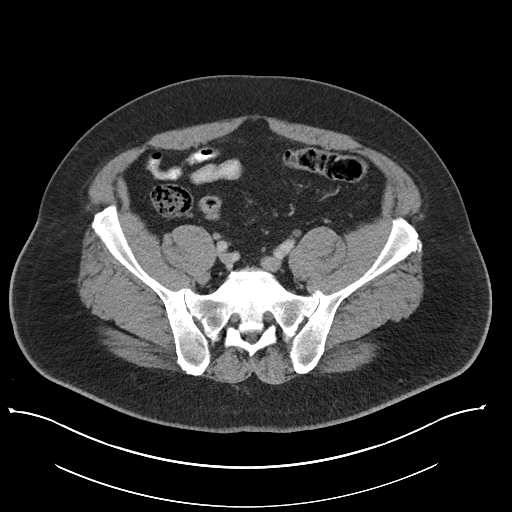
[im 45/102  soft-tissue]
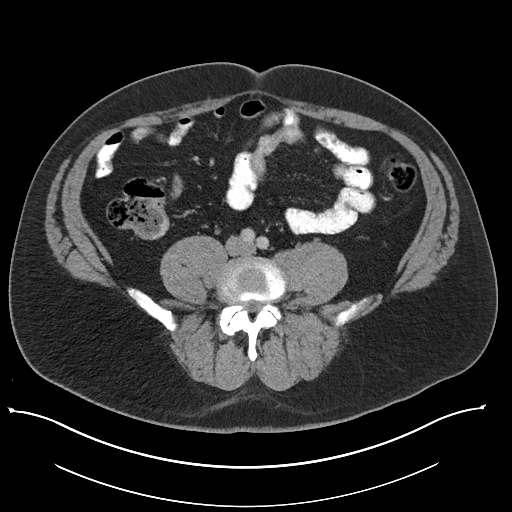
[im 51/102  soft-tissue]
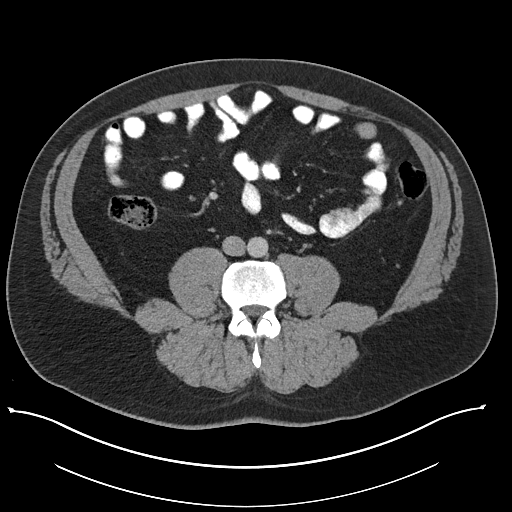
[im 57/102  soft-tissue]
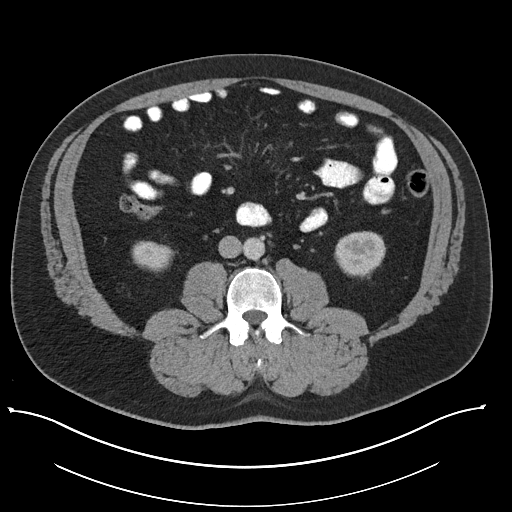
[im 68/102  soft-tissue]
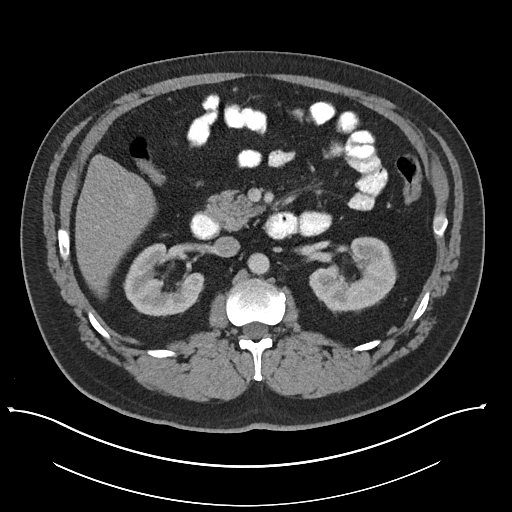
[im 68/102  bone]
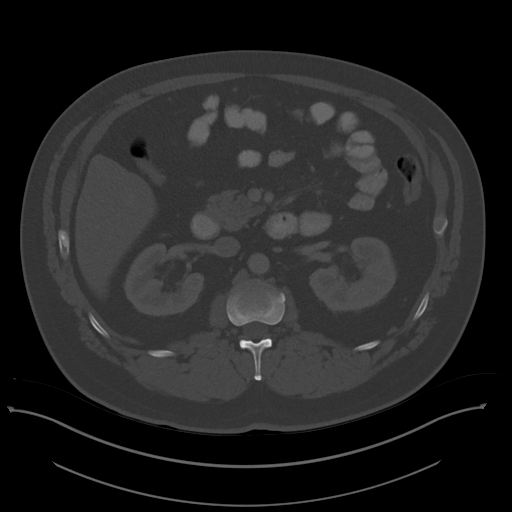
[im 73/102  soft-tissue]
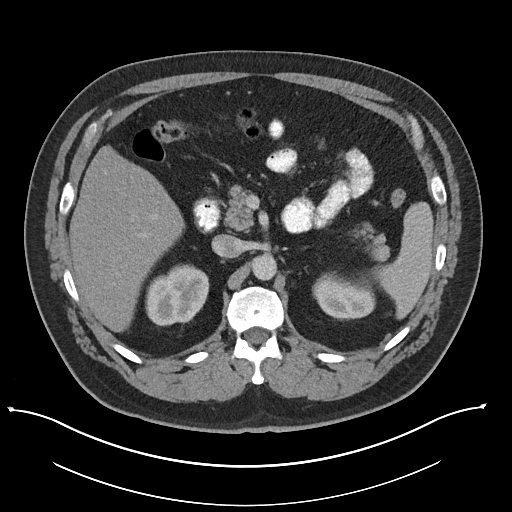
[im 79/102  soft-tissue]
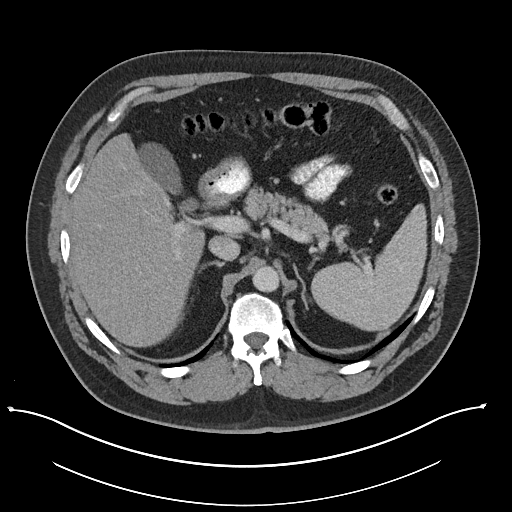
[im 90/102  soft-tissue]
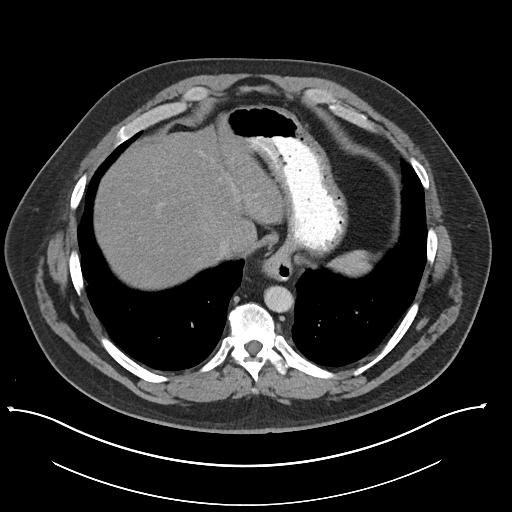
[im 96/102  soft-tissue]
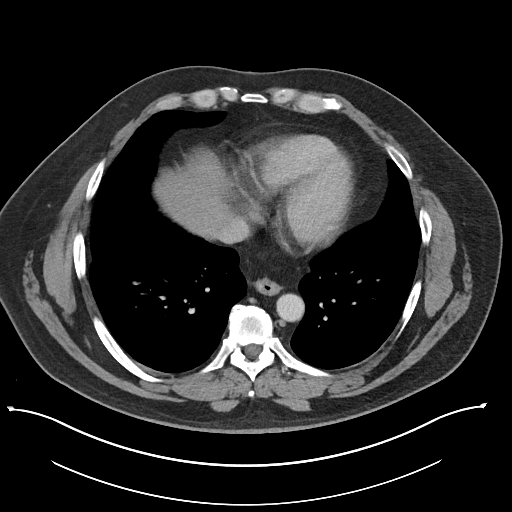

[Series 4: coronals abd pelvis 2.00 cor · coronal · 0.88mm/px · 3 of 173 slices shown]
[im 58/173  soft-tissue]
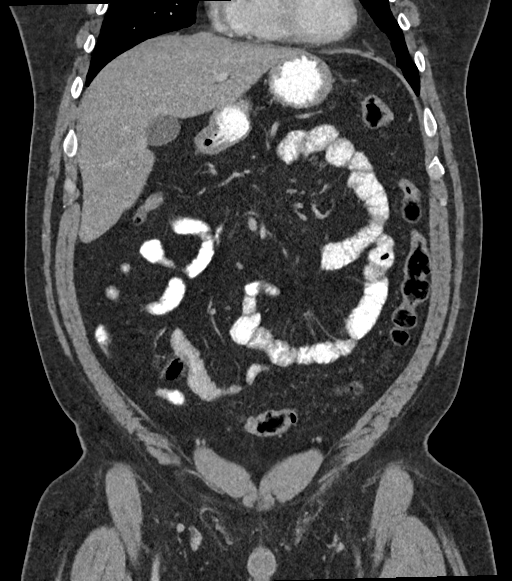
[im 77/173  soft-tissue]
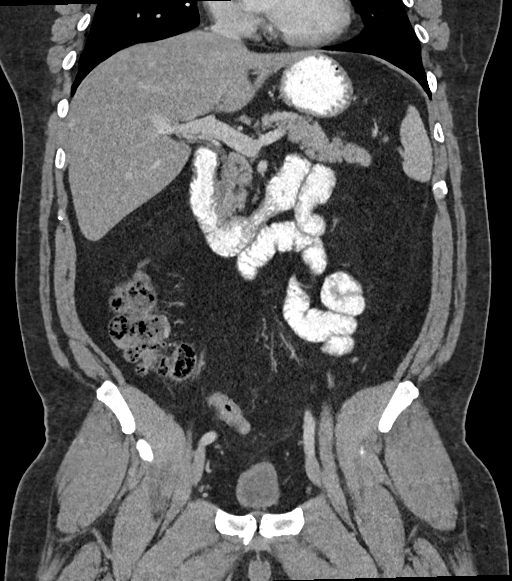
[im 96/173  soft-tissue]
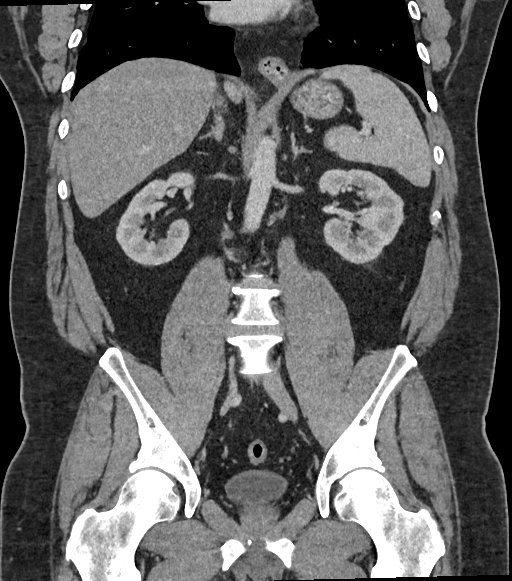

[16 of 46 positions shown; findings below may reference images not displayed]

RADIATION DOSE REDUCTION: This exam was performed according to the
departmental dose-optimization program which includes automated
exposure control, adjustment of the mA and/or kV according to
patient size and/or use of iterative reconstruction technique.

CONTRAST:  100mL OMNIPAQUE IOHEXOL 300 MG/ML  SOLN
FINDINGS: Lower chest: No acute abnormality.

Hepatobiliary: Liver is normal in size and contour with no
suspicious mass identified. Hypodense hepatic parenchyma suggesting
steatosis. Gallbladder appears normal. No biliary ductal dilatation
identified.

Pancreas: Unremarkable. No pancreatic ductal dilatation or
surrounding inflammatory changes.

Spleen: Normal in size without focal abnormality.

Adrenals/Urinary Tract: Adrenal glands appear normal. 1 cm exophytic
increased density lesion in the mid left kidney, similar to previous
studies. A few tiny suspected renal calculi on the left measuring up
to 2 mm. No hydronephrosis identified. Urinary bladder appears
within normal limits.

Stomach/Bowel: Small hiatal hernia. No bowel obstruction, free air
or pneumatosis. No bowel wall edema. No evidence of acute
appendicitis.

Vascular/Lymphatic: No significant vascular findings are present. No
enlarged abdominal or pelvic lymph nodes.

Reproductive: Prostate is unremarkable.

Other: No ascites.  Tiny umbilical hernia containing fat.

Musculoskeletal: No suspicious bony lesions identified.
IMPRESSION: 1. No acute process identified.
2. Suggestion of hepatic steatosis.
3. 1 cm exophytic increased density lesion in the mid left kidney,
was consistent with hemorrhagic/proteinaceous cyst on previous MRI.
4. Small hiatal hernia.

## 2024-05-23 ENCOUNTER — Encounter: Payer: Self-pay | Admitting: Internal Medicine

## 2024-05-27 ENCOUNTER — Other Ambulatory Visit: Payer: Self-pay | Admitting: Cardiology

## 2024-05-27 DIAGNOSIS — I10 Essential (primary) hypertension: Secondary | ICD-10-CM

## 2024-05-27 DIAGNOSIS — R0609 Other forms of dyspnea: Secondary | ICD-10-CM

## 2024-05-27 DIAGNOSIS — Z01810 Encounter for preprocedural cardiovascular examination: Secondary | ICD-10-CM

## 2024-06-21 ENCOUNTER — Other Ambulatory Visit: Payer: Self-pay | Admitting: Cardiology

## 2024-06-21 DIAGNOSIS — Z01810 Encounter for preprocedural cardiovascular examination: Secondary | ICD-10-CM

## 2024-06-21 DIAGNOSIS — R0609 Other forms of dyspnea: Secondary | ICD-10-CM

## 2024-06-21 DIAGNOSIS — I10 Essential (primary) hypertension: Secondary | ICD-10-CM

## 2024-06-24 NOTE — Telephone Encounter (Signed)
 15 days supply is okay.  No refill.  Have him come in if we are going to be doing his medications otherwise route to PCP going forward.   Dr.Mariadelosang Wynns

## 2024-06-24 NOTE — Telephone Encounter (Signed)
 Pt has had 3 attempts to make overdue appt with Dr. Michele and has not done so. Pt was last seen in 2022. Would Dr. Michele like to refill medication without pt being seen? Please address

## 2024-06-30 ENCOUNTER — Other Ambulatory Visit (HOSPITAL_COMMUNITY): Payer: Self-pay

## 2024-06-30 ENCOUNTER — Other Ambulatory Visit: Payer: Self-pay | Admitting: Internal Medicine

## 2024-06-30 DIAGNOSIS — Z0000 Encounter for general adult medical examination without abnormal findings: Secondary | ICD-10-CM

## 2024-07-01 NOTE — Telephone Encounter (Signed)
 Requested Prescriptions  Pending Prescriptions Disp Refills   lisinopril -hydrochlorothiazide  (ZESTORETIC ) 10-12.5 MG tablet [Pharmacy Med Name: LISINOPRIL -HCTZ 10-12.5 MG TAB] 90 tablet 1    Sig: TAKE 1 TABLET BY MOUTH EVERY DAY     Cardiovascular:  ACEI + Diuretic Combos Passed - 07/01/2024  4:09 PM      Passed - Na in normal range and within 180 days    Sodium  Date Value Ref Range Status  04/11/2024 138 135 - 146 mmol/L Final         Passed - K in normal range and within 180 days    Potassium  Date Value Ref Range Status  04/11/2024 4.3 3.5 - 5.3 mmol/L Final         Passed - Cr in normal range and within 180 days    Creat  Date Value Ref Range Status  04/11/2024 1.05 0.70 - 1.30 mg/dL Final   Creatinine, Urine  Date Value Ref Range Status  07/26/2023 39 20 - 320 mg/dL Final         Passed - eGFR is 30 or above and within 180 days    GFR calc Af Amer  Date Value Ref Range Status  06/11/2018 >60 >60 mL/min Final    Comment:    (NOTE) The eGFR has been calculated using the CKD EPI equation. This calculation has not been validated in all clinical situations. eGFR's persistently <60 mL/min signify possible Chronic Kidney Disease.    GFR calc non Af Amer  Date Value Ref Range Status  06/11/2018 >60 >60 mL/min Final   GFR  Date Value Ref Range Status  12/30/2020 74.24 >60.00 mL/min Final    Comment:    Calculated using the CKD-EPI Creatinine Equation (2021)   eGFR  Date Value Ref Range Status  04/11/2024 85 > OR = 60 mL/min/1.32m2 Final         Passed - Patient is not pregnant      Passed - Last BP in normal range    BP Readings from Last 1 Encounters:  05/13/24 116/80         Passed - Valid encounter within last 6 months    Recent Outpatient Visits           1 month ago Acute midline low back pain with right-sided sciatica   [facility] [place], Angeline ORN, NP   2 months ago Encounter for general adult medical examination with  abnormal findings   [facility] [place], Angeline ORN, NP       Future Appointments             In 9 months Francisca, Redell BROCKS, MD [facility] [place]

## 2024-07-08 LAB — HM DIABETES EYE EXAM

## 2024-07-21 ENCOUNTER — Other Ambulatory Visit: Payer: Self-pay | Admitting: Cardiology

## 2024-07-21 DIAGNOSIS — R0609 Other forms of dyspnea: Secondary | ICD-10-CM

## 2024-07-21 DIAGNOSIS — Z01810 Encounter for preprocedural cardiovascular examination: Secondary | ICD-10-CM

## 2024-07-21 DIAGNOSIS — I10 Essential (primary) hypertension: Secondary | ICD-10-CM

## 2024-08-12 ENCOUNTER — Other Ambulatory Visit: Payer: Self-pay | Admitting: Cardiology

## 2024-08-12 DIAGNOSIS — I10 Essential (primary) hypertension: Secondary | ICD-10-CM

## 2024-08-12 DIAGNOSIS — R0609 Other forms of dyspnea: Secondary | ICD-10-CM

## 2024-08-12 DIAGNOSIS — Z01810 Encounter for preprocedural cardiovascular examination: Secondary | ICD-10-CM

## 2024-09-03 ENCOUNTER — Telehealth: Payer: Self-pay | Admitting: Cardiology

## 2024-09-03 DIAGNOSIS — R0609 Other forms of dyspnea: Secondary | ICD-10-CM

## 2024-09-03 DIAGNOSIS — I10 Essential (primary) hypertension: Secondary | ICD-10-CM

## 2024-09-03 DIAGNOSIS — Z01810 Encounter for preprocedural cardiovascular examination: Secondary | ICD-10-CM

## 2024-09-03 MED ORDER — METOPROLOL SUCCINATE ER 25 MG PO TB24: 25.0000 mg | ORAL_TABLET | Freq: Every day | ORAL | 0 refills | Status: DC

## 2024-09-03 NOTE — Telephone Encounter (Signed)
*  STAT* If patient is at the pharmacy, call can be transferred to refill team.   1. Which medications need to be refilled? (please list name of each medication and dose if known) metoprolol succinate (TOPROL XL) 25 MG 24 hr tablet   2. Which pharmacy/location (including street and city if local pharmacy) is medication to be sent to?  [id number] [address], [place] [phone]    3. Do they need a 30 day or 90 day supply? 90

## 2024-09-03 NOTE — Telephone Encounter (Signed)
 RX sent in

## 2024-09-17 ENCOUNTER — Encounter: Payer: Self-pay | Admitting: Cardiology

## 2024-09-17 ENCOUNTER — Ambulatory Visit: Attending: Cardiology | Admitting: Cardiology

## 2024-09-17 VITALS — BP 119/75 | HR 73 | Resp 16 | Ht 71.0 in | Wt 266.0 lb

## 2024-09-17 DIAGNOSIS — Z8249 Family history of ischemic heart disease and other diseases of the circulatory system: Secondary | ICD-10-CM | POA: Diagnosis not present

## 2024-09-17 DIAGNOSIS — R072 Precordial pain: Secondary | ICD-10-CM

## 2024-09-17 DIAGNOSIS — R931 Abnormal findings on diagnostic imaging of heart and coronary circulation: Secondary | ICD-10-CM | POA: Diagnosis not present

## 2024-09-17 DIAGNOSIS — I10 Essential (primary) hypertension: Secondary | ICD-10-CM | POA: Diagnosis not present

## 2024-09-17 DIAGNOSIS — E66812 Obesity, class 2: Secondary | ICD-10-CM

## 2024-09-17 DIAGNOSIS — E6609 Other obesity due to excess calories: Secondary | ICD-10-CM

## 2024-09-17 DIAGNOSIS — Z6837 Body mass index (BMI) 37.0-37.9, adult: Secondary | ICD-10-CM

## 2024-09-17 DIAGNOSIS — E782 Mixed hyperlipidemia: Secondary | ICD-10-CM

## 2024-09-17 MED ORDER — METOPROLOL TARTRATE 50 MG PO TABS: 50.0000 mg | ORAL_TABLET | Freq: Once | ORAL | 0 refills | Status: DC

## 2024-09-17 NOTE — Progress Notes (Signed)
 Cardiology Office Note:  .   Date:  09/17/2024  ID:  Kenneth Sherman, DOB 1971/10/26, MRN [id number] PCP:  Antonette Angeline ORN, NP  Former Cardiology Providers: None  HeartCare Providers Cardiologist:  Madonna Large, DO , [facility] (established care 09/09/2020) Electrophysiologist:  None  Click to update primary MD,subspecialty MD or APP then REFRESH:1}    Chief Complaint  Patient presents with   Agatston CAC score, <100   Follow-up    1 year    History of Present Illness: .   Kenneth Sherman is a 53 y.o. Caucasian male whose past medical history and cardiovascular risk factors includes: Prediabetes, Family history of premature coronary artery disease, coronary artery calcification, hypertension, hyperlipidemia, obesity.   In 2021 patient was referred to the practice for preoperative risk assessment prior to upcoming noncardiac surgery given his strong family history of premature CAD.  He underwent appropriate cardiovascular testing as outlined below.  He was last seen in the office in November 2022 and presents today for follow-up.  Precordial pain: Patient utilizes his stationary bike 20 minutes at least 2 times per week and does well.  However, when he plays soccer with his son on the field he notices anterior chest wall discomfort.  He describes it as a twitch like sensation.  Noticeable with effort related activities.  Last for 5 to 6 minutes.  Worse with overexertion.  Improves with slowing down.  No near-syncope or syncopal events.    Patient has implemented lifestyle changes since last office visit and has lost 31 pounds. Patient was recently diagnosed with early diabetes and with lifestyle modifications and her hemoglobin A1c has improved and now in the prediabetic range at 5.8%.  His LDL is 63 mg/dL.  Family history of premature coronary disease (mom passed at the age of 31 and dad passed away at the age of 59 due to heart disease).   Review of Systems: .   Review of Systems   Cardiovascular:  Positive for chest pain (See HPI). Negative for claudication, irregular heartbeat, leg swelling, near-syncope, orthopnea, palpitations, paroxysmal nocturnal dyspnea and syncope.  Respiratory:  Negative for shortness of breath.   Hematologic/Lymphatic: Negative for bleeding problem.    Studies Reviewed:   EKG: EKG Interpretation Date/Time:  Wednesday September 17 2024 15:55:01 EDT Ventricular Rate:  88 PR Interval:  172 QRS Duration:  108 QT Interval:  368 QTC Calculation: 445 R Axis:   -32  Text Interpretation: Sinus rhythm with marked sinus arrhythmia Left axis deviation When compared with ECG of 02-Dec-2012 20:58, Since last tracing rate slower Confirmed by Large Madonna [phone]) on 09/17/2024 4:08:14 PM  Echocardiogram: 09/16/2020: Left ventricle cavity is normal in size and wall thickness. Normal global wall motion. Normal LV systolic function with visual EF 50-55%. Normal diastolic filling pattern. Left atrial cavity is mildly dilated. Mild tricuspid regurgitation. No evidence of pulmonary hypertension.   Stress Testing: Lexiscan (Walking with cora Pin) Sestamibi Stress Test 09/15/2020: Low risk.    Coronary artery calcification scoring performed on 09/24/2020 at [facility]: Left main: 0 Left anterior descending: 11 Left circumflex: 1 Right coronary artery: 8 Total calcium  score: 20 AU 50-75th percentile.  Noncardiac findings: Small sliding hiatal hernia.  RADIOLOGY: NA  Risk Assessment/Calculations:   NA   Labs:       Latest Ref Rng & Units 04/11/2024    3:47 PM 04/06/2023    1:44 PM 08/31/2022    3:16 PM  CBC  WBC 3.8 - 10.8 Thousand/uL 9.3  9.2  8.6   Hemoglobin 13.2 - 17.1 g/dL 86.7  86.2  85.9   Hematocrit 38.5 - 50.0 % 39.2  40.3  40.6   Platelets 140 - 400 Thousand/uL 262  301  292        Latest Ref Rng & Units 04/11/2024    3:47 PM 07/26/2023    8:37 AM 04/06/2023    1:44 PM  BMP  Glucose 65 - 99 mg/dL 94  97  882   BUN 7 - 25  mg/dL 25  17  13    Creatinine 0.70 - 1.30 mg/dL 8.94  9.19  8.93   BUN/Creat Ratio 6 - 22 (calc) SEE NOTE:  SEE NOTE:  SEE NOTE:   Sodium 135 - 146 mmol/L 138  139  140   Potassium 3.5 - 5.3 mmol/L 4.3  4.6  4.3   Chloride 98 - 110 mmol/L 102  98  100   CO2 20 - 32 mmol/L 29  30  30    Calcium  8.6 - 10.3 mg/dL 9.2  9.6  9.4       Latest Ref Rng & Units 04/11/2024    3:47 PM 07/26/2023    8:37 AM 04/06/2023    1:44 PM  CMP  Glucose 65 - 99 mg/dL 94  97  882   BUN 7 - 25 mg/dL 25  17  13    Creatinine 0.70 - 1.30 mg/dL 8.94  9.19  8.93   Sodium 135 - 146 mmol/L 138  139  140   Potassium 3.5 - 5.3 mmol/L 4.3  4.6  4.3   Chloride 98 - 110 mmol/L 102  98  100   CO2 20 - 32 mmol/L 29  30  30    Calcium  8.6 - 10.3 mg/dL 9.2  9.6  9.4   Total Protein 6.1 - 8.1 g/dL 7.0  7.1  7.3   Total Bilirubin 0.2 - 1.2 mg/dL 0.3  0.5  0.3   AST 10 - 35 U/L 20  23  15    ALT 9 - 46 U/L 23  31  23      Lab Results  Component Value Date   CHOL 137 04/11/2024   HDL 61 04/11/2024   LDLCALC 63 04/11/2024   LDLDIRECT 157.0 06/04/2018   TRIG 52 04/11/2024   CHOLHDL 2.2 04/11/2024   No results for input(s): LIPOA in the last 8760 hours. No components found for: NTPROBNP No results for input(s): PROBNP in the last 8760 hours. No results for input(s): TSH in the last 8760 hours.  Physical Exam:    Today's Vitals   09/17/24 1552  BP: 119/75  Pulse: 73  Resp: 16  SpO2: 94%  Weight: 266 lb (120.7 kg)  Height: 5' 11 (1.803 m)   Body mass index is 37.1 kg/m. Wt Readings from Last 3 Encounters:  09/17/24 266 lb (120.7 kg)  05/13/24 264 lb 3.2 oz (119.8 kg)  04/11/24 258 lb (117 kg)    Physical Exam  Constitutional: No distress.  hemodynamically stable  Neck: No JVD present.  Cardiovascular: Normal rate, regular rhythm, S1 normal and S2 normal. Exam reveals no gallop, no S3 and no S4.  No murmur heard. Pulmonary/Chest: Effort normal and breath sounds normal. No stridor. He has no wheezes.  He has no rales.  Musculoskeletal:        General: No edema.     Cervical back: Neck supple.  Skin: Skin is warm.     Impression & Recommendation(s):  Impression:   ICD-10-CM  1. Precordial pain  R07.2 Basic Metabolic Panel (BMET)    ECHOCARDIOGRAM COMPLETE    CT CORONARY MORPH W/CTA COR W/SCORE W/CA W/CM &/OR WO/CM    2. Agatston coronary artery calcium  score less than 100  R93.1     3. Family history of premature CAD  Z82.49     4. Benign hypertension  I10 EKG 12-Lead    5. Mixed hyperlipidemia  E78.2     6. Class 2 obesity due to excess calories without serious comorbidity with body mass index (BMI) of 37.0 to 37.9 in adult  E66.812    E66.09    Z68.37        Recommendation(s):  Precordial pain Agatston coronary artery calcium  score less than 100 Family history of premature CAD Patient endorses precordial discomfort with over exertional activities. EKG is nonischemic. Risk factors: Prediabetes, hyperlipidemia, strong family history of premature CAD Echo will be ordered to evaluate for structural heart disease and left ventricular systolic function. Coronary CTA to evaluate for CAC, plaque burden, and obstructive disease BMP prior to coronary CTA. On the day of his coronary CTA patient is advised to not take his Toprol -XL 25 mg p.o. daily and to take Lopressor  50 mg p.o. x 1 two hours prior to scan.  Benign hypertension Office blood pressures are very well-controlled. Continue lisinopril /hydrochlorothiazide  10/12.5 mg p.o. daily. Continue Toprol -XL 25 mg p.o. daily  Mixed hyperlipidemia Continue Zetia  10 mg p.o. daily. Continues Lipitor 10 mg p.o. daily. Most recent LDL 63 mg/dL, May 2025  Class 2 obesity due to excess calories without serious comorbidity with body mass index (BMI) of 37.0 to 37.9 in adult With lifestyle modifications patient has lost approximately 31 pounds since last office visit, congratulated on his efforts Advised him to not overexert  until workup is complete. Body mass index is 37.1 kg/m. I reviewed with him importance of diet, regular physical activity/exercise, weight loss.   Patient is educated on the importance of increasing physical activity gradually as tolerated with a goal of moderate intensity exercise for 30 minutes a day 5 days a week.  Orders Placed:  Orders Placed This Encounter  Procedures   CT CORONARY MORPH W/CTA COR W/SCORE W/CA W/CM &/OR WO/CM    Standing Status:   Future    Expiration Date:   09/17/2025    If indicated for the ordered procedure, I authorize the administration of contrast media per Radiology protocol:   Yes    Initiate Coronary CTA Adult Protocol:   Yes    If indicated initiate Post Coronary CTA Hypotension Adult Protocol:   Yes    Does the patient have a contrast media/X-ray dye allergy?:   No    [facility]    Release to patient:   Immediate [1]    Authorization::   FFR + Plaque Analysis/Characterization will be ordered if deemed medically necessary   Basic Metabolic Panel (BMET)   EKG 12-Lead   ECHOCARDIOGRAM COMPLETE    Standing Status:   Future    Expected Date:   09/17/2025    Where should this test be performed[facility]    Does the patient weigh less than or greater than 250 lbs?:   Patient weighs greater than 250 lbs    Perflutren DEFINITY (image enhancing agent) should be administered unless hypersensitivity or allergy exist:   Administer Perflutren    Reason for exam-Echo:   Chest Pain  R07.9  Release to patient:   Immediate     Final Medication List:    Meds ordered this encounter  Medications   metoprolol  tartrate (LOPRESSOR ) 50 MG tablet    Sig: Take 1 tablet (50 mg total) by mouth once for 1 dose. Take 50 mg (1 tablet) two hours prior to CT scan. Hold your daily dose of metoprolol  for this day only.    Dispense:  1 tablet    Refill:  0    Medications Discontinued During This Encounter  Medication  Reason   cyclobenzaprine  (FLEXERIL ) 10 MG tablet Patient Preference   predniSONE  (DELTASONE ) 10 MG tablet Patient Preference     Current Outpatient Medications:    atorvastatin  (LIPITOR) 10 MG tablet, TAKE 1 TABLET BY MOUTH EVERY DAY, Disp: 90 tablet, Rfl: 1   celecoxib  (CELEBREX ) 200 MG capsule, Take 1 capsule (200 mg total) by mouth daily., Disp: 90 capsule, Rfl: 1   ezetimibe  (ZETIA ) 10 MG tablet, TAKE 1 TABLET BY MOUTH EVERY DAY, Disp: 90 tablet, Rfl: 1   lisinopril -hydrochlorothiazide  (ZESTORETIC ) 10-12.5 MG tablet, TAKE 1 TABLET BY MOUTH EVERY DAY, Disp: 90 tablet, Rfl: 1   metoprolol  succinate (TOPROL -XL) 25 MG 24 hr tablet, Take 1 tablet (25 mg total) by mouth daily., Disp: 15 tablet, Rfl: 0   metoprolol  tartrate (LOPRESSOR ) 50 MG tablet, Take 1 tablet (50 mg total) by mouth once for 1 dose. Take 50 mg (1 tablet) two hours prior to CT scan. Hold your daily dose of metoprolol  for this day only., Disp: 1 tablet, Rfl: 0   nortriptyline  (PAMELOR ) 75 MG capsule, Take 1 capsule (75 mg total) by mouth at bedtime., Disp: 90 capsule, Rfl: 1   omeprazole  (PRILOSEC) 40 MG capsule, Take 1 capsule (40 mg total) by mouth daily before breakfast., Disp: 180 capsule, Rfl: 1   SUMAtriptan  (IMITREX ) 25 MG tablet, TAKE 1 TABLET BY MOUTH AS NEEDED FOR MIGRAINE. MAY REPEAT IN 2 HOURS IF HEADACHE PERSISTS OR RECURS., Disp: 10 tablet, Rfl: 0   tadalafil  (CIALIS ) 5 MG tablet, Take 1 tablet (5 mg total) by mouth daily. Take 5 mg daily, ok to take additional 10-15mg  boost dose 1 hour prior to sexual activity as needed, Disp: 90 tablet, Rfl: 6  Consent:   NA  Disposition:   1 year follow-up sooner if needed  His questions and concerns were addressed to his satisfaction. He voices understanding of the recommendations provided during this encounter.    Signed, Madonna Michele HAS, [facility]  A Division of [facility] [address] [place], [place] [address]  09/17/2024 4:52  PM

## 2024-09-17 NOTE — Patient Instructions (Addendum)
 Medication Instructions:  Your physician recommends that you continue on your current medications as directed. Please refer to the Current Medication list given to you today.  *If you need a refill on your cardiac medications before your next appointment, please call your pharmacy*  Lab Work: BMP today If you have labs (blood work) drawn today and your tests are completely normal, you will receive your results only by: MyChart Message (if you have MyChart) OR A paper copy in the mail If you have any lab test that is abnormal or we need to change your treatment, we will call you to review the results.  Testing/Procedures: Your physician has requested that you have an echocardiogram. Echocardiography is a painless test that uses sound waves to create images of your heart. It provides your doctor with information about the size and shape of your heart and how well your heart's chambers and valves are working. This procedure takes approximately one hour. There are no restrictions for this procedure. Please do NOT wear cologne, perfume, aftershave, or lotions (deodorant is allowed). Please arrive 15 minutes prior to your appointment time.  Please note: We ask at that you not bring children with you during ultrasound (echo/ vascular) testing. Due to room size and safety concerns, children are not allowed in the ultrasound rooms during exams. Our front office staff cannot provide observation of children in our lobby area while testing is being conducted. An adult accompanying a patient to their appointment will only be allowed in the ultrasound room at the discretion of the ultrasound technician under special circumstances. We apologize for any inconvenience.     Your cardiac CT will be scheduled at one of the below locations:   [facility] [address] [place], [place] [address] [phone]   Please follow these instructions carefully (unless otherwise  directed):  An IV will be required for this test and Nitroglycerin will be given.  Hold all erectile dysfunction medications at least 3 days (72 hrs) prior to test. (Ie viagra , cialis , sildenafil , tadalafil , etc)   On the Night Before the Test: Be sure to Drink plenty of water. Do not consume any caffeinated/decaffeinated beverages or chocolate 12 hours prior to your test. Do not take any antihistamines 12 hours prior to your test. If the patient has contrast allergy: Patient will need a prescription for Prednisone  and very clear instructions (as follows): Prednisone  50 mg - take 13 hours prior to test Take another Prednisone  50 mg 7 hours prior to test Take another Prednisone  50 mg 1 hour prior to test Take Benadryl  50 mg 1 hour prior to test Patient must complete all four doses of above prophylactic medications. Patient will need a ride after test due to Benadryl .  On the Day of the Test: Drink plenty of water until 1 hour prior to the test. Do not eat any food 1 hour prior to test. You may take your regular medications prior to the test.  Take metoprolol  (Lopressor ) two hours prior to test. If you take Furosemide/Hydrochlorothiazide /Spironolactone, please HOLD on the morning of the test. FEMALES- please wear underwire-free bra if available, avoid dresses & tight clothing       After the Test: Drink plenty of water. After receiving IV contrast, you may experience a mild flushed feeling. This is normal. On occasion, you may experience a mild rash up to 24 hours after the test. This is not dangerous. If this occurs, you can take Benadryl  25 mg and increase your  fluid intake. If you experience trouble breathing, this can be serious. If it is severe call 911 IMMEDIATELY. If it is mild, please call our office. If you take any of these medications: Glipizide/Metformin, Avandament, Glucavance, please do not take 48 hours after completing test unless otherwise instructed.  We will call to  schedule your test 2-4 weeks out understanding that some insurance companies will need an authorization prior to the service being performed.   For more information and frequently asked questions, please visit our website : [email or web address]  For non-scheduling related questions, please contact the cardiac imaging nurse navigator should you have any questions/concerns: Cardiac Imaging Nurse Navigators Direct Office Dial: [phone]   For scheduling needs, including cancellations and rescheduling, please call [place], [phone].       Follow-Up: At [facility] you and your health needs are our priority.  As part of our continuing mission to provide you with exceptional heart care, our providers are all part of one team.  This team includes your primary Cardiologist (physician) and Advanced Practice Providers or APPs (Physician Assistants and Nurse Practitioners) who all work together to provide you with the care you need, when you need it.  Your next appointment:   1 year(s)  Provider:   Madonna Large, DO    We recommend signing up for the patient portal called MyChart.  Sign up information is provided on this After Visit Summary.  MyChart is used to connect with patients for Virtual Visits (Telemedicine).  Patients are able to view lab/test results, encounter notes, upcoming appointments, etc.  Non-urgent messages can be sent to your provider as well.   To learn more about what you can do with MyChart, go to [email or web address].   Other Instructions

## 2024-09-18 ENCOUNTER — Ambulatory Visit: Payer: Self-pay | Admitting: Cardiology

## 2024-09-18 LAB — BASIC METABOLIC PANEL WITH GFR
BUN/Creatinine Ratio: 18 (ref 9–20)
BUN: 18 mg/dL (ref 6–24)
CO2: 25 mmol/L (ref 20–29)
Calcium: 9.8 mg/dL (ref 8.7–10.2)
Chloride: 103 mmol/L (ref 96–106)
Creatinine, Ser: 0.99 mg/dL (ref 0.76–1.27)
Glucose: 103 mg/dL — ABNORMAL HIGH (ref 70–99)
Potassium: 4.7 mmol/L (ref 3.5–5.2)
Sodium: 142 mmol/L (ref 134–144)
eGFR: 91 mL/min/1.73 (ref >59)

## 2024-09-19 ENCOUNTER — Other Ambulatory Visit: Payer: Self-pay | Admitting: Cardiology

## 2024-09-19 ENCOUNTER — Encounter: Payer: Self-pay | Admitting: Cardiology

## 2024-09-19 DIAGNOSIS — Z01810 Encounter for preprocedural cardiovascular examination: Secondary | ICD-10-CM

## 2024-09-19 DIAGNOSIS — I10 Essential (primary) hypertension: Secondary | ICD-10-CM

## 2024-09-19 DIAGNOSIS — R0609 Other forms of dyspnea: Secondary | ICD-10-CM

## 2024-09-22 NOTE — Telephone Encounter (Signed)
 Thanks for checking up.  It needs to be OCT 2025, it should have been expires in 09/2025.   Shaylan Tutton [place], DO, FACC

## 2024-09-29 ENCOUNTER — Encounter (HOSPITAL_COMMUNITY): Payer: Self-pay

## 2024-10-01 ENCOUNTER — Ambulatory Visit (HOSPITAL_COMMUNITY)
Admission: RE | Admit: 2024-10-01 | Discharge: 2024-10-01 | Disposition: A | Discharge status: Home / Self Care | Source: Ambulatory Visit | Attending: Cardiology | Admitting: Cardiology

## 2024-10-01 DIAGNOSIS — R072 Precordial pain: Secondary | ICD-10-CM | POA: Insufficient documentation

## 2024-10-01 MED ORDER — IOHEXOL 350 MG/ML SOLN
100.0000 mL | Freq: Once | INTRAVENOUS | Status: AC | PRN
  Administered 2024-10-01: 100 mL via INTRAVENOUS

## 2024-10-01 MED ORDER — SODIUM CHLORIDE 0.9 % IV BOLUS
1000.0000 mL | Freq: Once | INTRAVENOUS | Status: AC
  Administered 2024-10-01: 1000 mL via INTRAVENOUS

## 2024-10-01 MED ORDER — NITROGLYCERIN 0.4 MG SL SUBL
0.8000 mg | SUBLINGUAL_TABLET | Freq: Once | SUBLINGUAL | Status: AC
  Administered 2024-10-01: 0.8 mg via SUBLINGUAL

## 2024-10-01 NOTE — Progress Notes (Signed)
 Dr. Mona made aware of patient's BP and gave verbal order to give patient 1 L of NaCl and recheck after the bolus was done.  Patient can be discharged after fluid bolus if the patient is feeling better, MAP is greater than 65 and systolic BP is greater than 90.

## 2024-10-01 NOTE — Progress Notes (Signed)
 Patient reports dizziness. BP taken after patient was give caffeine .

## 2024-10-03 ENCOUNTER — Other Ambulatory Visit: Payer: Self-pay | Admitting: Internal Medicine

## 2024-10-03 ENCOUNTER — Encounter: Payer: Self-pay | Admitting: Cardiology

## 2024-10-04 NOTE — Telephone Encounter (Signed)
 Requested Prescriptions  Pending Prescriptions Disp Refills   nortriptyline  (PAMELOR ) 75 MG capsule [Pharmacy Med Name: NORTRIPTYLINE  HCL 75 MG CAP] 90 capsule 0    Sig: TAKE 1 CAPSULE BY MOUTH AT BEDTIME.     Psychiatry:  Antidepressants - Heterocyclics (TCAs) Passed - 10/04/2024  9:05 AM      Passed - Valid encounter within last 6 months    Recent Outpatient Visits           4 months ago Acute midline low back pain with right-sided sciatica   [place] [facility] [place], Angeline ORN, NP   5 months ago Encounter for general adult medical examination with abnormal findings   [place] [facility] [place], Angeline ORN, NP       Future Appointments             In 5 months Kenneth Sherman, Kenneth BROCKS, MD [facility] [place]             celecoxib  (CELEBREX ) 200 MG capsule [Pharmacy Med Name: CELECOXIB  200 MG CAPSULE] 90 capsule 0    Sig: TAKE 1 CAPSULE BY MOUTH EVERY DAY     Analgesics:  COX2 Inhibitors Failed - 10/04/2024  9:05 AM      Failed - Manual Review: Labs are only required if the patient has taken medication for more than 8 weeks.      Passed - HGB in normal range and within 360 days    Hemoglobin  Date Value Ref Range Status  04/11/2024 13.2 13.2 - 17.1 g/dL Final  87/71/7981 85.1 13.0 - 17.7 g/dL Final         Passed - Cr in normal range and within 360 days    Creat  Date Value Ref Range Status  04/11/2024 1.05 0.70 - 1.30 mg/dL Final   Creatinine, Ser  Date Value Ref Range Status  09/17/2024 0.99 0.76 - 1.27 mg/dL Final   Creatinine, Urine  Date Value Ref Range Status  07/26/2023 39 20 - 320 mg/dL Final         Passed - HCT in normal range and within 360 days    HCT  Date Value Ref Range Status  04/11/2024 39.2 38.5 - 50.0 % Final   Hematocrit  Date Value Ref Range Status  12/07/2017 43.4 37.5 - 51.0 % Final         Passed - AST in normal range and within 360 days    AST  Date Value Ref Range Status  04/11/2024 20  10 - 35 U/L Final         Passed - ALT in normal range and within 360 days    ALT  Date Value Ref Range Status  04/11/2024 23 9 - 46 U/L Final         Passed - eGFR is 30 or above and within 360 days    GFR calc Af Amer  Date Value Ref Range Status  06/11/2018 >60 >60 mL/min Final    Comment:    (NOTE) The eGFR has been calculated using the CKD EPI equation. This calculation has not been validated in all clinical situations. eGFR's persistently <60 mL/min signify possible Chronic Kidney Disease.    GFR calc non Af Amer  Date Value Ref Range Status  06/11/2018 >60 >60 mL/min Final   GFR  Date Value Ref Range Status  12/30/2020 74.24 >60.00 mL/min Final    Comment:    Calculated using the CKD-EPI Creatinine Equation (2021)  eGFR  Date Value Ref Range Status  09/17/2024 91 >59 mL/min/1.73 Final         Passed - Patient is not pregnant      Passed - Valid encounter within last 12 months    Recent Outpatient Visits           4 months ago Acute midline low back pain with right-sided sciatica   [place] [facility] [place], Angeline ORN, NP   5 months ago Encounter for general adult medical examination with abnormal findings   [place] [facility] [place], Angeline ORN, NP       Future Appointments             In 5 months Kenneth Sherman, Kenneth BROCKS, MD [facility] [place]

## 2024-10-13 ENCOUNTER — Ambulatory Visit: Admitting: Internal Medicine

## 2024-10-13 VITALS — BP 110/72 | Ht 71.0 in | Wt 280.0 lb

## 2024-10-13 DIAGNOSIS — Z6839 Body mass index (BMI) 39.0-39.9, adult: Secondary | ICD-10-CM

## 2024-10-13 DIAGNOSIS — E1159 Type 2 diabetes mellitus with other circulatory complications: Secondary | ICD-10-CM | POA: Diagnosis not present

## 2024-10-13 DIAGNOSIS — E785 Hyperlipidemia, unspecified: Secondary | ICD-10-CM

## 2024-10-13 DIAGNOSIS — G8929 Other chronic pain: Secondary | ICD-10-CM

## 2024-10-13 DIAGNOSIS — M170 Bilateral primary osteoarthritis of knee: Secondary | ICD-10-CM

## 2024-10-13 DIAGNOSIS — E1165 Type 2 diabetes mellitus with hyperglycemia: Secondary | ICD-10-CM

## 2024-10-13 DIAGNOSIS — F411 Generalized anxiety disorder: Secondary | ICD-10-CM

## 2024-10-13 DIAGNOSIS — E66812 Obesity, class 2: Secondary | ICD-10-CM

## 2024-10-13 DIAGNOSIS — K219 Gastro-esophageal reflux disease without esophagitis: Secondary | ICD-10-CM | POA: Diagnosis not present

## 2024-10-13 DIAGNOSIS — E1169 Type 2 diabetes mellitus with other specified complication: Secondary | ICD-10-CM | POA: Diagnosis not present

## 2024-10-13 DIAGNOSIS — R519 Headache, unspecified: Secondary | ICD-10-CM

## 2024-10-13 DIAGNOSIS — N528 Other male erectile dysfunction: Secondary | ICD-10-CM

## 2024-10-13 DIAGNOSIS — I152 Hypertension secondary to endocrine disorders: Secondary | ICD-10-CM

## 2024-10-13 NOTE — Assessment & Plan Note (Signed)
 Complicated by morbid obesity A1c and urine microalbumin ordered Encouraged low-carb diet and exercise Not medicated Encourage routine eye exam Encouraged routine foot exam Clines flu or Prevnar

## 2024-10-13 NOTE — Assessment & Plan Note (Signed)
 Complicated by morbid obesity Encourage weight loss as this can help reduce reflux symptoms Continue celecoxib  200 mg daily

## 2024-10-13 NOTE — Assessment & Plan Note (Signed)
 Encouraged continued diet and exercise for weight loss

## 2024-10-13 NOTE — Progress Notes (Signed)
 Subjective:    Patient ID: Kenneth Sherman, male    DOB: 09/29/1971, 53 y.o.   MRN: [id number]  HPI  Patient presents to clinic today for follow-up of chronic conditions.  Frequent headaches: These occur a few times per month.  He is not sure what triggers them. He takes nortriptyline  as prescribed and sumatriptan  as needed.  He does not follow with neurology.  ED: Managed with tadalafil  as needed.  He does not follow with urology.  Anxiety: Situational.  He is not taking any medication for this at this time.  He is not seeing a therapist.  He denies depression, SI/HI.  GERD: He is not sure what triggers this.  He denies breakthrough on omeprazole .  Upper GI from 05/2022 reviewed.  HTN: His BP today is 110/72.  He is taking lisinopril  HCT and metoprolol  as prescribed.  ECG from 09/2024 reviewed.  DM2: His last A1c was 5.8%, 04/2024.  He is not taking any oral diabetic medication at this time. He does not check his sugars.  He checks his feet routinely.  His last eye exam was 06/2024.  Flu never.  Pneumovax never.  COVID x 3.  HLD: His last LDL was 63, triglycerides 52, 04/2024.  He denies myalgias on atorvastatin  and ezetimibe .  He tries to consume a low-fat diet.  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  He takes celecoxib  as prescribed.  X-ray from 10/2019 reviewed.  He does not follow with orthopedics.  Review of Systems     Past Medical History:  Diagnosis Date   Arthritis    Coronary atherosclerosis due to calcified coronary lesion    GERD (gastroesophageal reflux disease)    Heartburn    HLD (hyperlipidemia)    Hypertension    no meds   Hypogonadism in male    Right knee DJD    Thyroid  disease     Current Outpatient Medications  Medication Sig Dispense Refill   atorvastatin  (LIPITOR) 10 MG tablet TAKE 1 TABLET BY MOUTH EVERY DAY 90 tablet 1   celecoxib  (CELEBREX ) 200 MG capsule TAKE 1 CAPSULE BY MOUTH EVERY DAY 90 capsule 0   ezetimibe  (ZETIA ) 10 MG tablet TAKE 1 TABLET BY MOUTH EVERY  DAY 90 tablet 1   lisinopril -hydrochlorothiazide  (ZESTORETIC ) 10-12.5 MG tablet TAKE 1 TABLET BY MOUTH EVERY DAY 90 tablet 1   metoprolol  succinate (TOPROL -XL) 25 MG 24 hr tablet TAKE 1 TABLET (25 MG TOTAL) BY MOUTH DAILY. 90 tablet 3   metoprolol  tartrate (LOPRESSOR ) 50 MG tablet Take 1 tablet (50 mg total) by mouth once for 1 dose. Take 50 mg (1 tablet) two hours prior to CT scan. Hold your daily dose of metoprolol  for this day only. 1 tablet 0   nortriptyline  (PAMELOR ) 75 MG capsule TAKE 1 CAPSULE BY MOUTH AT BEDTIME. 90 capsule 0   omeprazole  (PRILOSEC) 40 MG capsule Take 1 capsule (40 mg total) by mouth daily before breakfast. 180 capsule 1   SUMAtriptan  (IMITREX ) 25 MG tablet TAKE 1 TABLET BY MOUTH AS NEEDED FOR MIGRAINE. MAY REPEAT IN 2 HOURS IF HEADACHE PERSISTS OR RECURS. 10 tablet 0   tadalafil  (CIALIS ) 5 MG tablet Take 1 tablet (5 mg total) by mouth daily. Take 5 mg daily, ok to take additional 10-15mg  boost dose 1 hour prior to sexual activity as needed 90 tablet 6   No current facility-administered medications for this visit.    No Known Allergies  Family History  Problem Relation Age of Onset   Heart attack Mother  Hypertension Mother    Hypertension Father    Heart attack Father    Hypertension Brother    Healthy Brother    Healthy Brother    Healthy Brother    Kidney disease Neg Hx    Prostate cancer Neg Hx    Bladder Cancer Neg Hx    Colon cancer Neg Hx     Social History   Socioeconomic History   Marital status: Married    Spouse name: Not on file   Number of children: Not on file   Years of education: 14   Highest education level: Associate degree: occupational, scientist, product/process development, or vocational program  Occupational History   Occupation: CAD Copywriter, Advertising: [employer]  Tobacco Use   Smoking status: Never   Smokeless tobacco: Never  Vaping Use   Vaping status: Never Used  Substance and Sexual Activity   Alcohol use: Yes    Alcohol/week: 2.0  standard drinks of alcohol    Types: 1 Glasses of wine, 1 Cans of beer per week    Comment: occasionally   Drug use: No   Sexual activity: Yes    Birth control/protection: Condom  Other Topics Concern   Not on file  Social History Narrative   Regular exercise-no   Caffeine  Use-yes   Social Drivers of Health   Financial Resource Strain: Low Risk  (10/13/2024)   Overall Financial Resource Strain (CARDIA)    Difficulty of Paying Living Expenses: Not hard at all  Food Insecurity: No Food Insecurity (10/13/2024)   Hunger Vital Sign    Worried About Running Out of Food in the Last Year: Never true    Ran Out of Food in the Last Year: Never true  Transportation Needs: No Transportation Needs (10/13/2024)   PRAPARE - Administrator, Civil Service (Medical): No    Lack of Transportation (Non-Medical): No  Physical Activity: Insufficiently Active (10/13/2024)   Exercise Vital Sign    Days of Exercise per Week: 2 days    Minutes of Exercise per Session: 20 min  Stress: No Stress Concern Present (10/13/2024)   [employer] of Occupational Health - Occupational Stress Questionnaire    Feeling of Stress: Only a little  Social Connections: Moderately Isolated (10/13/2024)   Social Connection and Isolation Panel    Frequency of Communication with Friends and Family: More than three times a week    Frequency of Social Gatherings with Friends and Family: Twice a week    Attends Religious Services: Never    Database Administrator or Organizations: No    Attends Engineer, Structural: Not on file    Marital Status: Married  Intimate Partner Violence: Unknown (03/17/2022)   Received from [facility]   HITS    Physically Hurt: Not on file    Insult or Talk Down To: Not on file    Threaten Physical Harm: Not on file    Scream or Curse: Not on file     Constitutional: Patient reports intermittent headaches.  Denies fever, malaise, fatigue, or abrupt weight changes.   HEENT: Denies eye pain, eye redness, ear pain, ringing in the ears, wax buildup, runny nose, nasal congestion, bloody nose, or sore throat. Respiratory: Denies difficulty breathing, shortness of breath, cough or sputum production.   Cardiovascular: Patient reports intermittent chest pain.  Denies chest tightness, palpitations or swelling in the hands or feet.  Gastrointestinal: Denies abdominal pain, bloating, constipation, diarrhea or blood in the stool.  GU: Patient reports erectile dysfunction.  Denies urgency, frequency, pain with urination, burning sensation, blood in urine, odor or discharge. Musculoskeletal: Patient reports joint pain..  Denies decrease in range of motion, difficulty with gait, muscle pain or joint swelling.  Skin: Denies redness, rashes, lesions or ulcercations.  Neurological: Denies dizziness, difficulty with memory, difficulty with speech or problems with balance and coordination.  Psych: Patient has a history of anxiety.  Denies depression, SI/HI.  No other specific complaints in a complete review of systems (except as listed in HPI above).  Objective:   Physical Exam BP 110/72 (BP Location: Left Arm, Patient Position: Sitting, Cuff Size: Large)   Ht 5' 11 (1.803 m)   Wt 280 lb (127 kg)   BMI 39.05 kg/m    Wt Readings from Last 3 Encounters:  09/17/24 266 lb (120.7 kg)  05/13/24 264 lb 3.2 oz (119.8 kg)  04/11/24 258 lb (117 kg)    General: Appears his stated age, obese in NAD. Skin: Warm, dry and intact.  No ulcerations noted. HEENT: Head: normal shape and size; Eyes: sclera white, no icterus, conjunctiva pink, PERRLA and EOMs intact;  Cardiovascular: Normal rate and rhythm. S1,S2 noted.  No murmur, rubs or gallops noted. No JVD. Trace BLE edema. No carotid bruits noted. Pulmonary/Chest: Normal effort and positive vesicular breath sounds. No respiratory distress. No wheezes, rales or ronchi noted.  Abdomen: Normal bowel sounds.  Musculoskeletal:  Strength 5/5 BUE/BLE.  No difficulty with gait.  Neurological: Alert and oriented. Cranial nerves II-XII grossly intact. Coordination normal.  Psychiatric: Mood and affect normal. Behavior is normal. Judgment and thought content normal.    BMET    Component Value Date/Time   NA 142 09/17/2024 1705   K 4.7 09/17/2024 1705   CL 103 09/17/2024 1705   CO2 25 09/17/2024 1705   GLUCOSE 103 (H) 09/17/2024 1705   GLUCOSE 94 04/11/2024 1547   BUN 18 09/17/2024 1705   CREATININE 0.99 09/17/2024 1705   CREATININE 1.05 04/11/2024 1547   CALCIUM  9.8 09/17/2024 1705   GFRNONAA >60 06/11/2018 1018   GFRAA >60 06/11/2018 1018    Lipid Panel     Component Value Date/Time   CHOL 137 04/11/2024 1547   TRIG 52 04/11/2024 1547   HDL 61 04/11/2024 1547   CHOLHDL 2.2 04/11/2024 1547   VLDL 19.8 03/26/2020 0849   LDLCALC 63 04/11/2024 1547    CBC    Component Value Date/Time   WBC 9.3 04/11/2024 1547   RBC 4.63 04/11/2024 1547   HGB 13.2 04/11/2024 1547   HGB 14.8 12/07/2017 0847   HCT 39.2 04/11/2024 1547   HCT 43.4 12/07/2017 0847   PLT 262 04/11/2024 1547   MCV 84.7 04/11/2024 1547   MCH 28.5 04/11/2024 1547   MCHC 33.7 04/11/2024 1547   RDW 13.2 04/11/2024 1547   LYMPHSABS 2.2 12/30/2020 1600   MONOABS 0.5 12/30/2020 1600   EOSABS 0.3 12/30/2020 1600   BASOSABS 0.1 12/30/2020 1600    Hgb A1C Lab Results  Component Value Date   HGBA1C 5.8 (H) 04/11/2024           Assessment & Plan:    RTC in 6 months, for your annual exam Angeline Laura, NP

## 2024-10-13 NOTE — Assessment & Plan Note (Signed)
 Complicated by morbid obesity Controlled on lisinopril  HCT 10-12.5 and metoprolol  25 mg daily Reinforced DASH diet and exercise for weight loss C-Met ordered

## 2024-10-13 NOTE — Assessment & Plan Note (Signed)
 Complicated by morbid obesity Avoid foods that trigger reflux Encourage weight loss as this can help reduce reflux symptoms Continue omeprazole  40 mg daily

## 2024-10-13 NOTE — Assessment & Plan Note (Signed)
Not medicated Support offered 

## 2024-10-13 NOTE — Assessment & Plan Note (Signed)
Continue tadalafil 5mg daily

## 2024-10-13 NOTE — Patient Instructions (Signed)

## 2024-10-13 NOTE — Assessment & Plan Note (Signed)
 Continue nortriptyline  75 mg at bedtime and sumatriptan  25 mg as needed

## 2024-10-13 NOTE — Assessment & Plan Note (Signed)
 Complicated by morbid obesity C-Met and lipid profile ordered Encouraged him to consume a low-fat diet Continue atorvastatin  10 mg and ezetimibe  10 mg daily

## 2024-10-14 ENCOUNTER — Ambulatory Visit: Payer: Self-pay | Admitting: Internal Medicine

## 2024-10-14 LAB — CBC
HCT: 43 % (ref 38.5–50.0)
Hemoglobin: 14.3 g/dL (ref 13.2–17.1)
MCH: 28.5 pg (ref 27.0–33.0)
MCHC: 33.3 g/dL (ref 32.0–36.0)
MCV: 85.7 fL (ref 80.0–100.0)
MPV: 8.9 fL (ref 7.5–12.5)
Platelets: 285 Thousand/uL (ref 140–400)
RBC: 5.02 Million/uL (ref 4.20–5.80)
RDW: 13.3 % (ref 11.0–15.0)
WBC: 9.4 Thousand/uL (ref 3.8–10.8)

## 2024-10-14 LAB — LIPID PANEL
Cholesterol: 139 mg/dL (ref <200)
HDL: 58 mg/dL (ref >=40)
LDL Cholesterol (Calc): 64 mg/dL
Non-HDL Cholesterol (Calc): 81 mg/dL (ref <130)
Total CHOL/HDL Ratio: 2.4 (calc) (ref <5.0)
Triglycerides: 89 mg/dL (ref <150)

## 2024-10-14 LAB — COMPREHENSIVE METABOLIC PANEL WITH GFR
AG Ratio: 1.8 (calc) (ref 1.0–2.5)
ALT: 30 U/L (ref 9–46)
AST: 23 U/L (ref 10–35)
Albumin: 4.7 g/dL (ref 3.6–5.1)
Alkaline phosphatase (APISO): 69 U/L (ref 35–144)
BUN: 17 mg/dL (ref 7–25)
CO2: 29 mmol/L (ref 20–32)
Calcium: 9.6 mg/dL (ref 8.6–10.3)
Chloride: 102 mmol/L (ref 98–110)
Creat: 0.87 mg/dL (ref 0.70–1.30)
Globulin: 2.6 g/dL (ref 1.9–3.7)
Glucose, Bld: 98 mg/dL (ref 65–139)
Potassium: 4.2 mmol/L (ref 3.5–5.3)
Sodium: 140 mmol/L (ref 135–146)
Total Bilirubin: 0.4 mg/dL (ref 0.2–1.2)
Total Protein: 7.3 g/dL (ref 6.1–8.1)
eGFR: 103 mL/min/1.73m2 (ref >=60)

## 2024-10-14 LAB — MICROALBUMIN / CREATININE URINE RATIO
Creatinine, Urine: 129 mg/dL (ref 20–320)
Microalb Creat Ratio: 16 mg/g{creat} (ref <30)
Microalb, Ur: 2 mg/dL

## 2024-10-14 LAB — HEMOGLOBIN A1C
Hgb A1c MFr Bld: 5.9 % — ABNORMAL HIGH (ref <5.7)
Mean Plasma Glucose: 123 mg/dL
eAG (mmol/L): 6.8 mmol/L

## 2024-10-14 MED ORDER — METFORMIN HCL 500 MG PO TABS: 500.0000 mg | ORAL_TABLET | Freq: Every day | ORAL | 1 refills | Status: AC

## 2024-10-14 NOTE — Addendum Note (Signed)
 Addended by: ANTONETTE ANGELINE ORN on: 10/14/2024 02:25 PM   Modules accepted: Orders

## 2024-10-26 ENCOUNTER — Other Ambulatory Visit: Payer: Self-pay | Admitting: Internal Medicine

## 2024-10-28 NOTE — Telephone Encounter (Signed)
 Requested Prescriptions  Pending Prescriptions Disp Refills   ezetimibe  (ZETIA ) 10 MG tablet [Pharmacy Med Name: EZETIMIBE  10 MG TABLET] 90 tablet 3    Sig: TAKE 1 TABLET BY MOUTH EVERY DAY     Cardiovascular:  Antilipid - Sterol Transport Inhibitors Failed - 10/28/2024  1:05 PM      Failed - Lipid Panel in normal range within the last 12 months    Cholesterol  Date Value Ref Range Status  10/13/2024 139 <200 mg/dL Final   LDL Cholesterol (Calc)  Date Value Ref Range Status  10/13/2024 64 mg/dL (calc) Final    Comment:    Reference range: <100 . Desirable range <100 mg/dL for primary prevention;   <70 mg/dL for patients with CHD or diabetic patients  with > or = 2 CHD risk factors. SABRA LDL-C is now calculated using the Martin-Hopkins  calculation, which is a validated novel method providing  better accuracy than the Friedewald equation in the  estimation of LDL-C.  Gladis APPLETHWAITE et al. SANDREA. 7986;689(80): 2061-2068  (http://education.[facility].com/faq/FAQ164)    Direct LDL  Date Value Ref Range Status  06/04/2018 157.0 mg/dL Final    Comment:    Optimal:  <100 mg/dLNear or Above Optimal:  100-129 mg/dLBorderline High:  130-159 mg/dLHigh:  160-189 mg/dLVery High:  >190 mg/dL   HDL  Date Value Ref Range Status  10/13/2024 58 > OR = 40 mg/dL Final   Triglycerides  Date Value Ref Range Status  10/13/2024 89 <150 mg/dL Final         Passed - AST in normal range and within 360 days    AST  Date Value Ref Range Status  10/13/2024 23 10 - 35 U/L Final         Passed - ALT in normal range and within 360 days    ALT  Date Value Ref Range Status  10/13/2024 30 9 - 46 U/L Final         Passed - Patient is not pregnant      Passed - Valid encounter within last 12 months    Recent Outpatient Visits           2 weeks ago Type 2 diabetes mellitus with hyperglycemia, without long-term current use of insulin  [facility])   [facility] [place], Angeline ORN, NP   5 months ago Acute midline low back pain with right-sided sciatica   [place] [facility] [place], Angeline ORN, NP   6 months ago Encounter for general adult medical examination with abnormal findings   [place] [facility] [place], Angeline ORN, NP       Future Appointments             In 1 week Michele, [place], DO [facility] [employer] [facility] [employer]. [facility], [facility]   In 5 months Francisca, Redell BROCKS, MD [facility] [place]             atorvastatin  (LIPITOR) 10 MG tablet [Pharmacy Med Name: ATORVASTATIN  10 MG TABLET] 90 tablet 3    Sig: TAKE 1 TABLET BY MOUTH EVERY DAY     Cardiovascular:  Antilipid - Statins Failed - 10/28/2024  1:05 PM      Failed - Lipid Panel in normal range within the last 12 months    Cholesterol  Date Value Ref Range Status  10/13/2024 139 <200 mg/dL Final   LDL Cholesterol (Calc)  Date Value  Ref Range Status  10/13/2024 64 mg/dL (calc) Final    Comment:    Reference range: <100 . Desirable range <100 mg/dL for primary prevention;   <70 mg/dL for patients with CHD or diabetic patients  with > or = 2 CHD risk factors. SABRA LDL-C is now calculated using the Martin-Hopkins  calculation, which is a validated novel method providing  better accuracy than the Friedewald equation in the  estimation of LDL-C.  Gladis APPLETHWAITE et al. SANDREA. 7986;689(80): 2061-2068  (http://education.[facility].com/faq/FAQ164)    Direct LDL  Date Value Ref Range Status  06/04/2018 157.0 mg/dL Final    Comment:    Optimal:  <100 mg/dLNear or Above Optimal:  100-129 mg/dLBorderline High:  130-159 mg/dLHigh:  160-189 mg/dLVery High:  >190 mg/dL   HDL  Date Value Ref Range Status  10/13/2024 58 > OR = 40 mg/dL Final   Triglycerides  Date Value Ref Range Status  10/13/2024 89 <150 mg/dL Final         Passed - Patient is not pregnant      Passed - Valid encounter within last 12 months    Recent Outpatient Visits            2 weeks ago Type 2 diabetes mellitus with hyperglycemia, without long-term current use of insulin  [facility])   [place] [facility] [place], Angeline ORN, NP   5 months ago Acute midline low back pain with right-sided sciatica   [place] [facility] [place], Angeline ORN, NP   6 months ago Encounter for general adult medical examination with abnormal findings   [place] [facility] [place], Angeline ORN, NP       Future Appointments             In 1 week Michele, [place], DO [facility] [employer] [facility] [employer]. [facility], [facility]   In 5 months [place], Redell BROCKS, MD [facility] [place]

## 2024-11-03 ENCOUNTER — Ambulatory Visit (HOSPITAL_COMMUNITY)
Admission: RE | Admit: 2024-11-03 | Discharge: 2024-11-03 | Disposition: A | Discharge status: Home / Self Care | Source: Ambulatory Visit | Attending: Family Medicine | Admitting: Family Medicine

## 2024-11-03 DIAGNOSIS — R072 Precordial pain: Secondary | ICD-10-CM | POA: Insufficient documentation

## 2024-11-03 MED ORDER — PERFLUTREN LIPID MICROSPHERE
1.0000 mL | INTRAVENOUS | Status: AC | PRN
  Administered 2024-11-03: 2 mL via INTRAVENOUS

## 2024-11-04 LAB — ECHOCARDIOGRAM COMPLETE
Area-P 1/2: 4.6 cm2
S' Lateral: 3.58 cm

## 2024-11-05 ENCOUNTER — Encounter: Payer: Self-pay | Admitting: Cardiology

## 2024-11-05 ENCOUNTER — Ambulatory Visit: Attending: Cardiology | Admitting: Cardiology

## 2024-11-05 VITALS — BP 120/80 | HR 98 | Resp 17 | Ht 71.0 in | Wt 283.0 lb

## 2024-11-05 DIAGNOSIS — E782 Mixed hyperlipidemia: Secondary | ICD-10-CM | POA: Diagnosis not present

## 2024-11-05 DIAGNOSIS — Z8249 Family history of ischemic heart disease and other diseases of the circulatory system: Secondary | ICD-10-CM

## 2024-11-05 DIAGNOSIS — E66812 Obesity, class 2: Secondary | ICD-10-CM

## 2024-11-05 DIAGNOSIS — R931 Abnormal findings on diagnostic imaging of heart and coronary circulation: Secondary | ICD-10-CM

## 2024-11-05 DIAGNOSIS — I2510 Atherosclerotic heart disease of native coronary artery without angina pectoris: Secondary | ICD-10-CM

## 2024-11-05 DIAGNOSIS — E6609 Other obesity due to excess calories: Secondary | ICD-10-CM

## 2024-11-05 DIAGNOSIS — I10 Essential (primary) hypertension: Secondary | ICD-10-CM | POA: Diagnosis not present

## 2024-11-05 DIAGNOSIS — Z6839 Body mass index (BMI) 39.0-39.9, adult: Secondary | ICD-10-CM

## 2024-11-05 NOTE — Progress Notes (Signed)
 Cardiology Office Note:  .   Date:  11/05/2024  ID:  Kenneth Sherman, DOB May 22, 1971, MRN [id number] PCP:  Antonette Angeline ORN, NP  Former Cardiology Providers: None [facility] Providers Cardiologist:  Madonna Large, DO , [facility] (established care 09/09/2020) Electrophysiologist:  None  Click to update primary MD,subspecialty MD or APP then REFRESH:1}    Chief Complaint  Patient presents with   Precordial pain   Follow-up   Results    Cardiac CT scan    History of Present Illness: .   Kenneth Sherman is a 53 y.o. Caucasian male whose past medical history and cardiovascular risk factors includes: Prediabetes, Family history of premature coronary artery disease, coronary artery calcification, hypertension, hyperlipidemia, obesity.   In 2021 patient was referred to the practice for preoperative risk assessment prior to upcoming noncardiac surgery given his strong family history of premature CAD.  He underwent appropriate cardiovascular testing as outlined below.    During his last office visit in October 2025 patient was endorsing precordial discomfort with over exertional activities.  His EKG was nonischemic but given multiple cardiovascular risk factors including prediabetes, strong family history of CAD, and obesity shared decision was to proceed with echo and coronary CTA.  Results reviewed with him in detail and mentioned below for further reference.  Patient denies anginal chest pain or heart failure symptoms. Overall function capacity remains relatively stable. Outside labs from 10/13/2024 independently reviewed from [facility] database.   Images of the coronary CTA also reviewed with the patient.  Family history of premature coronary disease (mom passed at the age of 49 and dad passed away at the age of 80 due to heart disease).   Review of Systems: .   Review of Systems  Constitutional: Positive for weight gain.  Cardiovascular:  Negative for chest pain, claudication, irregular  heartbeat, leg swelling, near-syncope, orthopnea, palpitations, paroxysmal nocturnal dyspnea and syncope.  Respiratory:  Negative for shortness of breath.   Hematologic/Lymphatic: Negative for bleeding problem.    Studies Reviewed:    Echocardiogram: 10/2024  1. Left ventricular ejection fraction, by estimation, is 60 to 65%. The  left ventricle has normal function. The left ventricle has no regional  wall motion abnormalities. Left ventricular diastolic parameters were  normal.   2. Right ventricular systolic function is normal. The right ventricular  size is normal. Tricuspid regurgitation signal is inadequate for assessing  PA pressure.   3. The mitral valve is normal in structure. No evidence of mitral valve  regurgitation. No evidence of mitral stenosis.   4. The aortic valve is tricuspid. Aortic valve regurgitation is not  visualized. No aortic stenosis is present.   5. The inferior vena cava is normal in size with greater than 50%  respiratory variability, suggesting right atrial pressure of 3 mmHg.   6. Ascending aorta measurements are within normal limits for age when  indexed to body surface area.    Stress Testing: Lexiscan (Walking with cora Pin) Sestamibi Stress Test 09/15/2020: Low risk.    Coronary artery calcification scoring performed on 09/24/2020 at [facility]: Total calcium  score: 20 AU 50-75th percentile.  Noncardiac findings: Small sliding hiatal hernia.  CCTA 10/01/2024 1. Coronary artery calcium  score 69.5 Agatston units. This places the patient in the 77th percentile for age and gender, suggesting intermediate risk for future cardiac events.  2. No significant stenosis in the RCA, AV LCx, or proximal to mid LAD. There is a small ramus that appears to be diffusely diseased, not a target  for intervention. The distal LAD is small and may have diffuse disease, but not a target for intervention. Recommend aggressive risk factor modification.   3.   Radiology over read: Tiny hiatal hernia  RADIOLOGY: NA  Risk Assessment/Calculations:   NA   Labs:       Latest Ref Rng & Units 10/13/2024    3:11 PM 04/11/2024    3:47 PM 04/06/2023    1:44 PM  CBC  WBC 3.8 - 10.8 Thousand/uL 9.4  9.3  9.2   Hemoglobin 13.2 - 17.1 g/dL 85.6  86.7  86.2   Hematocrit 38.5 - 50.0 % 43.0  39.2  40.3   Platelets 140 - 400 Thousand/uL 285  262  301        Latest Ref Rng & Units 10/13/2024    3:11 PM 09/17/2024    5:05 PM 04/11/2024    3:47 PM  BMP  Glucose 65 - 139 mg/dL 98  896  94   BUN 7 - 25 mg/dL 17  18  25    Creatinine 0.70 - 1.30 mg/dL 9.12  9.00  8.94   BUN/Creat Ratio 6 - 22 (calc) SEE NOTE:  18  SEE NOTE:   Sodium 135 - 146 mmol/L 140  142  138   Potassium 3.5 - 5.3 mmol/L 4.2  4.7  4.3   Chloride 98 - 110 mmol/L 102  103  102   CO2 20 - 32 mmol/L 29  25  29    Calcium  8.6 - 10.3 mg/dL 9.6  9.8  9.2       Latest Ref Rng & Units 10/13/2024    3:11 PM 09/17/2024    5:05 PM 04/11/2024    3:47 PM  CMP  Glucose 65 - 139 mg/dL 98  896  94   BUN 7 - 25 mg/dL 17  18  25    Creatinine 0.70 - 1.30 mg/dL 9.12  9.00  8.94   Sodium 135 - 146 mmol/L 140  142  138   Potassium 3.5 - 5.3 mmol/L 4.2  4.7  4.3   Chloride 98 - 110 mmol/L 102  103  102   CO2 20 - 32 mmol/L 29  25  29    Calcium  8.6 - 10.3 mg/dL 9.6  9.8  9.2   Total Protein 6.1 - 8.1 g/dL 7.3   7.0   Total Bilirubin 0.2 - 1.2 mg/dL 0.4   0.3   AST 10 - 35 U/L 23   20   ALT 9 - 46 U/L 30   23     Lab Results  Component Value Date   CHOL 139 10/13/2024   HDL 58 10/13/2024   LDLCALC 64 10/13/2024   LDLDIRECT 157.0 06/04/2018   TRIG 89 10/13/2024   CHOLHDL 2.4 10/13/2024   No results for input(s): LIPOA in the last 8760 hours. No components found for: NTPROBNP No results for input(s): PROBNP in the last 8760 hours. No results for input(s): TSH in the last 8760 hours.  Physical Exam:    Today's Vitals   11/05/24 0822  BP: 120/80  Pulse: 98  Resp: 17  SpO2: 95%   Weight: 283 lb (128.4 kg)  Height: 5' 11 (1.803 m)   Body mass index is 39.47 kg/m. Wt Readings from Last 3 Encounters:  11/05/24 283 lb (128.4 kg)  10/13/24 280 lb (127 kg)  09/17/24 266 lb (120.7 kg)    Physical Exam  Constitutional: No distress.  hemodynamically stable  Neck: No JVD  present.  Cardiovascular: Normal rate, regular rhythm, S1 normal and S2 normal. Exam reveals no gallop, no S3 and no S4.  No murmur heard. Pulmonary/Chest: Effort normal and breath sounds normal. No stridor. He has no wheezes. He has no rales.  Musculoskeletal:        General: No edema.     Cervical back: Neck supple.  Skin: Skin is warm.     Impression & Recommendation(s):  Impression:   ICD-10-CM   1. Atherosclerosis of native coronary artery of native heart without angina pectoris  I25.10     2. Agatston coronary artery calcium  score less than 100  R93.1     3. Mixed hyperlipidemia  E78.2     4. Benign hypertension  I10     5. Family history of premature CAD  Z82.49     6. Class 2 obesity due to excess calories without serious comorbidity with body mass index (BMI) of 39.0 to 39.9 in adult  E66.812    E66.09    Z68.39         Recommendation(s):  Atherosclerosis of native coronary artery of native heart without angina pectoris Agatston coronary artery calcium  score less than 100 Denies anginal chest pain or heart failure symptoms. Coronary CTA October 2025: Coronary calcium  score 69.5, no significant proximal disease.  Noted to have disease in the ramus which is small caliber in the distal LAD.  Medical management recommended. Clinically he is asymptomatic at this time. Continue Toprol -XL 25 mg p.o. daily. Continue Lipitor 10 mg p.o. daily. Continue Zetia  10 mg p.o. daily. Most recent labs from November 2025 reviewed LDL and triglycerides are at goal   Mixed hyperlipidemia Continue Lipitor and Zetia . LDL and triglyceride levels are at goal. Labs 10/13/2024 independently  reviewed and mentioned above for further reference  Benign hypertension Office blood pressures are very well-controlled. Continue lisinopril /hydrochlorothiazide  10/12.5 mg p.o. daily  Class 2 obesity due to excess calories without serious comorbidity with body mass index (BMI) of 39.0 to 39.9 in adult Body mass index is 39.47 kg/m. I reviewed with him importance of diet, regular physical activity/exercise, weight loss.   Patient is educated on the importance of increasing physical activity gradually as tolerated with a goal of moderate intensity exercise for 30 minutes a day 5 days a week.  Orders Placed:  No orders of the defined types were placed in this encounter.    Final Medication List:    No orders of the defined types were placed in this encounter.   There are no discontinued medications.    Current Outpatient Medications:    atorvastatin  (LIPITOR) 10 MG tablet, TAKE 1 TABLET BY MOUTH EVERY DAY, Disp: 90 tablet, Rfl: 3   celecoxib  (CELEBREX ) 200 MG capsule, TAKE 1 CAPSULE BY MOUTH EVERY DAY, Disp: 90 capsule, Rfl: 0   ezetimibe  (ZETIA ) 10 MG tablet, TAKE 1 TABLET BY MOUTH EVERY DAY, Disp: 90 tablet, Rfl: 3   lisinopril -hydrochlorothiazide  (ZESTORETIC ) 10-12.5 MG tablet, TAKE 1 TABLET BY MOUTH EVERY DAY, Disp: 90 tablet, Rfl: 1   metFORMIN  (GLUCOPHAGE ) 500 MG tablet, Take 1 tablet (500 mg total) by mouth daily with breakfast., Disp: 90 tablet, Rfl: 1   metoprolol  succinate (TOPROL -XL) 25 MG 24 hr tablet, TAKE 1 TABLET (25 MG TOTAL) BY MOUTH DAILY., Disp: 90 tablet, Rfl: 3   nortriptyline  (PAMELOR ) 75 MG capsule, TAKE 1 CAPSULE BY MOUTH AT BEDTIME., Disp: 90 capsule, Rfl: 0   omeprazole  (PRILOSEC) 40 MG capsule, Take 1 capsule (40 mg total) by  mouth daily before breakfast., Disp: 180 capsule, Rfl: 1   SUMAtriptan  (IMITREX ) 25 MG tablet, TAKE 1 TABLET BY MOUTH AS NEEDED FOR MIGRAINE. MAY REPEAT IN 2 HOURS IF HEADACHE PERSISTS OR RECURS., Disp: 10 tablet, Rfl: 0   tadalafil   (CIALIS ) 5 MG tablet, Take 1 tablet (5 mg total) by mouth daily. Take 5 mg daily, ok to take additional 10-15mg  boost dose 1 hour prior to sexual activity as needed, Disp: 90 tablet, Rfl: 6  Consent:   NA  Disposition:   1 year follow-up sooner if needed  His questions and concerns were addressed to his satisfaction. He voices understanding of the recommendations provided during this encounter.    Signed, Madonna Michele HAS, [facility]  A Division of [facility] [address], [place], [place] [address]  11/05/2024 10:17 AM

## 2024-11-05 NOTE — Patient Instructions (Signed)
 Medication Instructions:  Your physician recommends that you continue on your current medications as directed. Please refer to the Current Medication list given to you today.  *If you need a refill on your cardiac medications before your next appointment, please call your pharmacy*  Lab Work: None ordered If you have labs (blood work) drawn today and your tests are completely normal, you will receive your results only by: MyChart Message (if you have MyChart) OR A paper copy in the mail If you have any lab test that is abnormal or we need to change your treatment, we will call you to review the results.  Testing/Procedures: None ordered  Follow-Up: At [facility], you and your health needs are our priority.  As part of our continuing mission to provide you with exceptional heart care, our providers are all part of one team.  This team includes your primary Cardiologist (physician) and Advanced Practice Providers or APPs (Physician Assistants and Nurse Practitioners) who all work together to provide you with the care you need, when you need it.  Your next appointment:   1 year(s)  Provider:   Madonna Large, DO    We recommend signing up for the patient portal called MyChart.  Sign up information is provided on this After Visit Summary.  MyChart is used to connect with patients for Virtual Visits (Telemedicine).  Patients are able to view lab/test results, encounter notes, upcoming appointments, etc.  Non-urgent messages can be sent to your provider as well.   To learn more about what you can do with MyChart, go to [email or web address].

## 2024-12-08 ENCOUNTER — Other Ambulatory Visit: Payer: Self-pay | Admitting: Urology

## 2024-12-16 ENCOUNTER — Encounter: Payer: Self-pay | Admitting: Internal Medicine

## 2024-12-16 MED ORDER — OMEPRAZOLE 40 MG PO CPDR: 40.0000 mg | DELAYED_RELEASE_CAPSULE | Freq: Every day | ORAL | 1 refills | Status: AC

## 2024-12-19 ENCOUNTER — Encounter: Payer: Self-pay | Admitting: Internal Medicine

## 2024-12-24 ENCOUNTER — Ambulatory Visit: Admitting: Internal Medicine

## 2024-12-24 ENCOUNTER — Encounter: Payer: Self-pay | Admitting: Internal Medicine

## 2024-12-24 ENCOUNTER — Ambulatory Visit: Payer: Self-pay | Admitting: Internal Medicine

## 2024-12-24 ENCOUNTER — Ambulatory Visit
Admission: RE | Admit: 2024-12-24 | Discharge: 2024-12-24 | Disposition: A | Discharge status: Home / Self Care | Source: Ambulatory Visit | Attending: Internal Medicine | Admitting: Internal Medicine

## 2024-12-24 VITALS — BP 120/74 | Ht 71.0 in | Wt 288.0 lb

## 2024-12-24 DIAGNOSIS — E1165 Type 2 diabetes mellitus with hyperglycemia: Secondary | ICD-10-CM | POA: Diagnosis not present

## 2024-12-24 DIAGNOSIS — Z7984 Long term (current) use of oral hypoglycemic drugs: Secondary | ICD-10-CM

## 2024-12-24 DIAGNOSIS — M79642 Pain in left hand: Secondary | ICD-10-CM | POA: Insufficient documentation

## 2024-12-24 DIAGNOSIS — G8929 Other chronic pain: Secondary | ICD-10-CM | POA: Insufficient documentation

## 2024-12-24 MED ORDER — CELECOXIB 200 MG PO CAPS: 200.0000 mg | ORAL_CAPSULE | Freq: Every day | ORAL | 1 refills | Status: AC

## 2024-12-24 MED ORDER — PREDNISONE 10 MG PO TABS: ORAL_TABLET | ORAL | 0 refills | Status: AC

## 2024-12-24 NOTE — Progress Notes (Signed)
 "  Subjective:    Patient ID: Kenneth Sherman, male    DOB: January 01, 1971, 54 y.o.   MRN: [id number]  HPI  Discussed the use of AI scribe software for clinical note transcription with the patient, who gave verbal consent to proceed.  Rameses Ou is a 54 year old male who presents with left hand pain.  He has been experiencing sharp, deep pain in his left hand, which began in the thumb area several months ago and has recently spread to the other side of the hand over the past one and a half to two weeks. The pain radiates through the wrist and is exacerbated by activities such as grabbing objects and steering a wheel, which he describes as 'unbearable sometimes'. No swelling, redness, numbness, or tingling is present. He has been icing the area to manage symptoms.  He has also tried ibuprofen OTC.  He is currently out of celebrex , which he was taking for pain management, and is unsure if he has a prescription to refill it. He has not had any prior x-rays of the hand and has not experienced any fractures to his knowledge.  He also wants to discuss the metformin  that he is taking for his diabetes.  His last A1c was 5.9%, November 2025.  He notes that it initially helped with weight loss but not anymore. He does not check his blood sugars regularly.  He has been considering trying a GLP-1.      Review of Systems     Past Medical History:  Diagnosis Date   Arthritis    Coronary atherosclerosis due to calcified coronary lesion    GERD (gastroesophageal reflux disease)    Heartburn    HLD (hyperlipidemia)    Hypertension    no meds   Hypogonadism in male    Right knee DJD    Thyroid  disease     Current Outpatient Medications  Medication Sig Dispense Refill   atorvastatin  (LIPITOR) 10 MG tablet TAKE 1 TABLET BY MOUTH EVERY DAY 90 tablet 3   celecoxib  (CELEBREX ) 200 MG capsule TAKE 1 CAPSULE BY MOUTH EVERY DAY 90 capsule 0   ezetimibe  (ZETIA ) 10 MG tablet TAKE 1 TABLET BY MOUTH EVERY DAY 90  tablet 3   lisinopril -hydrochlorothiazide  (ZESTORETIC ) 10-12.5 MG tablet TAKE 1 TABLET BY MOUTH EVERY DAY 90 tablet 1   metFORMIN  (GLUCOPHAGE ) 500 MG tablet Take 1 tablet (500 mg total) by mouth daily with breakfast. 90 tablet 1   metoprolol  succinate (TOPROL -XL) 25 MG 24 hr tablet TAKE 1 TABLET (25 MG TOTAL) BY MOUTH DAILY. 90 tablet 3   nortriptyline  (PAMELOR ) 75 MG capsule TAKE 1 CAPSULE BY MOUTH AT BEDTIME. 90 capsule 0   omeprazole  (PRILOSEC) 40 MG capsule Take 1 capsule (40 mg total) by mouth daily before breakfast. 180 capsule 1   SUMAtriptan  (IMITREX ) 25 MG tablet TAKE 1 TABLET BY MOUTH AS NEEDED FOR MIGRAINE. MAY REPEAT IN 2 HOURS IF HEADACHE PERSISTS OR RECURS. 10 tablet 0   tadalafil  (CIALIS ) 5 MG tablet PLEASE SEE ATTACHED FOR DETAILED DIRECTIONS 90 tablet 3   No current facility-administered medications for this visit.    No Known Allergies  Family History  Problem Relation Age of Onset   Heart attack Mother    Hypertension Mother    Heart disease Mother    Obesity Mother    Hypertension Father    Heart attack Father    Heart disease Father    Obesity Father    Hypertension Brother  Healthy Brother    Healthy Brother    Healthy Brother    Kidney disease Neg Hx    Prostate cancer Neg Hx    Bladder Cancer Neg Hx    Colon cancer Neg Hx     Social History   Socioeconomic History   Marital status: Married    Spouse name: Not on file   Number of children: Not on file   Years of education: 14   Highest education level: Associate degree: occupational, scientist, product/process development, or vocational program  Occupational History   Occupation: CAD Copywriter, Advertising: [employer]  Tobacco Use   Smoking status: Never   Smokeless tobacco: Never  Vaping Use   Vaping status: Never Used  Substance and Sexual Activity   Alcohol use: Yes    Alcohol/week: 2.0 standard drinks of alcohol    Types: 1 Glasses of wine, 1 Cans of beer per week    Comment: occasionally   Drug use: No    Sexual activity: Yes    Birth control/protection: Condom  Other Topics Concern   Not on file  Social History Narrative   Regular exercise-no   Caffeine  Use-yes   Social Drivers of Health   Tobacco Use: Low Risk (11/05/2024)   Patient History    Smoking Tobacco Use: Never    Smokeless Tobacco Use: Never    Passive Exposure: Not on file  Financial Resource Strain: Low Risk (10/13/2024)   Overall Financial Resource Strain (CARDIA)    Difficulty of Paying Living Expenses: Not hard at all  Food Insecurity: No Food Insecurity (10/13/2024)   Epic    Worried About Programme Researcher, Broadcasting/film/video in the Last Year: Never true    Ran Out of Food in the Last Year: Never true  Transportation Needs: No Transportation Needs (10/13/2024)   Epic    Lack of Transportation (Medical): No    Lack of Transportation (Non-Medical): No  Physical Activity: Insufficiently Active (10/13/2024)   Exercise Vital Sign    Days of Exercise per Week: 2 days    Minutes of Exercise per Session: 20 min  Stress: No Stress Concern Present (10/13/2024)   [employer] of Occupational Health - Occupational Stress Questionnaire    Feeling of Stress: Only a little  Social Connections: Moderately Isolated (10/13/2024)   Social Connection and Isolation Panel    Frequency of Communication with Friends and Family: More than three times a week    Frequency of Social Gatherings with Friends and Family: Twice a week    Attends Religious Services: Never    Database Administrator or Organizations: No    Attends Engineer, Structural: Not on file    Marital Status: Married  Intimate Partner Violence: Unknown (03/17/2022)   Received from [facility]   HITS    Physically Hurt: Not on file    Insult or Talk Down To: Not on file    Threaten Physical Harm: Not on file    Scream or Curse: Not on file  Depression (PHQ2-9): Low Risk (10/13/2024)   Depression (PHQ2-9)    PHQ-2 Score: 0  Alcohol Screen: Low Risk (10/13/2024)   Alcohol  Screen    Last Alcohol Screening Score (AUDIT): 4  Housing: Low Risk (10/13/2024)   Epic    Unable to Pay for Housing in the Last Year: No    Number of Times Moved in the Last Year: 0    Homeless in the Last Year: No  Utilities: Not on  file  Health Literacy: Not on file     Constitutional: Patient reports intermittent headaches.  Denies fever, malaise, fatigue, or abrupt weight changes.  Respiratory: Denies difficulty breathing, shortness of breath, cough or sputum production.   Cardiovascular: Denies chest pain, chest tightness, palpitations or swelling in the hands or feet.  Gastrointestinal: Denies abdominal pain, bloating, constipation, diarrhea or blood in the stool.  Musculoskeletal: Patient reports joint pain in knees, right elbow and left hand pain.  Denies decrease in range of motion, difficulty with gait, muscle pain or joint swelling.  Skin: Denies redness, rashes, lesions or ulcercations.  Neurological: Denies dizziness, difficulty with memory, difficulty with speech or problems with balance and coordination.    No other specific complaints in a complete review of systems (except as listed in HPI above).  Objective:   Physical Exam  BP 120/74 (BP Location: Right Arm, Patient Position: Sitting, Cuff Size: Large)   Ht 5' 11 (1.803 m)   Wt 288 lb (130.6 kg)   BMI 40.17 kg/m      Wt Readings from Last 3 Encounters:  11/05/24 283 lb (128.4 kg)  10/13/24 280 lb (127 kg)  09/17/24 266 lb (120.7 kg)    General: Appears his stated age, obese in NAD. Skin: Warm, dry and intact.  Cardiovascular: Normal rate and rhythm. S1,S2 noted.  No murmur, rubs or gallops noted.  Radial pulse 2+ on the left. Pulmonary/Chest: Normal effort and positive vesicular breath sounds. No respiratory distress. No wheezes, rales or ronchi noted.  Musculoskeletal: Normal flexion, extension and rotation of the left wrist.  He has pain with ulnar and radial deviation.  No pain with palpation of  the carpals or the metacarpals.  No joint swelling noted.  Strength 5/5 BUE/BLE.  Neurological: Alert and oriented.  Negative Phalen's.  Negative Tinel's.  Coordination normal.    BMET    Component Value Date/Time   NA 140 10/13/2024 1511   NA 142 09/17/2024 1705   K 4.2 10/13/2024 1511   CL 102 10/13/2024 1511   CO2 29 10/13/2024 1511   GLUCOSE 98 10/13/2024 1511   BUN 17 10/13/2024 1511   BUN 18 09/17/2024 1705   CREATININE 0.87 10/13/2024 1511   CALCIUM  9.6 10/13/2024 1511   GFRNONAA >60 06/11/2018 1018   GFRAA >60 06/11/2018 1018    Lipid Panel     Component Value Date/Time   CHOL 139 10/13/2024 1511   TRIG 89 10/13/2024 1511   HDL 58 10/13/2024 1511   CHOLHDL 2.4 10/13/2024 1511   VLDL 19.8 03/26/2020 0849   LDLCALC 64 10/13/2024 1511    CBC    Component Value Date/Time   WBC 9.4 10/13/2024 1511   RBC 5.02 10/13/2024 1511   HGB 14.3 10/13/2024 1511   HGB 14.8 12/07/2017 0847   HCT 43.0 10/13/2024 1511   HCT 43.4 12/07/2017 0847   PLT 285 10/13/2024 1511   MCV 85.7 10/13/2024 1511   MCH 28.5 10/13/2024 1511   MCHC 33.3 10/13/2024 1511   RDW 13.3 10/13/2024 1511   LYMPHSABS 2.2 12/30/2020 1600   MONOABS 0.5 12/30/2020 1600   EOSABS 0.3 12/30/2020 1600   BASOSABS 0.1 12/30/2020 1600    Hgb A1C Lab Results  Component Value Date   HGBA1C 5.9 (H) 10/13/2024           Assessment & Plan:  Assessment and Plan    Chronic left hand pain Chronic pain in left hand, primarily thumb to wrist, sharp and deep. Differential includes  tendinitis and arthritis. Previous arthritis diagnosis without imaging confirmation. - Ordered x-ray of left hand to assess for arthritis. - Prescribed 9-day prednisone  taper. - Restarted Celebrex  200 mg daily for pain management, avoid ibuprofen use while taking Celebrex . - Consider referral to hand specialist if x-ray shows no arthritis.  Type 2 diabetes mellitus Managed with metformin . Discussion about potential switch to  Mounjaro deferred until after physical examination. - Continue metformin  500 mg daily for glucose control. - Reassess diabetes management plan after physical examination and workup.      RTC in 4 months for annual exam Angeline Laura, NP   "

## 2024-12-24 NOTE — Patient Instructions (Signed)
Hand Pain Hand pain can make it hard to do daily activities. Many things can cause hand pain. Some common causes are: Injuries. These may include: Broken bones (fractures)and cuts. Overuse injuries from doing the same movements many times (repetitive activity). Arthritis. Lumps in the tendons or joints of the hand and wrist (ganglion cysts). Nerve compression syndromes (carpal tunnel syndrome). Inflammation of the tendons (tendinitis). Infection. Follow these instructions at home: Managing pain, stiffness, and swelling     Take over-the-counter and prescription medicines only as told by your health care provider. If told, put ice on the affected area. Put ice in a plastic bag. Place a towel between your skin and the bag. Leave the ice on for 20 minutes, 2-3 times a day. If told, apply heat to the affected area before you exercise or as often as told by your provider. Use the heat source that your provider recommends, such as a moist heat pack or a heating pad. Place a towel between your skin and the heat source. Leave the heat on for 20-30 minutes. If your skin turns bright red, remove the ice or heat right away to prevent skin damage. The risk of damage is higher if you cannot feel pain, heat, or cold. Activity Take breaks from repetitive activity often. Minimize stress on your hands and wrists as much as possible. Do stretches or exercises as told by your provider. Do not do activities that make your pain worse. Wear a hand splint or support as told by your provider. Contact a health care provider if: Your pain does not get better after a few days. Your pain gets worse. Your pain affects your ability to do your daily activities. Your hand becomes warm, red, or swollen. Your hand is numb or tingling. Get help right away if: Your hand is extremely swollen or is an unusual shape. Your hand or fingers turn white or blue. You cannot move your hand, wrist, or fingers. This  information is not intended to replace advice given to you by your health care provider. Make sure you discuss any questions you have with your health care provider. Document Revised: 07/05/2022 Document Reviewed: 07/05/2022 Elsevier Patient Education  2024 Elsevier Inc.  

## 2025-01-04 ENCOUNTER — Other Ambulatory Visit: Payer: Self-pay | Admitting: Internal Medicine

## 2025-01-04 DIAGNOSIS — Z0000 Encounter for general adult medical examination without abnormal findings: Secondary | ICD-10-CM

## 2025-01-05 NOTE — Telephone Encounter (Signed)
 Requested Prescriptions  Pending Prescriptions Disp Refills   lisinopril -hydrochlorothiazide  (ZESTORETIC ) 10-12.5 MG tablet [Pharmacy Med Name: LISINOPRIL -HCTZ 10-12.5 MG TAB] 90 tablet 1    Sig: TAKE 1 TABLET BY MOUTH EVERY DAY     Cardiovascular:  ACEI + Diuretic Combos Passed - 01/05/2025  1:52 PM      Passed - Na in normal range and within 180 days    Sodium  Date Value Ref Range Status  10/13/2024 140 135 - 146 mmol/L Final  09/17/2024 142 134 - 144 mmol/L Final         Passed - K in normal range and within 180 days    Potassium  Date Value Ref Range Status  10/13/2024 4.2 3.5 - 5.3 mmol/L Final         Passed - Cr in normal range and within 180 days    Creat  Date Value Ref Range Status  10/13/2024 0.87 0.70 - 1.30 mg/dL Final   Creatinine, Urine  Date Value Ref Range Status  10/13/2024 129 20 - 320 mg/dL Final         Passed - eGFR is 30 or above and within 180 days    GFR calc Af Amer  Date Value Ref Range Status  06/11/2018 >60 >60 mL/min Final    Comment:    (NOTE) The eGFR has been calculated using the CKD EPI equation. This calculation has not been validated in all clinical situations. eGFR's persistently <60 mL/min signify possible Chronic Kidney Disease.    GFR calc non Af Amer  Date Value Ref Range Status  06/11/2018 >60 >60 mL/min Final   GFR  Date Value Ref Range Status  12/30/2020 74.24 >60.00 mL/min Final    Comment:    Calculated using the CKD-EPI Creatinine Equation (2021)   eGFR  Date Value Ref Range Status  10/13/2024 103 > OR = 60 mL/min/1.26m2 Final  09/17/2024 91 >59 mL/min/1.73 Final         Passed - Patient is not pregnant      Passed - Last BP in normal range    BP Readings from Last 1 Encounters:  12/24/24 120/74         Passed - Valid encounter within last 6 months    Recent Outpatient Visits           1 week ago Chronic hand pain, left   [place] [facility] [place], [place] W, NP   2 months ago Type  2 diabetes mellitus with hyperglycemia, without long-term current use of insulin  [facility])   [place] [facility] [place], [place] W, NP   7 months ago Acute midline low back pain with right-sided sciatica   [place] [facility] [place], [place]N, NP   8 months ago Encounter for general adult medical examination with abnormal findings   [place] [facility] [place], [place] ORN, NP       Future Appointments             In 2 months Francisca, Redell BROCKS, MD [facility] [place]             nortriptyline  (PAMELOR ) 75 MG capsule [Pharmacy Med Name: NORTRIPTYLINE  HCL 75 MG CAP] 90 capsule 1    Sig: TAKE 1 CAPSULE BY MOUTH AT BEDTIME.     Psychiatry:  Antidepressants - Heterocyclics (TCAs) Passed - 01/05/2025  1:52 PM      Passed - Valid encounter within last 6 months  Recent Outpatient Visits           1 week ago Chronic hand pain, left   [facility] [place], [place] W, NP   2 months ago Type 2 diabetes mellitus with hyperglycemia, without long-term current use of insulin  [facility])   [place] [facility] [place], [place] W, NP   7 months ago Acute midline low back pain with right-sided sciatica   [place] [facility] [place], [place] ORN, NP   8 months ago Encounter for general adult medical examination with abnormal findings   [place] [facility] [place], [place] ORN, NP       Future Appointments             In 2 months Francisca, Redell BROCKS, MD [facility] [place]

## 2025-03-31 ENCOUNTER — Ambulatory Visit: Admitting: Urology

## 2025-04-14 ENCOUNTER — Encounter: Admitting: Internal Medicine

## 2025-09-15 ENCOUNTER — Other Ambulatory Visit (HOSPITAL_COMMUNITY)
